# Patient Record
Sex: Female | Born: 1935 | Race: White | Hispanic: No | Smoking: Never smoker
Health system: Southern US, Community
[De-identification: ages and names within clinical notes are randomized; demographics above are authoritative.]

## PROBLEM LIST (undated history)

## (undated) DIAGNOSIS — IMO0002 Reserved for concepts with insufficient information to code with codable children: Secondary | ICD-10-CM

## (undated) DIAGNOSIS — F419 Anxiety disorder, unspecified: Secondary | ICD-10-CM

## (undated) DIAGNOSIS — G479 Sleep disorder, unspecified: Secondary | ICD-10-CM

## (undated) DIAGNOSIS — K5900 Constipation, unspecified: Secondary | ICD-10-CM

## (undated) DIAGNOSIS — K219 Gastro-esophageal reflux disease without esophagitis: Secondary | ICD-10-CM

## (undated) DIAGNOSIS — F209 Schizophrenia, unspecified: Secondary | ICD-10-CM

## (undated) DIAGNOSIS — N838 Other noninflammatory disorders of ovary, fallopian tube and broad ligament: Secondary | ICD-10-CM

## (undated) DIAGNOSIS — K900 Celiac disease: Secondary | ICD-10-CM

## (undated) DIAGNOSIS — I499 Cardiac arrhythmia, unspecified: Secondary | ICD-10-CM

## (undated) DIAGNOSIS — I10 Essential (primary) hypertension: Secondary | ICD-10-CM

## (undated) DIAGNOSIS — F0390 Unspecified dementia without behavioral disturbance: Secondary | ICD-10-CM

## (undated) DIAGNOSIS — F54 Psychological and behavioral factors associated with disorders or diseases classified elsewhere: Secondary | ICD-10-CM

## (undated) DIAGNOSIS — E039 Hypothyroidism, unspecified: Secondary | ICD-10-CM

## (undated) DIAGNOSIS — M8580 Other specified disorders of bone density and structure, unspecified site: Secondary | ICD-10-CM

## (undated) DIAGNOSIS — R631 Polydipsia: Secondary | ICD-10-CM

## (undated) HISTORY — DX: Essential (primary) hypertension: I10

## (undated) HISTORY — PX: VERTEBROPLASTY: SHX113

## (undated) HISTORY — DX: Celiac disease: K90.0

## (undated) HISTORY — DX: Reserved for concepts with insufficient information to code with codable children: IMO0002

## (undated) HISTORY — PX: OTHER SURGICAL HISTORY: SHX169

## (undated) HISTORY — DX: Anxiety disorder, unspecified: F41.9

## (undated) HISTORY — DX: Other specified disorders of bone density and structure, unspecified site: M85.80

## (undated) HISTORY — PX: THYROIDECTOMY: SHX17

---

## 2001-06-14 ENCOUNTER — Emergency Department (HOSPITAL_COMMUNITY): Admission: EM | Admit: 2001-06-14 | Discharge: 2001-06-14 | Payer: Self-pay | Admitting: Internal Medicine

## 2001-06-14 ENCOUNTER — Encounter: Payer: Self-pay | Admitting: *Deleted

## 2005-09-29 ENCOUNTER — Emergency Department (HOSPITAL_COMMUNITY): Admission: EM | Admit: 2005-09-29 | Discharge: 2005-09-29 | Payer: Self-pay | Admitting: Emergency Medicine

## 2005-10-02 ENCOUNTER — Ambulatory Visit (HOSPITAL_COMMUNITY): Admission: RE | Admit: 2005-10-02 | Discharge: 2005-10-02 | Payer: Self-pay | Admitting: Family Medicine

## 2005-10-03 ENCOUNTER — Encounter (INDEPENDENT_AMBULATORY_CARE_PROVIDER_SITE_OTHER): Payer: Self-pay | Admitting: *Deleted

## 2005-10-03 ENCOUNTER — Ambulatory Visit (HOSPITAL_COMMUNITY): Admission: RE | Admit: 2005-10-03 | Discharge: 2005-10-03 | Payer: Self-pay | Admitting: Interventional Radiology

## 2005-11-04 ENCOUNTER — Encounter: Payer: Self-pay | Admitting: Interventional Radiology

## 2009-04-13 ENCOUNTER — Ambulatory Visit (HOSPITAL_COMMUNITY): Admission: RE | Admit: 2009-04-13 | Discharge: 2009-04-13 | Payer: Self-pay | Admitting: Internal Medicine

## 2010-11-06 ENCOUNTER — Emergency Department (HOSPITAL_COMMUNITY): Admission: EM | Admit: 2010-11-06 | Discharge: 2010-11-06 | Payer: Self-pay | Admitting: Emergency Medicine

## 2011-03-05 ENCOUNTER — Emergency Department (HOSPITAL_COMMUNITY): Payer: Medicare Other

## 2011-03-05 ENCOUNTER — Emergency Department (HOSPITAL_COMMUNITY)
Admission: EM | Admit: 2011-03-05 | Discharge: 2011-03-05 | Disposition: A | Discharge status: Home / Self Care | Payer: Medicare Other | Attending: Emergency Medicine | Admitting: Emergency Medicine

## 2011-03-05 DIAGNOSIS — Y92410 Unspecified street and highway as the place of occurrence of the external cause: Secondary | ICD-10-CM | POA: Insufficient documentation

## 2011-03-05 DIAGNOSIS — Z79899 Other long term (current) drug therapy: Secondary | ICD-10-CM | POA: Insufficient documentation

## 2011-03-05 DIAGNOSIS — S42293A Other displaced fracture of upper end of unspecified humerus, initial encounter for closed fracture: Secondary | ICD-10-CM | POA: Insufficient documentation

## 2011-03-05 DIAGNOSIS — E039 Hypothyroidism, unspecified: Secondary | ICD-10-CM | POA: Insufficient documentation

## 2011-03-05 DIAGNOSIS — I10 Essential (primary) hypertension: Secondary | ICD-10-CM | POA: Insufficient documentation

## 2011-03-05 DIAGNOSIS — W1809XA Striking against other object with subsequent fall, initial encounter: Secondary | ICD-10-CM | POA: Insufficient documentation

## 2011-03-07 ENCOUNTER — Encounter: Payer: Self-pay | Admitting: Orthopedic Surgery

## 2011-03-07 ENCOUNTER — Ambulatory Visit (INDEPENDENT_AMBULATORY_CARE_PROVIDER_SITE_OTHER): Payer: Medicare Other | Admitting: Orthopedic Surgery

## 2011-03-07 VITALS — HR 78 | Temp 98.7°F | Resp 16

## 2011-03-07 DIAGNOSIS — S42213A Unspecified displaced fracture of surgical neck of unspecified humerus, initial encounter for closed fracture: Secondary | ICD-10-CM

## 2011-03-07 NOTE — Patient Instructions (Signed)
Wear sling until you come back

## 2011-03-08 ENCOUNTER — Encounter: Payer: Self-pay | Admitting: Orthopedic Surgery

## 2011-03-08 NOTE — Progress Notes (Signed)
C/o right shoulder pain   77 yo female fell while walking, c/o mild pain in the right shoulder which is non radiating and described as dull ache  Past Medical History  Diagnosis Date  . Thyroid disease   . HTN (hypertension)   . Reflux   . Anxiety   . Osteopenia     Past Surgical History  Procedure Date  . Left shoulder   . Right hip replacement   . Thyroidectomy   . Vertebroplasty     Family History  Problem Relation Age of Onset  . Cancer    . Kidney disease       General: The patient is normally developed, with normal grooming and hygiene. There are no gross deformities. The body habitus is small CDV: The pulse and perfusion of the extremities are normal   LYMPH: There is no gross lymphadenopathy in the extremities   Skin: There are no rashes, ulcers or cafe-au-lait spot   Psyche: The patient is alert, awake and oriented.  Mood is normal   Neuro:  The coordination and balance are normal.  Sensation is normal. Reflexes are 2+ and equal   Musculoskeletal   Right upper extremity Tender prox humerus  Swelling prox hum   Ecchymosis skin prox hum   Elbow wrist and hand rom strength normal   xrays + CT ND proximal humerus fracture   PLAN sling x 2 weeks then xrays then PT

## 2011-03-12 ENCOUNTER — Other Ambulatory Visit: Payer: Self-pay | Admitting: *Deleted

## 2011-03-12 ENCOUNTER — Telehealth: Payer: Self-pay | Admitting: Internal Medicine

## 2011-03-12 DIAGNOSIS — R52 Pain, unspecified: Secondary | ICD-10-CM

## 2011-03-12 MED ORDER — HYDROCODONE-ACETAMINOPHEN 5-325 MG PO TABS: 0.5000 | ORAL_TABLET | ORAL | Status: DC | PRN

## 2011-03-12 NOTE — Telephone Encounter (Signed)
Forwarded to Dr.

## 2011-03-12 NOTE — Telephone Encounter (Signed)
Give 1/2 tab norco q4 hrs prn pain

## 2011-03-12 NOTE — Telephone Encounter (Addendum)
Daughter called in and states that they have weaned her off the Norco that the hospital had her on.  The patient did use Tylenol yesterday but woke up this morning and was hurting in her shoulder and knee.  Daughter wanted to know if something else could be called in for pain to [place].  Please call daughter at home at [phone] you may leave a message on her machine.

## 2011-03-12 NOTE — Telephone Encounter (Signed)
Called pain med into [facility]

## 2011-03-13 ENCOUNTER — Encounter: Payer: Self-pay | Admitting: Orthopedic Surgery

## 2011-03-26 ENCOUNTER — Ambulatory Visit (INDEPENDENT_AMBULATORY_CARE_PROVIDER_SITE_OTHER): Payer: Medicare Other | Admitting: Orthopedic Surgery

## 2011-03-26 DIAGNOSIS — S42309A Unspecified fracture of shaft of humerus, unspecified arm, initial encounter for closed fracture: Secondary | ICD-10-CM

## 2011-03-26 NOTE — Progress Notes (Signed)
F/U fracture right proximal humerus   DOI 03/05/2011  X-rays due   Treatment sling   Subjectives: "It feels better"   Exam: The hand and wrist function is normal and the radial pulse is normal   X-rays show healed fracture proximal humerus   Start PT   F/u in 8 weeks

## 2011-04-01 ENCOUNTER — Ambulatory Visit (HOSPITAL_COMMUNITY)
Admission: RE | Admit: 2011-04-01 | Discharge: 2011-04-01 | Disposition: A | Discharge status: Home / Self Care | Payer: Medicare Other | Source: Ambulatory Visit | Attending: Specialist | Admitting: Specialist

## 2011-04-01 DIAGNOSIS — M25519 Pain in unspecified shoulder: Secondary | ICD-10-CM | POA: Insufficient documentation

## 2011-04-01 DIAGNOSIS — I10 Essential (primary) hypertension: Secondary | ICD-10-CM | POA: Insufficient documentation

## 2011-04-01 DIAGNOSIS — M25619 Stiffness of unspecified shoulder, not elsewhere classified: Secondary | ICD-10-CM | POA: Insufficient documentation

## 2011-04-01 DIAGNOSIS — IMO0001 Reserved for inherently not codable concepts without codable children: Secondary | ICD-10-CM | POA: Insufficient documentation

## 2011-04-01 DIAGNOSIS — M6281 Muscle weakness (generalized): Secondary | ICD-10-CM | POA: Insufficient documentation

## 2011-04-05 ENCOUNTER — Ambulatory Visit (HOSPITAL_COMMUNITY)
Admission: RE | Admit: 2011-04-05 | Discharge: 2011-04-05 | Disposition: A | Discharge status: Home / Self Care | Payer: Medicare Other | Source: Ambulatory Visit | Attending: Internal Medicine | Admitting: Internal Medicine

## 2011-04-09 ENCOUNTER — Ambulatory Visit (HOSPITAL_COMMUNITY)
Admission: RE | Admit: 2011-04-09 | Discharge: 2011-04-09 | Disposition: A | Discharge status: Home / Self Care | Payer: Medicare Other | Source: Ambulatory Visit | Attending: Internal Medicine | Admitting: Internal Medicine

## 2011-04-09 DIAGNOSIS — M25519 Pain in unspecified shoulder: Secondary | ICD-10-CM | POA: Insufficient documentation

## 2011-04-09 DIAGNOSIS — IMO0001 Reserved for inherently not codable concepts without codable children: Secondary | ICD-10-CM | POA: Insufficient documentation

## 2011-04-09 DIAGNOSIS — M6281 Muscle weakness (generalized): Secondary | ICD-10-CM | POA: Insufficient documentation

## 2011-04-09 DIAGNOSIS — I10 Essential (primary) hypertension: Secondary | ICD-10-CM | POA: Insufficient documentation

## 2011-04-09 DIAGNOSIS — M25619 Stiffness of unspecified shoulder, not elsewhere classified: Secondary | ICD-10-CM | POA: Insufficient documentation

## 2011-04-11 ENCOUNTER — Ambulatory Visit (HOSPITAL_COMMUNITY)
Admission: RE | Admit: 2011-04-11 | Discharge: 2011-04-11 | Disposition: A | Discharge status: Home / Self Care | Payer: Medicare Other | Source: Ambulatory Visit | Attending: Internal Medicine | Admitting: Internal Medicine

## 2011-04-18 ENCOUNTER — Ambulatory Visit (HOSPITAL_COMMUNITY)
Admission: RE | Admit: 2011-04-18 | Discharge: 2011-04-18 | Disposition: A | Discharge status: Home / Self Care | Payer: Medicare Other | Source: Ambulatory Visit | Attending: Internal Medicine | Admitting: Internal Medicine

## 2011-04-19 ENCOUNTER — Ambulatory Visit (HOSPITAL_COMMUNITY)
Admission: RE | Admit: 2011-04-19 | Discharge: 2011-04-19 | Disposition: A | Discharge status: Home / Self Care | Payer: Medicare Other | Source: Ambulatory Visit | Attending: Internal Medicine | Admitting: Internal Medicine

## 2011-04-23 ENCOUNTER — Ambulatory Visit (HOSPITAL_COMMUNITY)
Admission: RE | Admit: 2011-04-23 | Discharge: 2011-04-23 | Disposition: A | Discharge status: Home / Self Care | Payer: Medicare Other | Source: Ambulatory Visit | Attending: Internal Medicine | Admitting: Internal Medicine

## 2011-04-25 ENCOUNTER — Ambulatory Visit (HOSPITAL_COMMUNITY)
Admission: RE | Admit: 2011-04-25 | Discharge: 2011-04-25 | Disposition: A | Discharge status: Home / Self Care | Payer: Medicare Other | Source: Ambulatory Visit | Attending: Internal Medicine | Admitting: Internal Medicine

## 2011-04-26 NOTE — Consult Note (Signed)
Felicia Harvey, Felicia Harvey NO.:  [id number]   MEDICAL RECORD NO.:  [id number]          PATIENT TYPE:  OUT   LOCATION:  XRAY                         FACILITY:  [facility]   PHYSICIAN:  Sanjeev K. Deveshwar, M.D.DATE OF BIRTH:  13-Jul-1929   DATE OF CONSULTATION:  11/04/2005  DATE OF DISCHARGE:                                   CONSULTATION   BRIEF HISTORY:  This is a very pleasant 77 year old female who lives in  [place] but was here in [place] visiting her daughter. She fell while  vacuuming and developed an L1 compression fracture. She was referred to Dr.  Corliss Skains for further evaluation and treatment. A CT scan was performed. Dr.  Corliss Skains reviewed the CT scan and felt that the patient had an L1  compression fracture that would be amendable to kyphoplasty. The patient  underwent the kyphoplasty procedure at the L1 level performed on October 03, 2005 for relief of pain and stabilization of the fracture. The patient  tolerated the procedure well. The biopsy report was negative for malignancy.  The patient returns today, November 04, 2005 to be seen in follow-up prior  to returning to [place].   PAST MEDICAL HISTORY:  Significant for hypertension, irregular heart rate,  hypothyroidism, depression and history of headaches.   SURGICAL HISTORY:  Significant for thyroid surgery, left shoulder surgery,  left hip replacement surgery, multiple fractures including her sternum and  clavicle.   ALLERGIES:  No known drug allergies.   CURRENT MEDICATIONS:  Paxil, lisinopril, Synthroid, Cardizem and aspirin or  Tylenol p.r.n. for pain. She previously was taking hydrocodone for pain.   SOCIAL HISTORY:  The patient is divorced. She has three children. She lives  in [place], [place]. Her son lives with her. She has never used alcohol or  tobacco. 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   FAMILY HISTORY:  Mother died in her 30s from  unknown causes. Her father died  in his 59s from kidney disease.   IMPRESSION AND PLAN:  As noted, this patient returns approximately one month  following a kyphoplasty at the lumbar vertebrae-1 level performed by Dr.  Corliss Skains. She is not having any significant pain in her back at all at this  time. She does have some stiffness and pain in her legs. She takes p.r.n.  Tylenol or aspirin which does relieve the pain. She ambulates with a cane at  times although at other times she is able to ambulate independently. She  feels her mobility is back to normal. She was cautioned not to lift any  heavy objects and to avoid bending, stooping and  twisting as much as possible to prevent further compression fractures. She  is doing very well at this time and plans to return to [place] tomorrow. Her  wounds have healed without difficulty.   Approximately 15 minutes was spent on this consult today.      Delton See, P.A.    ______________________________  Grandville Silos. Corliss Skains, M.D.    DR/MEDQ  D:  11/04/2005  T:  11/05/2005  Job:  [id number]   cc:   Quita Skye. Artis Flock, M.D.  Fax: [phone]   Ruby Cola, M.D.  [place], [place]

## 2011-04-26 NOTE — Consult Note (Signed)
NAMEJOSE, ALLEYNE NO.:  [id number]   MEDICAL RECORD NO.:  [id number]          PATIENT TYPE:  OUT   LOCATION:  CATS                         FACILITY:  [facility]   PHYSICIAN:  Delton See, P.A.   DATE OF BIRTH:  December 04, 1929   DATE OF CONSULTATION:  10/02/2005  DATE OF DISCHARGE:                                   CONSULTATION   CHIEF COMPLAINT:  Compression fracture.   BRIEF HISTORY:  This is a 77 year old female who lives in [place] but is here  in [place], [place], visiting her daughters.  She fell  approximately three weeks ago at her daughters home while vacuuming.  She  states that she just tripped and fell.  She developed back pain.  She was  seen at the [facility] Room on September 29, 2005, and had spine  films that day that revealed an L1 compression fracture along with scoliosis  and MRI was recommended.  The patient was referred to Dr. Corliss Skains for  further evaluation, she was seen today for that consultation.  She describes  pain that radiates down her right leg and across her low back.  She had a CT  scan performed today in radiology.  She is not able to have an MRI due to a  prosthetic hip joint.  The CT scan confirmed the L1 compression fracture.   PAST MEDICAL HISTORY:  Significant for hypertension, history of an irregular  heart rhythm, history of hypothyroidism, history of depression.  She had  headaches earlier this year for about three days that subsequently resolved.   PAST SURGICAL HISTORY:  Significant for thyroid surgery, left shoulder  surgery, left hip replacement surgery.  She has also had multiple fractures  including her sternum and clavicle.   ALLERGIES:  No known drug allergies.   CURRENT MEDICATIONS:  Paxil 10 mg 1/2 tablet daily, Lisinopril 40 mg daily,  Synthroid 100 mcg daily, Cardizem CD 240 mg daily, hydrocodone p.r.n. for  pain.   SOCIAL HISTORY:  The patient is divorced, she has three children.  She  lives  in [place], [place].  Her son lives with her.  She has never used alcohol or  tobacco.  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   FAMILY HISTORY:  Her mother died in her 21s of unknown causes.  Her father  died in his 15s for kidney disease.   IMPRESSION:  1.  L1 compression fracture.  2.  Hypertension.  3.  History of irregular heart rate.  4.  Status post multiple surgeries as noted above.  5.  Status post multiple fractures.  6.  History of celiac disease.  7.  Hypothyroidism.  8.  History of depression.   PLAN:  Dr. Corliss Skains discussed the kyphoplasty with the patient and her  daughter who accompanied her to the visit today.  The details of the  procedure along with the risks and benefits were discussed.  The CT scan  results were reviewed.  The patient and her daughter are anxious  to proceed  for pain relief and stabilization of the fracture.  We will try to schedule  her procedure for tomorrow as the patient is anxious to return to [place].  Greater than 30 minutes was spent on this consult today.      Delton See, P.A.     DR/MEDQ  D:  10/02/2005  T:  10/02/2005  Job:  [id number]   cc:   Quita Skye. Artis Flock, M.D.  Fax: [phone]   Ruby Cola, M.D.  [place], [place]

## 2011-04-30 ENCOUNTER — Ambulatory Visit (HOSPITAL_COMMUNITY)
Admission: RE | Admit: 2011-04-30 | Discharge: 2011-04-30 | Disposition: A | Discharge status: Home / Self Care | Payer: Medicare Other | Source: Ambulatory Visit | Attending: Internal Medicine | Admitting: Internal Medicine

## 2011-05-02 ENCOUNTER — Ambulatory Visit (HOSPITAL_COMMUNITY)
Admission: RE | Admit: 2011-05-02 | Discharge: 2011-05-02 | Disposition: A | Discharge status: Home / Self Care | Payer: Medicare Other | Source: Ambulatory Visit | Attending: Internal Medicine | Admitting: Internal Medicine

## 2011-05-07 ENCOUNTER — Ambulatory Visit (HOSPITAL_COMMUNITY)
Admission: RE | Admit: 2011-05-07 | Discharge: 2011-05-07 | Disposition: A | Discharge status: Home / Self Care | Payer: Medicare Other | Source: Ambulatory Visit | Attending: Internal Medicine | Admitting: Internal Medicine

## 2011-05-09 ENCOUNTER — Ambulatory Visit (HOSPITAL_COMMUNITY)
Admission: RE | Admit: 2011-05-09 | Discharge: 2011-05-09 | Disposition: A | Discharge status: Home / Self Care | Payer: Medicare Other | Source: Ambulatory Visit | Attending: Internal Medicine | Admitting: Internal Medicine

## 2011-05-21 ENCOUNTER — Ambulatory Visit (INDEPENDENT_AMBULATORY_CARE_PROVIDER_SITE_OTHER): Payer: Medicare Other | Admitting: Orthopedic Surgery

## 2011-05-21 DIAGNOSIS — S42213A Unspecified displaced fracture of surgical neck of unspecified humerus, initial encounter for closed fracture: Secondary | ICD-10-CM

## 2011-05-21 NOTE — Progress Notes (Signed)
F/U fracture right proximal humerus  DOI 03/05/2011  She is completed physical therapy she still has limitations in her range of motion including activities of daily living however she does not want to continue any therapy because of the cost and she prefers to do her own home program She Limited in forward elevation to 90  So this point recommend she go ahead and continue exercising at home any problems she can come in for repeat evaluation

## 2011-09-20 ENCOUNTER — Other Ambulatory Visit: Payer: Self-pay

## 2011-09-20 ENCOUNTER — Encounter (HOSPITAL_COMMUNITY): Payer: Self-pay | Admitting: Family Medicine

## 2011-09-20 ENCOUNTER — Emergency Department (HOSPITAL_COMMUNITY): Payer: Medicare Other

## 2011-09-20 ENCOUNTER — Inpatient Hospital Stay (HOSPITAL_COMMUNITY)
Admission: EM | Admit: 2011-09-20 | Discharge: 2011-09-23 | DRG: 536 | Disposition: A | Discharge status: Home Health | Payer: Medicare Other | Attending: Internal Medicine | Admitting: Internal Medicine

## 2011-09-20 DIAGNOSIS — R51 Headache: Secondary | ICD-10-CM

## 2011-09-20 DIAGNOSIS — S32309A Unspecified fracture of unspecified ilium, initial encounter for closed fracture: Principal | ICD-10-CM | POA: Diagnosis present

## 2011-09-20 DIAGNOSIS — F54 Psychological and behavioral factors associated with disorders or diseases classified elsewhere: Secondary | ICD-10-CM

## 2011-09-20 DIAGNOSIS — R079 Chest pain, unspecified: Secondary | ICD-10-CM | POA: Diagnosis not present

## 2011-09-20 DIAGNOSIS — Y998 Other external cause status: Secondary | ICD-10-CM

## 2011-09-20 DIAGNOSIS — K5900 Constipation, unspecified: Secondary | ICD-10-CM | POA: Diagnosis present

## 2011-09-20 DIAGNOSIS — K219 Gastro-esophageal reflux disease without esophagitis: Secondary | ICD-10-CM

## 2011-09-20 DIAGNOSIS — F329 Major depressive disorder, single episode, unspecified: Secondary | ICD-10-CM | POA: Diagnosis present

## 2011-09-20 DIAGNOSIS — E222 Syndrome of inappropriate secretion of antidiuretic hormone: Secondary | ICD-10-CM | POA: Diagnosis present

## 2011-09-20 DIAGNOSIS — R52 Pain, unspecified: Secondary | ICD-10-CM

## 2011-09-20 DIAGNOSIS — R519 Headache, unspecified: Secondary | ICD-10-CM | POA: Diagnosis present

## 2011-09-20 DIAGNOSIS — I10 Essential (primary) hypertension: Secondary | ICD-10-CM

## 2011-09-20 DIAGNOSIS — T148XXA Other injury of unspecified body region, initial encounter: Secondary | ICD-10-CM

## 2011-09-20 DIAGNOSIS — D649 Anemia, unspecified: Secondary | ICD-10-CM

## 2011-09-20 DIAGNOSIS — S329XXA Fracture of unspecified parts of lumbosacral spine and pelvis, initial encounter for closed fracture: Secondary | ICD-10-CM

## 2011-09-20 DIAGNOSIS — R1310 Dysphagia, unspecified: Secondary | ICD-10-CM | POA: Diagnosis present

## 2011-09-20 DIAGNOSIS — Y9239 Other specified sports and athletic area as the place of occurrence of the external cause: Secondary | ICD-10-CM

## 2011-09-20 DIAGNOSIS — W010XXA Fall on same level from slipping, tripping and stumbling without subsequent striking against object, initial encounter: Secondary | ICD-10-CM | POA: Diagnosis present

## 2011-09-20 DIAGNOSIS — W19XXXA Unspecified fall, initial encounter: Secondary | ICD-10-CM

## 2011-09-20 DIAGNOSIS — F32A Depression, unspecified: Secondary | ICD-10-CM

## 2011-09-20 DIAGNOSIS — S72001A Fracture of unspecified part of neck of right femur, initial encounter for closed fracture: Secondary | ICD-10-CM | POA: Diagnosis present

## 2011-09-20 DIAGNOSIS — F3289 Other specified depressive episodes: Secondary | ICD-10-CM | POA: Diagnosis present

## 2011-09-20 DIAGNOSIS — J438 Other emphysema: Secondary | ICD-10-CM | POA: Diagnosis present

## 2011-09-20 DIAGNOSIS — E236 Other disorders of pituitary gland: Secondary | ICD-10-CM | POA: Diagnosis present

## 2011-09-20 DIAGNOSIS — E039 Hypothyroidism, unspecified: Secondary | ICD-10-CM | POA: Diagnosis present

## 2011-09-20 HISTORY — DX: Psychological and behavioral factors associated with disorders or diseases classified elsewhere: F54

## 2011-09-20 HISTORY — DX: Schizophrenia, unspecified: F20.9

## 2011-09-20 HISTORY — DX: Gastro-esophageal reflux disease without esophagitis: K21.9

## 2011-09-20 HISTORY — DX: Polydipsia: R63.1

## 2011-09-20 HISTORY — DX: Hypothyroidism, unspecified: E03.9

## 2011-09-20 LAB — CBC
Hemoglobin: 12.8 g/dL (ref 12.0–15.0)
MCH: 31.2 pg (ref 26.0–34.0)
MCV: 88 fL (ref 78.0–100.0)
RBC: 4.1 MIL/uL (ref 3.87–5.11)
RDW: 12.8 % (ref 11.5–15.5)
WBC: 9.3 10*3/uL (ref 4.0–10.5)

## 2011-09-20 LAB — BASIC METABOLIC PANEL
BUN: 11 mg/dL (ref 6–23)
CO2: 23 mEq/L (ref 19–32)
Calcium: 8.7 mg/dL (ref 8.4–10.5)
Chloride: 94 mEq/L — ABNORMAL LOW (ref 96–112)
Creatinine, Ser: 0.64 mg/dL (ref 0.50–1.10)
GFR calc Af Amer: >90 mL/min (ref >90)
GFR calc non Af Amer: 83 mL/min — ABNORMAL LOW (ref >90)
Glucose, Bld: 113 mg/dL — ABNORMAL HIGH (ref 70–99)
Potassium: 3.5 mEq/L (ref 3.5–5.1)

## 2011-09-20 LAB — DIFFERENTIAL
Basophils Absolute: 0 10*3/uL (ref 0.0–0.1)
Eosinophils Absolute: 0 10*3/uL (ref 0.0–0.7)
Eosinophils Relative: 0 % (ref 0–5)
Lymphocytes Relative: 12 % (ref 12–46)
Lymphs Abs: 1.1 10*3/uL (ref 0.7–4.0)
Monocytes Absolute: 0.6 10*3/uL (ref 0.1–1.0)
Monocytes Relative: 6 % (ref 3–12)
Neutro Abs: 7.6 10*3/uL (ref 1.7–7.7)
Neutrophils Relative %: 81 % — ABNORMAL HIGH (ref 43–77)

## 2011-09-20 LAB — TYPE AND SCREEN
ABO/RH(D): O POS
Antibody Screen: NEGATIVE

## 2011-09-20 MED ORDER — SODIUM CHLORIDE 0.9 % IV SOLN: 1000.0000 mL | INTRAVENOUS | Status: DC

## 2011-09-20 MED ORDER — FENTANYL CITRATE 0.05 MG/ML IJ SOLN
INTRAMUSCULAR | Status: AC
  Administered 2011-09-20: 100 ug
  Filled 2011-09-20: qty 2

## 2011-09-20 MED ORDER — FENTANYL CITRATE 0.05 MG/ML IJ SOLN
100.0000 ug | Freq: Once | INTRAMUSCULAR | Status: AC
  Administered 2011-09-20: 100 ug via INTRAVENOUS
  Filled 2011-09-20: qty 2

## 2011-09-20 MED ORDER — HYDROMORPHONE HCL 1 MG/ML IJ SOLN
1.0000 mg | Freq: Once | INTRAMUSCULAR | Status: AC
  Administered 2011-09-20: 1 mg via INTRAVENOUS
  Filled 2011-09-20: qty 1

## 2011-09-20 NOTE — ED Notes (Signed)
Pt walking in park, slid and fell on leaves and acorns.

## 2011-09-20 NOTE — H&P (Signed)
PCP:   Dwana Melena, MD   Chief Complaint:  Hip pain  HPI: This is a 77 y/o female who was walking in the park when she tripped and fell. It was a unwitnessed fall. She did hit her head, she had no LOC. She reports no chest pain, no palpitation prior to falling. No dizziness. She was found by her son and brought to the ER. The patient reports no cardiac history, she has never had an MI. She walks twice daily a bit over a mile each time. She ambulates using a cane.   She does report that she gets occasional chest pain with activity ~ every 2 weeks. She states she has had this for approximately 4 years. Pain is centrally located, sharp, no radiation. She also reports dysphagia. She states she has to cut her foods very small, especially meat in order to eat without issues. She has to drink carefully. She does get occasional heart burn. Her food can occasionally get hung in her throat. She has not followed up with her doctor about either of these. Her last stress test and surgery were ~ 6 years ago. History obtained from the patient and her son who are present at the bedside.  Review of Systems: (positives bolded) The patient denies anorexia, fever, weight loss,, vision loss, decreased hearing, hoarseness, chest pain, syncope, dyspnea on exertion, peripheral edema, balance deficits, hemoptysis, abdominal pain, melena, hematochezia, severe indigestion/heartburn, hematuria, incontinence, genital sores, muscle weakness, suspicious skin lesions, transient blindness, difficulty walking, depression, unusual weight change, abnormal bleeding, enlarged lymph nodes, angioedema, and breast masses.  Past Medical History: Past Medical History  Diagnosis Date  . Thyroid disease   . HTN (hypertension)   . Reflux   . Anxiety   . Osteopenia    Past Surgical History  Procedure Date  . Left shoulder   . Right hip replacement   . Thyroidectomy   . Vertebroplasty     Medications: Prior to Admission medications    Medication Sig Start Date End Date Taking? Authorizing Provider  Alendronate Sodium (FOSAMAX PO) Take 1 tablet by mouth once a week. On Fridays of each week    Yes Historical Provider, MD  aspirin 81 MG tablet Take 81 mg by mouth daily.     Yes Historical Provider, MD  DIAZEPAM PO Take by mouth at bedtime as needed.    Yes Historical Provider, MD  levothyroxine (SYNTHROID, LEVOTHROID) 112 MCG tablet Take 112 mcg by mouth daily.     Yes Historical Provider, MD  lisinopril (PRINIVIL,ZESTRIL) 40 MG tablet Take 40 mg by mouth daily.     Yes Historical Provider, MD  QUEtiapine (SEROQUEL) 100 MG tablet Take 100 mg by mouth at bedtime.     Yes Historical Provider, MD  acetaminophen (TYLENOL) 325 MG tablet Take 650 mg by mouth every 6 (six) hours as needed.      Historical Provider, MD  Calcium Carbonate Antacid (TUMS PO) Take by mouth.     Historical Provider, MD  HYDROcodone-acetaminophen (NORCO) 5-325 MG per tablet Take 0.5 tablets by mouth every 4 (four) hours as needed for pain. 03/12/11   Fuller Canada, MD  Multiple Vitamins-Minerals (ONE-A-DAY VITACRAVES PO) Take by mouth.      Historical Provider, MD  omeprazole (PRILOSEC) 20 MG capsule Take 20 mg by mouth daily.      Historical Provider, MD    Allergies:   Allergies  Allergen Reactions  . Gluten     Social History:  reports that she has  never smoked. She does not have any smokeless tobacco history on file. She reports that she does not drink alcohol. Her drug history not on file.  Family History: Family History  Problem Relation Age of Onset  . Cancer    . Kidney disease      Physical Exam: Filed Vitals:   09/20/11 2234 09/20/11 2236 09/20/11 2237 09/20/11 2300  BP: 145/79   140/76  Pulse:  78 79 85  Temp:      TempSrc:      Resp:      Height:      Weight:      SpO2:  97% 98% 98%    General:  Alert and oriented times three, well developed and nourished, no acute distress Eyes: PERRLA, pink conjunctiva, scleral  icterus ENT: Moist oral mucosa, neck supple, no thyromegaly Lungs: clear to ascultation, no wheeze, no crackles, no use of accessory muscles Cardiovascular: regular rate and rhythm, no regurgitation, no gallops, no murmurs. No carotid bruits, no JVD Abdomen: soft, positive BS, non-tender, none distended, no organomegaly, not an acute abdomen GU: not examined Neuro: CN II - XII grossly intact, sensation intact Musculoskeletal: strength 5/5 all extremities, no clubbing, cyanosis or edema. Right hip pain with activity Skin: no rash, no subcutaneous crepitation, no decubitus Psych: appropriate patient   Labs on Admission:   [facility] 09/20/11 1949  NA 129*  K 3.5  CL 94*  CO2 23  GLUCOSE 113*  BUN 11  CREATININE 0.64  CALCIUM 8.7  MG --  PHOS --   No results found for this basename: AST:2,ALT:2,ALKPHOS:2,BILITOT:2,PROT:2,ALBUMIN:2 in the last 72 hours No results found for this basename: LIPASE:2,AMYLASE:2 in the last 72 hours  Basename 09/20/11 1949  WBC 9.3  NEUTROABS 7.6  HGB 12.8  HCT 36.1  MCV 88.0  PLT 169   No results found for this basename: CKTOTAL:3,CKMB:3,CKMBINDEX:3,TROPONINI:3 in the last 72 hours No results found for this basename: TSH,T4TOTAL,FREET3,T3FREE,THYROIDAB in the last 72 hours No results found for this basename: VITAMINB12:2,FOLATE:2,FERRITIN:2,TIBC:2,IRON:2,RETICCTPCT:2 in the last 72 hours  Radiological Exams on Admission: Dg Chest 1 View  09/20/2011  *RADIOLOGY REPORT*  Clinical Data: Trauma.  Fall.  CHEST - 1 VIEW  Comparison: None.  Findings: Hyperexpansion is consistent with emphysema.  No evidence for pneumothorax. The cardiopericardial silhouette is enlarged. Bones are demineralized.  Old right clavicle fracture noted.  IMPRESSION: Emphysema with cardiomegaly.  Original Report Authenticated By: ERIC A. MANSELL, M.D.   Dg Hip Complete Right  09/20/2011  *RADIOLOGY REPORT*  Clinical Data: Fall.  Pain.  RIGHT HIP - COMPLETE 2+ VIEW  Comparison:  None.  Findings: Frontal pelvis shows marked bony demineralization. Diffuse gaseous bowel distention obscures fine bony detail over the sacrum and medial pelvis.  No acute fracture can be identified. The patient has a left hip hemiarthroplasty although the entire prosthesis is not been visualized.  IMPRESSION: Limited study without acute bony findings.  Original Report Authenticated By: ERIC A. MANSELL, M.D.   Ct Head Wo Contrast  09/20/2011  *RADIOLOGY REPORT*  Clinical Data: Larey Seat prior to arrival.  Dizziness, weakness, wearing C-collar.  CT HEAD WITHOUT CONTRAST  Technique:  Contiguous axial images were obtained from the base of the skull through the vertex without contrast.  Comparison: None.  Findings: Mild diffuse cerebral atrophy.  No mass effect or midline shift.  No abnormal extra-axial fluid collections.  Old lacunar infarct in the periventricular white matter on the left.  Scattered low attenuation changes in the deep white matter consistent  with small vessel ischemia.  Gray-white matter junctions are distinct. Basal cisterns are not effaced.  No evidence of acute intracranial hemorrhage.  Intracranial arterial vascular calcifications. Visualized paranasal sinuses are not opacified.  No depressed skull fractures.  IMPRESSION: Chronic atrophy and small vessel ischemic changes.  No evidence of acute intracranial hemorrhage, mass lesion, or acute infarct.  Original Report Authenticated By: Marlon Pel, M.D.   Ct Cervical Spine Wo Contrast  09/20/2011  *RADIOLOGY REPORT*  Clinical Data: Larey Seat prior to arrival.  Dizziness, weakness, wearing C-collar.  CT CERVICAL SPINE WITHOUT CONTRAST  Technique:  Multidetector CT imaging of the cervical spine was performed. Multiplanar CT image reconstructions were also generated.  Comparison: None.  Findings: Degenerative changes with narrowed C4-5, C5-6, and C6-7 interspaces with associated endplate hypertrophic changes.  No endplate destruction.  Normal  alignment of the cervical vertebrae, posterior elements, and facet joints.  No vertebral compression deformities.  No prevertebral soft tissue swelling.  Lateral masses of C1 are symmetrical.  The odontoid process is intact. Degenerative changes in the facet joints.  No significant paraspinal infiltration.  Vascular calcifications in the carotid arteries.  No significant cervical lymphadenopathy.  Calcification in the thyroid gland.  IMPRESSION: Degenerative changes in the cervical spine.  Normal alignment suggested.  No displaced fractures identified.  Original Report Authenticated By: Marlon Pel, M.D.   Ct Hip Right Wo Contrast  09/20/2011  *RADIOLOGY REPORT*  Clinical Data: Larey Seat today with right hip pain, worsening since earlier.  CT OF THE RIGHT HIP WITHOUT CONTRAST  Technique:  Multidetector CT imaging was performed according to the standard protocol. Multiplanar CT image reconstructions were also generated.  Comparison: Plain films 09/20/2011  Findings: There is a comminuted displaced fracture of the right anterior iliac wing without involvement of the sacroiliac joint or acetabulum.  There is associated hematoma in the soft tissues.  The right hip, and the superior and inferior right pubic rami appear intact.  IMPRESSION: Comminuted displaced fracture of the right anterior iliac wing.  Original Report Authenticated By: Marlon Pel, M.D.   EKG: normal sinus rythm   Assessment/Plan Present on Admission:  .Hip fracture, right -bedrest, DVT prophylaxis, pain management -consult ortho in AM. Chest pain -cardiac clearance -cardiac vs GI etiology -given the length of time of her chest pain and increased level of activity, I would be less inclined to believe its origin is cardiac in nature. Patient is also on fosamax. I will order an echo and continue her PPI. Defer to AM team further w/u. Hyponatremia -check TSH -will also check urine sodium. Plasma and urine osmolarity -continue  NS Hypertension Hypothyroidism Depression Headache -stable -resume home meds COPD (per imaging) -PRN nebs ordered -no h/o tobacco use   Code status: full code DVT prophylaxis Team 1  Sade Hollon 09/20/2011, 11:22 PM

## 2011-09-20 NOTE — ED Provider Notes (Signed)
History     CSN: [id number] Arrival date & time: 09/20/2011  7:21 PM  Chief Complaint  Patient presents with  . Hip Pain    (Consider location/radiation/quality/duration/timing/severity/associated sxs/prior treatment) HPI This 77 year old female was walking in the park just prior to arrival when she slipped on a corns on the ground and fell causing severe right hip pain. She also hit her head and has mild neck pain. There is no amnesia or loss of consciousness. She has no change in speech or vision. She has no localized weakness or numbness other than weakness to the right hip due to pain. She has no other injury and no radiation of the pain away from her right hip. She has no chest pain shortness of breath or abdominal pain. She does not have any lacerations. The pain is sharp and severe and constant without associated symptoms or prior treatment. Past Medical History  Diagnosis Date  . Thyroid disease   . HTN (hypertension)   . Reflux   . Anxiety   . Osteopenia   . GERD (gastroesophageal reflux disease)   . Hypothyroidism   . Emphysema 09/21/2011    Per CXR  . Dysphagia 09/21/2011  . Chest pain 09/21/2011  . Schizophrenia   . Polydipsia 09/21/2011  . 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 Left shoulder   . Right hip replacement   . Thyroidectomy   . Vertebroplasty   . Hip surgery Lt    Family History  Problem Relation Age of Onset  . Cancer    . Kidney disease      History  Substance Use Topics  . Smoking status: Never Smoker   . Smokeless tobacco: Not on file  . Alcohol Use: No    OB History    Grav Para Term Preterm Abortions TAB SAB Ect Mult Living                  Review of Systems  Constitutional: Negative for fever.       10 Systems reviewed and are negative for acute change except as noted in the HPI.  HENT: Negative for congestion.   Eyes: Negative for discharge and redness.  Respiratory: Negative for  cough and shortness of breath.   Cardiovascular: Negative for chest pain.  Gastrointestinal: Negative for vomiting and abdominal pain.  Musculoskeletal: Negative for back pain.  Skin: Negative for rash.  Neurological: Negative for syncope, numbness and headaches.  Psychiatric/Behavioral:       No behavior change.    Allergies  Gluten  Home Medications   Current Outpatient Rx  Name Route Sig Dispense Refill  . FOSAMAX PO Oral Take 1 tablet by mouth once a week. On Fridays of each week     . ASPIRIN 81 MG PO TABS Oral Take 81 mg by mouth daily.      Marland Kitchen DIAZEPAM PO Oral Take by mouth at bedtime as needed.     Marland Kitchen LEVOTHYROXINE SODIUM 112 MCG PO TABS Oral Take 112 mcg by mouth daily.      Marland Kitchen LISINOPRIL 40 MG PO TABS Oral Take 40 mg by mouth daily.      . QUETIAPINE FUMARATE 100 MG PO TABS Oral Take 100 mg by mouth at bedtime.      . ACETAMINOPHEN 325 MG PO TABS Oral Take 650 mg by mouth every 6 (six) hours as needed.      . TUMS PO Oral Take by mouth.     Marland Kitchen  HYDROCODONE-ACETAMINOPHEN 5-325 MG PO TABS Oral Take 0.5-2 tablets by mouth every 4 (four) hours as needed for pain. 30 tablet 0  . ONE-A-DAY VITACRAVES PO Oral Take by mouth.      . OMEPRAZOLE 20 MG PO CPDR Oral Take 20 mg by mouth daily.        BP 152/72  Pulse 93  Temp(Src) 98.4 F (36.9 C) (Oral)  Resp 18  Ht 5' (1.524 m)  Wt 108 lb 0.4 oz (49 kg)  BMI 21.10 kg/m2  SpO2 97%  Physical Exam  Nursing note and vitals reviewed. Constitutional:       Awake, alert, nontoxic appearance.  HENT:  Head: Atraumatic.  Eyes: Conjunctivae are normal. Pupils are equal, round, and reactive to light. Right eye exhibits no discharge. Left eye exhibits no discharge.  Neck:       Mild cervical and paracervical tenderness present  Cardiovascular: Normal rate and regular rhythm.   No murmur heard. Pulmonary/Chest: Effort normal and breath sounds normal. No respiratory distress. She has no wheezes. She has no rales. She exhibits no  tenderness.  Abdominal: Soft. Bowel sounds are normal. There is no tenderness. There is no rebound.  Musculoskeletal: She exhibits no tenderness.       Baseline ROM except limited right hip, no obvious new focal weakness.  Back nontender arms and left leg nontender right leg has tenderness to the hip only with no tenderness to the knee ankle or foot. Capillary refill is normal at less than 2 seconds in the right foot with normal light touch to the right foot. She moves the right foot well.  Neurological:       Mental status and motor strength appears baseline for patient and situation.  Skin: No rash noted.  Psychiatric: She has a normal mood and affect.    ED Course  Procedures (including critical care time)  The patient's pain was somewhat controlled in the emergency department but she is not able to sit up or stand and has to be admitted and need rehabilitation placement.  The case was discussed with [facility] hospitalists for admission who will likely consult Dr. Romeo Apple who has seen the patient in the past.  ECG: Sinus rhythm with first degree AV block with a ventricular rate 70 beats per minute, normal axis, PR interval is prolonged, no acute ischemic changes noted, compared with March 2012 her QT interval has shortened and is no longer prolonged Labs Reviewed  DIFFERENTIAL - Abnormal; Notable for the following:    Neutrophils Relative 81 (*)    All other components within normal limits  BASIC METABOLIC PANEL - Abnormal; Notable for the following:    Sodium 129 (*)    Chloride 94 (*)    Glucose, Bld 113 (*)    GFR calc non Af Amer 83 (*)    All other components within normal limits  BASIC METABOLIC PANEL - Abnormal; Notable for the following:    Sodium 127 (*)    Chloride 95 (*)    Glucose, Bld 126 (*)    GFR calc non Af Amer 85 (*)    All other components within normal limits  CBC - Abnormal; Notable for the following:    RBC 3.71 (*)    Hemoglobin 11.7 (*)    HCT 32.7 (*)     All other components within normal limits  OSMOLALITY, URINE - Abnormal; Notable for the following:    Osmolality, Ur 310 (*)    All other components within normal  limits  OSMOLALITY - Abnormal; Notable for the following:    Osmolality 262 (*)    All other components within normal limits  CBC - Abnormal; Notable for the following:    RBC 3.22 (*)    Hemoglobin 10.2 (*)    HCT 28.5 (*)    Platelets 124 (*)    All other components within normal limits  BASIC METABOLIC PANEL - Abnormal; Notable for the following:    Sodium 129 (*)    Calcium 7.8 (*)    GFR calc non Af Amer 85 (*)    All other components within normal limits  CBC - Abnormal; Notable for the following:    RBC 3.36 (*)    Hemoglobin 10.5 (*)    HCT 30.0 (*)    Platelets 125 (*)    All other components within normal limits  BASIC METABOLIC PANEL - Abnormal; Notable for the following:    Sodium 130 (*)    Calcium 8.2 (*)    GFR calc non Af Amer 82 (*)    All other components within normal limits  CBC  TYPE AND SCREEN  SODIUM, URINE, RANDOM  TSH  T4, FREE  LAB REPORT - SCANNED   No results found.   1. Pelvic fracture   2. Pain   3. Hypertension   4. Hypothyroidism   5. Depression   6. Headache   7. Anemia   8. Fall   9. Emphysema   10. Dysphagia   11. Chest pain   12. Psychogenic polydipsia   13. Hematoma   14. GERD (gastroesophageal reflux disease)       MDM          Hurman Horn, MD 09/24/11 2257

## 2011-09-21 ENCOUNTER — Encounter (HOSPITAL_COMMUNITY): Payer: Self-pay

## 2011-09-21 DIAGNOSIS — D649 Anemia, unspecified: Secondary | ICD-10-CM | POA: Diagnosis present

## 2011-09-21 DIAGNOSIS — E222 Syndrome of inappropriate secretion of antidiuretic hormone: Secondary | ICD-10-CM | POA: Diagnosis present

## 2011-09-21 DIAGNOSIS — R631 Polydipsia: Secondary | ICD-10-CM

## 2011-09-21 DIAGNOSIS — R079 Chest pain, unspecified: Secondary | ICD-10-CM | POA: Diagnosis not present

## 2011-09-21 DIAGNOSIS — F54 Psychological and behavioral factors associated with disorders or diseases classified elsewhere: Secondary | ICD-10-CM

## 2011-09-21 DIAGNOSIS — W19XXXA Unspecified fall, initial encounter: Secondary | ICD-10-CM | POA: Diagnosis present

## 2011-09-21 DIAGNOSIS — T148XXA Other injury of unspecified body region, initial encounter: Secondary | ICD-10-CM | POA: Diagnosis present

## 2011-09-21 DIAGNOSIS — R1310 Dysphagia, unspecified: Secondary | ICD-10-CM | POA: Diagnosis present

## 2011-09-21 DIAGNOSIS — K219 Gastro-esophageal reflux disease without esophagitis: Secondary | ICD-10-CM | POA: Diagnosis present

## 2011-09-21 DIAGNOSIS — S329XXA Fracture of unspecified parts of lumbosacral spine and pelvis, initial encounter for closed fracture: Secondary | ICD-10-CM | POA: Diagnosis present

## 2011-09-21 HISTORY — DX: Psychological and behavioral factors associated with disorders or diseases classified elsewhere: F54

## 2011-09-21 HISTORY — DX: Psychological and behavioral factors associated with disorders or diseases classified elsewhere: R63.1

## 2011-09-21 LAB — CBC
MCHC: 35.8 g/dL (ref 30.0–36.0)
RDW: 12.8 % (ref 11.5–15.5)

## 2011-09-21 LAB — BASIC METABOLIC PANEL
BUN: 11 mg/dL (ref 6–23)
Calcium: 8.7 mg/dL (ref 8.4–10.5)
Creatinine, Ser: 0.58 mg/dL (ref 0.50–1.10)
GFR calc Af Amer: >90 mL/min (ref >90)
GFR calc non Af Amer: 85 mL/min — ABNORMAL LOW (ref >90)

## 2011-09-21 LAB — SODIUM, URINE, RANDOM: Sodium, Ur: 57 mEq/L

## 2011-09-21 LAB — OSMOLALITY, URINE: Osmolality, Ur: 310 mOsm/kg — ABNORMAL LOW (ref 390–1090)

## 2011-09-21 MED ORDER — MORPHINE SULFATE 2 MG/ML IJ SOLN: 2.0000 mg | INTRAMUSCULAR | Status: DC | PRN

## 2011-09-21 MED ORDER — ALUM & MAG HYDROXIDE-SIMETH 200-200-20 MG/5ML PO SUSP: 30.0000 mL | Freq: Four times a day (QID) | ORAL | Status: DC | PRN

## 2011-09-21 MED ORDER — LISINOPRIL 10 MG PO TABS
40.0000 mg | ORAL_TABLET | Freq: Every day | ORAL | Status: DC
  Administered 2011-09-21 – 2011-09-23 (×3): 40 mg via ORAL
  Filled 2011-09-21: qty 3
  Filled 2011-09-21 (×2): qty 4
  Filled 2011-09-21: qty 1

## 2011-09-21 MED ORDER — ALBUTEROL SULFATE (5 MG/ML) 0.5% IN NEBU: 2.5000 mg | INHALATION_SOLUTION | RESPIRATORY_TRACT | Status: DC | PRN

## 2011-09-21 MED ORDER — QUETIAPINE FUMARATE 100 MG PO TABS
100.0000 mg | ORAL_TABLET | Freq: Every day | ORAL | Status: DC
  Administered 2011-09-21 – 2011-09-22 (×2): 100 mg via ORAL
  Filled 2011-09-21 (×2): qty 1

## 2011-09-21 MED ORDER — ACETAMINOPHEN 325 MG PO TABS
650.0000 mg | ORAL_TABLET | Freq: Four times a day (QID) | ORAL | Status: DC | PRN
  Administered 2011-09-22: 650 mg via ORAL
  Filled 2011-09-21: qty 2

## 2011-09-21 MED ORDER — ACETAMINOPHEN 650 MG RE SUPP: 650.0000 mg | Freq: Four times a day (QID) | RECTAL | Status: DC | PRN

## 2011-09-21 MED ORDER — ONDANSETRON HCL 4 MG/2ML IJ SOLN: 4.0000 mg | Freq: Four times a day (QID) | INTRAMUSCULAR | Status: DC | PRN

## 2011-09-21 MED ORDER — LEVOTHYROXINE SODIUM 112 MCG PO TABS
112.0000 ug | ORAL_TABLET | Freq: Every day | ORAL | Status: DC
  Administered 2011-09-21 – 2011-09-23 (×3): 112 ug via ORAL
  Filled 2011-09-21 (×5): qty 1

## 2011-09-21 MED ORDER — SENNOSIDES-DOCUSATE SODIUM 8.6-50 MG PO TABS
1.0000 | ORAL_TABLET | Freq: Every day | ORAL | Status: DC | PRN
  Administered 2011-09-22: 1 via ORAL
  Filled 2011-09-21: qty 1

## 2011-09-21 MED ORDER — ENOXAPARIN SODIUM 40 MG/0.4ML ~~LOC~~ SOLN
40.0000 mg | SUBCUTANEOUS | Status: DC
  Administered 2011-09-21: 40 mg via SUBCUTANEOUS
  Filled 2011-09-21: qty 0.4

## 2011-09-21 MED ORDER — PANTOPRAZOLE SODIUM 40 MG PO TBEC
40.0000 mg | DELAYED_RELEASE_TABLET | Freq: Every day | ORAL | Status: DC
  Administered 2011-09-21 – 2011-09-23 (×3): 40 mg via ORAL
  Filled 2011-09-21 (×3): qty 1

## 2011-09-21 MED ORDER — ZOLPIDEM TARTRATE 5 MG PO TABS: 5.0000 mg | ORAL_TABLET | Freq: Every evening | ORAL | Status: DC | PRN

## 2011-09-21 MED ORDER — ONDANSETRON HCL 4 MG PO TABS: 4.0000 mg | ORAL_TABLET | Freq: Four times a day (QID) | ORAL | Status: DC | PRN

## 2011-09-21 MED ORDER — SODIUM CHLORIDE 0.9 % IJ SOLN: 3.0000 mL | INTRAMUSCULAR | Status: DC | PRN

## 2011-09-21 MED ORDER — IPRATROPIUM BROMIDE 0.02 % IN SOLN: 0.5000 mg | RESPIRATORY_TRACT | Status: DC | PRN

## 2011-09-21 MED ORDER — POTASSIUM CHLORIDE IN NACL 20-0.9 MEQ/L-% IV SOLN
INTRAVENOUS | Status: DC
  Administered 2011-09-21 (×2): via INTRAVENOUS

## 2011-09-21 MED ORDER — HYDROCODONE-ACETAMINOPHEN 5-325 MG PO TABS
1.0000 | ORAL_TABLET | ORAL | Status: DC | PRN
  Administered 2011-09-21 – 2011-09-23 (×5): 1 via ORAL
  Filled 2011-09-21 (×6): qty 1

## 2011-09-21 NOTE — Progress Notes (Signed)
Subjective: The patient is currently lying in bed. Her son is in the room. Questions were answered. She has no complaints of right hip pain now, however, she does have pain when she tries to move in bed. She has no active complaints of chest pain or difficulty swallowing. Her headache is not quite as bad.  Objective: Vital signs in last 24 hours: Filed Vitals:   09/21/11 0026 09/21/11 0631 09/21/11 0751 09/21/11 0809  BP: 166/70 163/71    Pulse: 69 74  75  Temp: 97.9 F (36.6 C) 98.4 F (36.9 C)    TempSrc: Oral Oral    Resp: 18 18  17   Height:      Weight:      SpO2: 91% 97% 98% 97%    Intake/Output Summary (Last 24 hours) at 09/21/11 1038 Last data filed at 09/21/11 0600  Gross per 24 hour  Intake    360 ml  Output      0 ml  Net    360 ml    Weight change:   Exam: General: The patient is a pleasant 77 year old woman who is lying in bed, in no acute distress. Lungs: Clear anteriorly. Breathing is nonlabored. Heart: S1, S2, with a soft systolic murmur. Abdomen: Positive bowel sounds, soft, nondistended, nontender. Extremities: Moderate tenderness over the lateral right hip with associated edema. She is able to wiggle her toes on her right foot. She is unable to lift her leg secondary to pain. No right sided pedal edema. She is able to lift her left leg without any difficulty. No left pedal edema. Pedal pulses barely palpable bilaterally. Neurologic/psychological: The patient is alert and oriented to herself, her son, and place. She has a very pleasant affect. Her speech is clear and not pressured. Cranial nerves II through XII are grossly intact. She denies hallucinations.  Lab Results: Basic Metabolic Panel:  Basename 09/21/11 0516 09/20/11 1949  NA 127* 129*  K 4.2 3.5  CL 95* 94*  CO2 22 23  GLUCOSE 126* 113*  BUN 11 11  CREATININE 0.58 0.64  CALCIUM 8.7 8.7  MG -- --  PHOS -- --   Liver Function Tests: No results found for this basename:  AST:2,ALT:2,ALKPHOS:2,BILITOT:2,PROT:2,ALBUMIN:2 in the last 72 hours No results found for this basename: LIPASE:2,AMYLASE:2 in the last 72 hours No results found for this basename: AMMONIA:2 in the last 72 hours CBC:  Basename 09/21/11 0516 09/20/11 1949  WBC 9.4 9.3  NEUTROABS -- 7.6  HGB 11.7* 12.8  HCT 32.7* 36.1  MCV 88.1 88.0  PLT 162 169   Cardiac Enzymes: No results found for this basename: CKTOTAL:3,CKMB:3,CKMBINDEX:3,TROPONINI:3 in the last 72 hours BNP: No results found for this basename: POCBNP:3 in the last 72 hours D-Dimer: No results found for this basename: DDIMER:2 in the last 72 hours CBG: No results found for this basename: GLUCAP:6 in the last 72 hours Hemoglobin A1C: No results found for this basename: HGBA1C in the last 72 hours Fasting Lipid Panel: No results found for this basename: CHOL,HDL,LDLCALC,TRIG,CHOLHDL,LDLDIRECT in the last 72 hours Thyroid Function Tests: No results found for this basename: TSH,T4TOTAL,FREET4,T3FREE,THYROIDAB in the last 72 hours Anemia Panel: No results found for this basename: VITAMINB12,FOLATE,FERRITIN,TIBC,IRON,RETICCTPCT in the last 72 hours Urine Drug Screen:  Alcohol Level: No results found for this basename: ETH:2 in the last 72 hours Urinalysis:  Misc. Labs:   Micro: No results found for this or any previous visit (from the past 240 hour(s)).  Studies/Results: Dg Chest 1 View  09/20/2011  *  RADIOLOGY REPORT*  Clinical Data: Trauma.  Fall.  CHEST - 1 VIEW  Comparison: None.  Findings: Hyperexpansion is consistent with emphysema.  No evidence for pneumothorax. The cardiopericardial silhouette is enlarged. Bones are demineralized.  Old right clavicle fracture noted.  IMPRESSION: Emphysema with cardiomegaly.  Original Report Authenticated By: ERIC A. MANSELL, M.D.   Dg Hip Complete Right  09/20/2011  *RADIOLOGY REPORT*  Clinical Data: Fall.  Pain.  RIGHT HIP - COMPLETE 2+ VIEW  Comparison: None.  Findings: Frontal  pelvis shows marked bony demineralization. Diffuse gaseous bowel distention obscures fine bony detail over the sacrum and medial pelvis.  No acute fracture can be identified. The patient has a left hip hemiarthroplasty although the entire prosthesis is not been visualized.  IMPRESSION: Limited study without acute bony findings.  Original Report Authenticated By: ERIC A. MANSELL, M.D.   Ct Head Wo Contrast  09/20/2011  *RADIOLOGY REPORT*  Clinical Data: Larey Seat prior to arrival.  Dizziness, weakness, wearing C-collar.  CT HEAD WITHOUT CONTRAST  Technique:  Contiguous axial images were obtained from the base of the skull through the vertex without contrast.  Comparison: None.  Findings: Mild diffuse cerebral atrophy.  No mass effect or midline shift.  No abnormal extra-axial fluid collections.  Old lacunar infarct in the periventricular white matter on the left.  Scattered low attenuation changes in the deep white matter consistent with small vessel ischemia.  Gray-white matter junctions are distinct. Basal cisterns are not effaced.  No evidence of acute intracranial hemorrhage.  Intracranial arterial vascular calcifications. Visualized paranasal sinuses are not opacified.  No depressed skull fractures.  IMPRESSION: Chronic atrophy and small vessel ischemic changes.  No evidence of acute intracranial hemorrhage, mass lesion, or acute infarct.  Original Report Authenticated By: Marlon Pel, M.D.   Ct Cervical Spine Wo Contrast  09/20/2011  *RADIOLOGY REPORT*  Clinical Data: Larey Seat prior to arrival.  Dizziness, weakness, wearing C-collar.  CT CERVICAL SPINE WITHOUT CONTRAST  Technique:  Multidetector CT imaging of the cervical spine was performed. Multiplanar CT image reconstructions were also generated.  Comparison: None.  Findings: Degenerative changes with narrowed C4-5, C5-6, and C6-7 interspaces with associated endplate hypertrophic changes.  No endplate destruction.  Normal alignment of the cervical  vertebrae, posterior elements, and facet joints.  No vertebral compression deformities.  No prevertebral soft tissue swelling.  Lateral masses of C1 are symmetrical.  The odontoid process is intact. Degenerative changes in the facet joints.  No significant paraspinal infiltration.  Vascular calcifications in the carotid arteries.  No significant cervical lymphadenopathy.  Calcification in the thyroid gland.  IMPRESSION: Degenerative changes in the cervical spine.  Normal alignment suggested.  No displaced fractures identified.  Original Report Authenticated By: Marlon Pel, M.D.   Ct Hip Right Wo Contrast  09/20/2011  *RADIOLOGY REPORT*  Clinical Data: Larey Seat today with right hip pain, worsening since earlier.  CT OF THE RIGHT HIP WITHOUT CONTRAST  Technique:  Multidetector CT imaging was performed according to the standard protocol. Multiplanar CT image reconstructions were also generated.  Comparison: Plain films 09/20/2011  Findings: There is a comminuted displaced fracture of the right anterior iliac wing without involvement of the sacroiliac joint or acetabulum.  There is associated hematoma in the soft tissues.  The right hip, and the superior and inferior right pubic rami appear intact.  IMPRESSION: Comminuted displaced fracture of the right anterior iliac wing.  Original Report Authenticated By: Marlon Pel, M.D.    Medications: I have reviewed the patient's  current medications.  Assessment: Active Problems:  Hypertension  Hypothyroidism  Depression  Headache  Anemia  Fall  Pelvic fracture  Emphysema  Dysphagia  Chest pain  Psychogenic polydipsia  1. Status post fall with resultant comminuted displaced fracture of the right anterior iliac wing. There is associated hematoma in the soft tissues. There is no obvious right hip fracture radiographically. The patient says that she tripped and fell. She did hit her head but there was no loss of consciousness. She will likely need  rehabilitation; both the patient and her son agreed.  Hyponatremia. Her urine sodium is 57. Other studies will need to be ordered for the etiology. I discussed fluid intake with the patient and her son. Apparently, the patient drinks water all day and sometimes all night. Therefore, it is likely that she has psychogenic polydipsia.  Hypertension. She is treated chronically with lisinopril.  Chest pain. This is chronic and not necessarily active. Her chest pain, per her description, appears to be more associated with dysphagia and gastroesophageal reflux disease. A 2-D echocardiogram has been ordered.  Chronic dysphagia and associated gastroesophageal reflux disease. She has a history of esophageal dilatation in the past. Currently, she is tolerating her current diet without difficulty. Protonix was started; she takes omeprazole chronically.  History of schizophrenia. I did ask the patient why she was taking Seroquel. She stated that it was for sleep. I asked her son, and he told me, that she was given a diagnosis of schizophrenia many years ago.  Hypothyroidism. She is treated chronically with replacement therapy. TSH is pending.   Plan:  1. Will decrease her IV fluids to [facility]. Will start a fluid restriction of 1500 cc daily. We'll check a uric acid level. Urine osmolality, serum osmolality, and TSH are pending. Will discontinue heart healthy diet and liberalize sodium in her diet. We'll also change to dysphagia 3 so she can tolerate it better if needed.  Consult the physical therapist and occupational therapist. We'll consult the social worker as well as the patient will likely need skilled nursing facility placement short-term for rehabilitation.  Continue analgesics for pain.  Discontinue Lovenox because of the hematoma to avoid worsening bleeding. Will add SCDs for DVT prophylaxis.     LOS: 1 day   Felicia Harvey 09/21/2011, 10:38 AM

## 2011-09-21 NOTE — Consult Note (Signed)
Reason for Consult:Fracture pelvis  Referring Physician: Hospitalist  Felicia Harvey is an 77 y.o. female.  HPI: She fell and hurt her right hip and pelvis.  Xrays of the hips were negative for fracture. She has a prosthesis on the left hip cemented in place with no loosening or fracture.  CT scan of the pelvis shows a small comminuted fracture of the right iliac wing.  She has pain in this area.  She had no head injury.    Her pain is controlled.  Her vitals remain stable.    Past Medical History  Diagnosis Date  . Thyroid disease   . HTN (hypertension)   . Reflux   . Anxiety   . Osteopenia   . GERD (gastroesophageal reflux disease)   . Hypothyroidism   . Emphysema 09/21/2011    Per CXR  . Dysphagia 09/21/2011  . Chest pain 09/21/2011  . Schizophrenia   . Polydipsia 09/21/2011  . 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 Left shoulder   . Right hip replacement   . Thyroidectomy   . Vertebroplasty   . Hip surgery Lt    Family History  Problem Relation Age of Onset  . Cancer    . Kidney disease      Social History:  reports that she has never smoked. She does not have any smokeless tobacco history on file. She reports that she does not drink alcohol or use illicit drugs.  Allergies:  Allergies  Allergen Reactions  . Gluten     Medications: I have reviewed the patient's current medications.  Results for orders placed during the hospital encounter of 09/20/11 (from the past 48 hour(s))  CBC     Status: Normal   Collection Time   09/20/11  7:49 PM      Component Value Range Comment   WBC 9.3  4.0 - 10.5 (K/uL)    RBC 4.10  3.87 - 5.11 (MIL/uL)    Hemoglobin 12.8  12.0 - 15.0 (g/dL)    HCT 62.1  30.8 - 65.7 (%)    MCV 88.0  78.0 - 100.0 (fL)    MCH 31.2  26.0 - 34.0 (pg)    MCHC 35.5  30.0 - 36.0 (g/dL)    RDW 84.6  96.2 - 95.2 (%)    Platelets 169  150 - 400 (K/uL)   DIFFERENTIAL     Status: Abnormal   Collection Time     09/20/11  7:49 PM      Component Value Range Comment   Neutrophils Relative 81 (*) 43 - 77 (%)    Neutro Abs 7.6  1.7 - 7.7 (K/uL)    Lymphocytes Relative 12  12 - 46 (%)    Lymphs Abs 1.1  0.7 - 4.0 (K/uL)    Monocytes Relative 6  3 - 12 (%)    Monocytes Absolute 0.6  0.1 - 1.0 (K/uL)    Eosinophils Relative 0  0 - 5 (%)    Eosinophils Absolute 0.0  0.0 - 0.7 (K/uL)    Basophils Relative 0  0 - 1 (%)    Basophils Absolute 0.0  0.0 - 0.1 (K/uL)   BASIC METABOLIC PANEL     Status: Abnormal   Collection Time   09/20/11  7:49 PM      Component Value Range Comment   Sodium 129 (*) 135 - 145 (mEq/L)    Potassium 3.5  3.5 - 5.1 (mEq/L)  Chloride 94 (*) 96 - 112 (mEq/L)    CO2 23  19 - 32 (mEq/L)    Glucose, Bld 113 (*) 70 - 99 (mg/dL)    BUN 11  6 - 23 (mg/dL)    Creatinine, Ser 4.09  0.50 - 1.10 (mg/dL)    Calcium 8.7  8.4 - 10.5 (mg/dL)    GFR calc non Af Amer 83 (*) >90 (mL/min)    GFR calc Af Amer >90  >90 (mL/min)   TYPE AND SCREEN     Status: Normal   Collection Time   09/20/11  7:49 PM      Component Value Range Comment   ABO/RH(D) O POS      Antibody Screen NEG      Sample Expiration 09/23/2011     SODIUM, URINE, RANDOM     Status: Normal   Collection Time   09/21/11  1:21 AM      Component Value Range Comment   Sodium, Ur 57     BASIC METABOLIC PANEL     Status: Abnormal   Collection Time   09/21/11  5:16 AM      Component Value Range Comment   Sodium 127 (*) 135 - 145 (mEq/L)    Potassium 4.2  3.5 - 5.1 (mEq/L)    Chloride 95 (*) 96 - 112 (mEq/L)    CO2 22  19 - 32 (mEq/L)    Glucose, Bld 126 (*) 70 - 99 (mg/dL)    BUN 11  6 - 23 (mg/dL)    Creatinine, Ser 8.11  0.50 - 1.10 (mg/dL)    Calcium 8.7  8.4 - 10.5 (mg/dL)    GFR calc non Af Amer 85 (*) >90 (mL/min)    GFR calc Af Amer >90  >90 (mL/min)   CBC     Status: Abnormal   Collection Time   09/21/11  5:16 AM      Component Value Range Comment   WBC 9.4  4.0 - 10.5 (K/uL)    RBC 3.71 (*) 3.87 - 5.11  (MIL/uL)    Hemoglobin 11.7 (*) 12.0 - 15.0 (g/dL)    HCT 91.4 (*) 78.2 - 46.0 (%)    MCV 88.1  78.0 - 100.0 (fL)    MCH 31.5  26.0 - 34.0 (pg)    MCHC 35.8  30.0 - 36.0 (g/dL)    RDW 95.6  21.3 - 08.6 (%)    Platelets 162  150 - 400 (K/uL)     Dg Chest 1 View  09/20/2011  *RADIOLOGY REPORT*  Clinical Data: Trauma.  Fall.  CHEST - 1 VIEW  Comparison: None.  Findings: Hyperexpansion is consistent with emphysema.  No evidence for pneumothorax. The cardiopericardial silhouette is enlarged. Bones are demineralized.  Old right clavicle fracture noted.  IMPRESSION: Emphysema with cardiomegaly.  Original Report Authenticated By: ERIC A. MANSELL, M.D.   Dg Hip Complete Right  09/20/2011  *RADIOLOGY REPORT*  Clinical Data: Fall.  Pain.  RIGHT HIP - COMPLETE 2+ VIEW  Comparison: None.  Findings: Frontal pelvis shows marked bony demineralization. Diffuse gaseous bowel distention obscures fine bony detail over the sacrum and medial pelvis.  No acute fracture can be identified. The patient has a left hip hemiarthroplasty although the entire prosthesis is not been visualized.  IMPRESSION: Limited study without acute bony findings.  Original Report Authenticated By: ERIC A. MANSELL, M.D.   Ct Head Wo Contrast  09/20/2011  *RADIOLOGY REPORT*  Clinical Data: Larey Seat prior to arrival.  Dizziness, weakness,  wearing C-collar.  CT HEAD WITHOUT CONTRAST  Technique:  Contiguous axial images were obtained from the base of the skull through the vertex without contrast.  Comparison: None.  Findings: Mild diffuse cerebral atrophy.  No mass effect or midline shift.  No abnormal extra-axial fluid collections.  Old lacunar infarct in the periventricular white matter on the left.  Scattered low attenuation changes in the deep white matter consistent with small vessel ischemia.  Gray-white matter junctions are distinct. Basal cisterns are not effaced.  No evidence of acute intracranial hemorrhage.  Intracranial arterial vascular  calcifications. Visualized paranasal sinuses are not opacified.  No depressed skull fractures.  IMPRESSION: Chronic atrophy and small vessel ischemic changes.  No evidence of acute intracranial hemorrhage, mass lesion, or acute infarct.  Original Report Authenticated By: Marlon Pel, M.D.   Ct Cervical Spine Wo Contrast  09/20/2011  *RADIOLOGY REPORT*  Clinical Data: Larey Seat prior to arrival.  Dizziness, weakness, wearing C-collar.  CT CERVICAL SPINE WITHOUT CONTRAST  Technique:  Multidetector CT imaging of the cervical spine was performed. Multiplanar CT image reconstructions were also generated.  Comparison: None.  Findings: Degenerative changes with narrowed C4-5, C5-6, and C6-7 interspaces with associated endplate hypertrophic changes.  No endplate destruction.  Normal alignment of the cervical vertebrae, posterior elements, and facet joints.  No vertebral compression deformities.  No prevertebral soft tissue swelling.  Lateral masses of C1 are symmetrical.  The odontoid process is intact. Degenerative changes in the facet joints.  No significant paraspinal infiltration.  Vascular calcifications in the carotid arteries.  No significant cervical lymphadenopathy.  Calcification in the thyroid gland.  IMPRESSION: Degenerative changes in the cervical spine.  Normal alignment suggested.  No displaced fractures identified.  Original Report Authenticated By: Marlon Pel, M.D.   Ct Hip Right Wo Contrast  09/20/2011  *RADIOLOGY REPORT*  Clinical Data: Larey Seat today with right hip pain, worsening since earlier.  CT OF THE RIGHT HIP WITHOUT CONTRAST  Technique:  Multidetector CT imaging was performed according to the standard protocol. Multiplanar CT image reconstructions were also generated.  Comparison: Plain films 09/20/2011  Findings: There is a comminuted displaced fracture of the right anterior iliac wing without involvement of the sacroiliac joint or acetabulum.  There is associated hematoma in the  soft tissues.  The right hip, and the superior and inferior right pubic rami appear intact.  IMPRESSION: Comminuted displaced fracture of the right anterior iliac wing.  Original Report Authenticated By: Marlon Pel, M.D.    Review of Systems  Constitutional: Negative.   Eyes: Negative.   Respiratory: Shortness of breath: History of COPD.   Cardiovascular: Chest pain: history of chest pain.  Gastrointestinal: Heartburn: History of dysphagia.  Genitourinary: Negative.   Musculoskeletal: Positive for joint pain (right hip pain) and falls.  Skin: Negative.   Neurological: Negative.   Endo/Heme/Allergies: Negative.   Psychiatric/Behavioral: Depression: History of depression.   Blood pressure 163/71, pulse 75, temperature 98.4 F (36.9 C), temperature source Oral, resp. rate 17, height 5' (1.524 m), weight 49 kg (108 lb 0.4 oz), SpO2 97.00%. Physical Exam  Constitutional: She is oriented to person, place, and time. She appears well-developed and well-nourished.  HENT:  Head: Normocephalic and atraumatic.  Eyes: Conjunctivae are normal. Pupils are equal, round, and reactive to light.  Neck: Normal range of motion. Neck supple.  Cardiovascular: Normal rate, regular rhythm and normal heart sounds.   Respiratory: Effort normal and breath sounds normal.  GI: Soft.  Musculoskeletal: She exhibits tenderness (right  anterior pelvis and right hip area).       Right hip: She exhibits decreased range of motion, decreased strength, tenderness and bony tenderness.       Legs: Neurological: She is alert and oriented to person, place, and time. She has normal reflexes.  Skin: Skin is warm and dry.  Psychiatric: She has a normal mood and affect. Her behavior is normal. Judgment and thought content normal.    Assessment/Plan: Fracture of the right anterior pelvic wing, comminuted, not displaced.  This does not need surgery. It should heal slowly over the next six to eight weeks.  Modify  activity. Status post left hip prosthesis with no new fracture.  Plan:  Physical therapy, use of a walker.  Ambulate as tolerated.  Check CBC tomorrow.  Zarina Pe 09/21/2011, 12:18 PM

## 2011-09-22 LAB — CBC
MCH: 31.7 pg (ref 26.0–34.0)
MCV: 88.5 fL (ref 78.0–100.0)
Platelets: 124 10*3/uL — ABNORMAL LOW (ref 150–400)
RDW: 13.1 % (ref 11.5–15.5)

## 2011-09-22 LAB — BASIC METABOLIC PANEL
BUN: 9 mg/dL (ref 6–23)
CO2: 21 mEq/L (ref 19–32)
Calcium: 7.8 mg/dL — ABNORMAL LOW (ref 8.4–10.5)
Creatinine, Ser: 0.59 mg/dL (ref 0.50–1.10)
GFR calc Af Amer: >90 mL/min (ref >90)

## 2011-09-22 MED ORDER — DOCUSATE SODIUM 100 MG PO CAPS
100.0000 mg | ORAL_CAPSULE | Freq: Two times a day (BID) | ORAL | Status: DC
  Administered 2011-09-22 – 2011-09-23 (×3): 100 mg via ORAL
  Filled 2011-09-22 (×3): qty 1

## 2011-09-22 NOTE — Progress Notes (Signed)
Chart reviewed.  Subjective: Complaining of hip pain and constipation. No other complaints.  Objective: Vital signs in last 24 hours: Filed Vitals:   09/21/11 1400 09/21/11 2212 09/22/11 0613 09/22/11 1033  BP: 156/69 146/76 99/62 105/71  Pulse: 72 85 78 96  Temp: 98.2 F (36.8 C) 98.9 F (37.2 C) 98.1 F (36.7 C)   TempSrc: Oral Oral Oral   Resp: 18 18 16    Height:      Weight:      SpO2: 98% 97% 92%    Weight change:   Intake/Output Summary (Last 24 hours) at 09/22/11 1319 Last data filed at 09/21/11 1800  Gross per 24 hour  Intake    120 ml  Output   1000 ml  Net   -880 ml   General: Comfortable in chair. Lungs clear to auscultation bilaterally without wheeze rhonchi or rales Cardiovascular regular rate rhythm without murmurs gallops rubs Abdomen soft nontender nondistended Extremities no clubbing cyanosis or edema. She does have right hip tenderness  Lab Results: Basic Metabolic Panel:  Lab 09/22/11 0454 09/21/11 0516  NA 129* 127*  K 3.7 4.2  CL 101 95*  CO2 21 22  GLUCOSE 83 126*  BUN 9 11  CREATININE 0.59 0.58  CALCIUM 7.8* 8.7  MG -- --  PHOS -- --   Liver Function Tests: No results found for this basename: AST:2,ALT:2,ALKPHOS:2,BILITOT:2,PROT:2,ALBUMIN:2 in the last 168 hours No results found for this basename: LIPASE:2,AMYLASE:2 in the last 168 hours No results found for this basename: AMMONIA:2 in the last 168 hours CBC:  Lab 09/22/11 0625 09/21/11 0516 09/20/11 1949  WBC 4.3 9.4 --  NEUTROABS -- -- 7.6  HGB 10.2* 11.7* --  HCT 28.5* 32.7* --  MCV 88.5 88.1 --  PLT 124* 162 --   Cardiac Enzymes: No results found for this basename: CKTOTAL:3,CKMB:3,CKMBINDEX:3,TROPONINI:3 in the last 168 hours BNP: No results found for this basename: POCBNP:3 in the last 168 hours D-Dimer: No results found for this basename: DDIMER:2 in the last 168 hours CBG: No results found for this basename: GLUCAP:6 in the last 168 hours Hemoglobin A1C: No  results found for this basename: HGBA1C in the last 168 hours Fasting Lipid Panel: No results found for this basename: CHOL,HDL,LDLCALC,TRIG,CHOLHDL,LDLDIRECT in the last 098 hours Thyroid Function Tests:  Lab 09/21/11 0516  TSH 0.663  T4TOTAL --  FREET4 --  T3FREE --  THYROIDAB --   Urine osmolality 310. Urine sodium 57. Serum osmolality 262 which is low.  Anemia Panel: No results found for this basename: VITAMINB12,FOLATE,FERRITIN,TIBC,IRON,RETICCTPCT in the last 168 hours  Micro Results: No results found for this or any previous visit (from the past 240 hour(s)). Studies/Results: Dg Chest 1 View  09/20/2011  *RADIOLOGY REPORT*  Clinical Data: Trauma.  Fall.  CHEST - 1 VIEW  Comparison: None.  Findings: Hyperexpansion is consistent with emphysema.  No evidence for pneumothorax. The cardiopericardial silhouette is enlarged. Bones are demineralized.  Old right clavicle fracture noted.  IMPRESSION: Emphysema with cardiomegaly.  Original Report Authenticated By: ERIC A. MANSELL, M.D.   Dg Hip Complete Right  09/20/2011  *RADIOLOGY REPORT*  Clinical Data: Fall.  Pain.  RIGHT HIP - COMPLETE 2+ VIEW  Comparison: None.  Findings: Frontal pelvis shows marked bony demineralization. Diffuse gaseous bowel distention obscures fine bony detail over the sacrum and medial pelvis.  No acute fracture can be identified. The patient has a left hip hemiarthroplasty although the entire prosthesis is not been visualized.  IMPRESSION: Limited study without acute  bony findings.  Original Report Authenticated By: ERIC A. MANSELL, M.D.   Ct Head Wo Contrast  09/20/2011  *RADIOLOGY REPORT*  Clinical Data: Larey Seat prior to arrival.  Dizziness, weakness, wearing C-collar.  CT HEAD WITHOUT CONTRAST  Technique:  Contiguous axial images were obtained from the base of the skull through the vertex without contrast.  Comparison: None.  Findings: Mild diffuse cerebral atrophy.  No mass effect or midline shift.  No abnormal  extra-axial fluid collections.  Old lacunar infarct in the periventricular white matter on the left.  Scattered low attenuation changes in the deep white matter consistent with small vessel ischemia.  Gray-white matter junctions are distinct. Basal cisterns are not effaced.  No evidence of acute intracranial hemorrhage.  Intracranial arterial vascular calcifications. Visualized paranasal sinuses are not opacified.  No depressed skull fractures.  IMPRESSION: Chronic atrophy and small vessel ischemic changes.  No evidence of acute intracranial hemorrhage, mass lesion, or acute infarct.  Original Report Authenticated By: Marlon Pel, M.D.   Ct Cervical Spine Wo Contrast  09/20/2011  *RADIOLOGY REPORT*  Clinical Data: Larey Seat prior to arrival.  Dizziness, weakness, wearing C-collar.  CT CERVICAL SPINE WITHOUT CONTRAST  Technique:  Multidetector CT imaging of the cervical spine was performed. Multiplanar CT image reconstructions were also generated.  Comparison: None.  Findings: Degenerative changes with narrowed C4-5, C5-6, and C6-7 interspaces with associated endplate hypertrophic changes.  No endplate destruction.  Normal alignment of the cervical vertebrae, posterior elements, and facet joints.  No vertebral compression deformities.  No prevertebral soft tissue swelling.  Lateral masses of C1 are symmetrical.  The odontoid process is intact. Degenerative changes in the facet joints.  No significant paraspinal infiltration.  Vascular calcifications in the carotid arteries.  No significant cervical lymphadenopathy.  Calcification in the thyroid gland.  IMPRESSION: Degenerative changes in the cervical spine.  Normal alignment suggested.  No displaced fractures identified.  Original Report Authenticated By: Marlon Pel, M.D.   Ct Hip Right Wo Contrast  09/20/2011  *RADIOLOGY REPORT*  Clinical Data: Larey Seat today with right hip pain, worsening since earlier.  CT OF THE RIGHT HIP WITHOUT CONTRAST   Technique:  Multidetector CT imaging was performed according to the standard protocol. Multiplanar CT image reconstructions were also generated.  Comparison: Plain films 09/20/2011  Findings: There is a comminuted displaced fracture of the right anterior iliac wing without involvement of the sacroiliac joint or acetabulum.  There is associated hematoma in the soft tissues.  The right hip, and the superior and inferior right pubic rami appear intact.  IMPRESSION: Comminuted displaced fracture of the right anterior iliac wing.  Original Report Authenticated By: Marlon Pel, M.D.    Scheduled Meds:   . levothyroxine  112 mcg Oral Q breakfast  . lisinopril  40 mg Oral Daily  . pantoprazole  40 mg Oral Q1200  . QUEtiapine  100 mg Oral QHS   Continuous Infusions:   . 0.9 % NaCl with KCl 20 mEq / Harvey 10 mL/hr at 09/21/11 1116   PRN Meds:.acetaminophen, acetaminophen, albuterol, alum & mag hydroxide-simeth, HYDROcodone-acetaminophen, ipratropium, morphine injection, ondansetron (ZOFRAN) IV, ondansetron, senna-docusate, sodium chloride, zolpidem Assessment/Plan: Principal Problem:  *Pelvic fracture Active Problems:  Hypertension  Hypothyroidism  Anemia: Monitor.  Dysphagia Hyponatremia  GERD (gastroesophageal reflux disease)  Depression  Headache  Fall  Emphysema  Chest pain  Hematoma Constipation  Will start laxatives and stool softeners for constipation. Urine and serum studies consistent with SIADH. Continue fluid restriction. TSH is normal. Patient  is agreeable to skilled nursing facility placement. Will consult social worker.   LOS: 2 days   Felicia Harvey 09/22/2011, 1:19 PM

## 2011-09-22 NOTE — Progress Notes (Signed)
Physical Therapy Evaluation Patient Details Name: Felicia Harvey MRN: [id number] DOB: August 12, 1929 Today's Date: 09/22/2011  Problem List:  Patient Active Problem List  Diagnoses  . Hypertension  . Hypothyroidism  . Depression  . Headache  . Anemia  . Fall  . Pelvic fracture  . Emphysema  . Dysphagia  . Chest pain  . Psychogenic polydipsia  . Hematoma  . GERD (gastroesophageal reflux disease)    Past Medical History:  Past Medical History  Diagnosis Date  . Thyroid disease   . HTN (hypertension)   . Reflux   . Anxiety   . Osteopenia   . GERD (gastroesophageal reflux disease)   . Hypothyroidism   . Emphysema 09/21/2011    Per CXR  . Dysphagia 09/21/2011  . Chest pain 09/21/2011  . Schizophrenia   . Polydipsia 09/21/2011  . 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 Left shoulder   . Right hip replacement   . Thyroidectomy   . Vertebroplasty   . Hip surgery Lt    PT Assessment/Plan/Recommendation PT Assessment Clinical Impression Statement: very pleasant pt limited functionally by acute R pelvic pain with any mobility.  Had been very active PTA and should progress well with rehab.  Recommend SNF at d/c--Pt and son agree PT Recommendation/Assessment: Patient will need skilled PT in the acute care venue PT Problem List: Decreased strength;Decreased range of motion;Decreased activity tolerance;Decreased balance;Decreased mobility;Decreased knowledge of use of DME;Decreased safety awareness;Decreased knowledge of precautions;Pain Problem List Comments: pt is very astute and grasps teaching well Barriers to Discharge: None PT Therapy Diagnosis : Difficulty walking;Abnormality of gait;Generalized weakness;Acute pain PT Plan PT Frequency: Min 6X/week PT Treatment/Interventions: DME instruction;Gait training;Functional mobility training;Therapeutic exercise PT Recommendation Recommendations for Other  Services: OT consult Follow Up Recommendations: Skilled nursing facility Equipment Recommended: Defer to next venue PT Goals  Acute Rehab PT Goals PT Goal Formulation: With patient Time For Goal Achievement: 2 weeks Pt will go Supine/Side to Sit: with mod assist;with HOB not 0 degrees (comment degree) (30 deg) Pt will go Sit to Supine/Side: with mod assist Pt will Transfer Sit to Stand/Stand to Sit: with min assist Pt will Transfer Bed to Chair/Chair to Bed: with min assist Pt will Ambulate: 16 - 50 feet;with min assist;with rolling walker  PT Evaluation Precautions/Restrictions  Precautions Required Braces or Orthoses: No Restrictions Weight Bearing Restrictions: No (WBAT RLE) Prior Functioning  Home Living Type of Home: House Lives With: Sheran Spine Help From: Family Home Layout: One level Home Access: Stairs to enter Entrance Stairs-Rails: Right Entrance Stairs-Number of Steps: 4 Bathroom Shower/Tub: Engineer, manufacturing systems: Standard Bathroom Accessibility: Yes How Accessible: Accessible via walker Home Adaptive Equipment: Hand-held shower hose;Shower chair with back;Walker - rolling;Straight cane Prior Function Level of Independence: Independent with basic ADLs Vocation: Retired Financial risk analyst Arousal/Alertness: Awake/alert Overall Cognitive Status: Appears within functional limits for tasks assessed Sensation/Coordination Sensation Light Touch: Appears Intact Extremity Assessment RUE Assessment RUE Assessment: Exceptions to [facility] RUE PROM (degrees) Right Shoulder Flexion  0-170: 40 Degrees Right Shoulder ABduction 0-140: 20 Degrees LUE Assessment LUE Assessment: Within Functional Limits RLE Assessment RLE Assessment: Exceptions to [facility] RLE Strength RLE Overall Strength: Deficits;Due to pain LLE Assessment LLE Assessment: Within Functional Limits Mobility (including Balance) Bed Mobility Bed Mobility: Yes Supine to Sit: 2: Max assist;HOB  elevated (Comment degrees) (60 deg) Supine to Sit Details (indicate cue type and reason): needs assist to abduct RLE, cues for using UEs to  assist Sitting - Scoot to Edge of Bed: 2: Max assist Transfers Transfers: Yes Sit to Stand: 3: Mod assist Sit to Stand Details (indicate cue type and reason): cues to use arms to assist Stand to Sit: 4: Min assist Stand Pivot Transfers: With armrests Stand Pivot Transfer Details (indicate cue type and reason): bed to [facility] transfer done-pt very anxious to develop proficiency--doesn't like bedpan Ambulation/Gait Ambulation/Gait: Yes Ambulation/Gait Assistance: 3: Mod assist Ambulation/Gait Assistance Details (indicate cue type and reason): pt has difficulty off-loading weight from LLE to step Ambulation Distance (Feet): 2 Feet Assistive device: Rolling walker Gait Pattern: Decreased stride length;Decreased stance time - right;Decreased hip/knee flexion - left;Antalgic (maintains very narrow base of support) Gait velocity: very slow, labored Stairs: No Wheelchair Mobility Wheelchair Mobility: No  Posture/Postural Control Posture/Postural Control:  (severe thoracic kyphosis) Balance Balance Assessed: No Exercise  Total Joint Exercises Ankle Circles/Pumps: AROM;Both;10 reps;Supine Quad Sets: AROM;Right;10 reps;Supine Gluteal Sets: AROM;Both;10 reps;Supine Short Arc Quad: AROM;Left;10 reps;Supine Heel Slides: AAROM;Both;5 reps;Supine Hip ABduction/ADduction: AAROM;Both;5 reps;Supine General Exercises - Lower Extremity Ankle Circles/Pumps: AROM;Both;10 reps;Supine Quad Sets: AROM;Right;10 reps;Supine Gluteal Sets: AROM;Both;10 reps;Supine Short Arc Quad: AROM;Left;10 reps;Supine Heel Slides: AAROM;Both;5 reps;Supine Hip ABduction/ADduction: AAROM;Both;5 reps;Supine Low Level/ICU Exercises Ankle Circles/Pumps: AROM;Both;10 reps;Supine Quad Sets: AROM;Right;10 reps;Supine Short Arc Quad: AROM;Left;10 reps;Supine Hip ABduction/ADduction:  AAROM;Both;5 reps;Supine Heel Slides: AAROM;Both;5 reps;Supine Amputee Exercises Quad Sets: AROM;Right;10 reps;Supine Gluteal Sets: AROM;Both;10 reps;Supine Hip ABduction/ADduction: AAROM;Both;5 reps;Supine Antenatal Exercises Ankle Circles/Pumps: AROM;Both;10 reps;Supine Quad Sets: AROM;Right;10 reps;Supine Gluteal Sets: AROM;Both;10 reps;Supine Short Arc Quad: AROM;Left;10 reps;Supine Hip ABduction/ADduction: AAROM;Both;5 reps;Supine End of Session PT - End of Session Equipment Utilized During Treatment: Gait belt Activity Tolerance: Patient tolerated treatment well Patient left: in chair;with call bell in reach Nurse Communication: Mobility status for transfers;Mobility status for ambulation General Behavior During Session: [facility] for tasks performed Cognition: [facility] for tasks performed  Konrad Penta 09/22/2011, 10:55 AM

## 2011-09-22 NOTE — Progress Notes (Signed)
She is afebrile and vital signs normal.  Pain is controlled.  She is out of bed sitting in chair.  She tolerated the transfer well.  Continue present course.  Continue assistance bed to chair.  Advance to walker, weight bearing as tolerated.

## 2011-09-22 NOTE — Patient Instructions (Signed)
Pt instructed in moving slowly to help control pain, also instructed in weight bearing status.  Instructed in transfer technique bed to [facility] and in gait with walker.

## 2011-09-23 DIAGNOSIS — I359 Nonrheumatic aortic valve disorder, unspecified: Secondary | ICD-10-CM

## 2011-09-23 LAB — BASIC METABOLIC PANEL
BUN: 11 mg/dL (ref 6–23)
Calcium: 8.2 mg/dL — ABNORMAL LOW (ref 8.4–10.5)
Creatinine, Ser: 0.66 mg/dL (ref 0.50–1.10)
GFR calc Af Amer: >90 mL/min (ref >90)
GFR calc non Af Amer: 82 mL/min — ABNORMAL LOW (ref >90)
Potassium: 3.6 mEq/L (ref 3.5–5.1)

## 2011-09-23 LAB — CBC
HCT: 30 % — ABNORMAL LOW (ref 36.0–46.0)
MCH: 31.3 pg (ref 26.0–34.0)
MCHC: 35 g/dL (ref 30.0–36.0)
RDW: 13.2 % (ref 11.5–15.5)

## 2011-09-23 LAB — T4, FREE: Free T4: 1.43 ng/dL (ref 0.80–1.80)

## 2011-09-23 MED ORDER — HYDROCODONE-ACETAMINOPHEN 5-325 MG PO TABS: 0.5000 | ORAL_TABLET | ORAL | Status: DC | PRN

## 2011-09-23 NOTE — Consult Note (Signed)
Pt requires ambulance transport home due to steps into home.  CSW arranged [facility] transport.  Pt's son will be at home upon arrival.  Karn Cassis

## 2011-09-23 NOTE — Consult Note (Signed)
Pt is observation status per CM.  CSW spoke with pt about SNF as she will not have qualifying 3 day stay for Medicare.  Pt reports she cannot afford to pay privately and plans to go home with home health.  CM notified for needs and MD also aware.  CSW to sign off as no other apparent needs.   Karn Cassis

## 2011-09-23 NOTE — Progress Notes (Signed)
*  PRELIMINARY RESULTS* Echocardiogram 2D Echocardiogram has been performed.  Felicia Harvey Jane 09/23/2011, 11:31 AM 

## 2011-09-23 NOTE — Progress Notes (Signed)
Pt d/c home arranged home health PT, aide and sw. Gave daughter [employer] for aides. Ptd  states she has a walker, ordered 3 in 1 bsc. Daughter wanted  pt placed but patient was adamant about going home.

## 2011-09-23 NOTE — Discharge Summary (Signed)
Physician Discharge Summary  Patient ID: Felicia Harvey MRN: [id number] DOB/AGE: 75/07/1929 75 y.o.  Admit date: 09/20/2011 Discharge date: 09/23/2011  Discharge Diagnoses:  Principal Problem:  *Pelvic fracture Active Problems:  Hypertension  Hypothyroidism  Anemia  Dysphagia  SIADH (syndrome of inappropriate ADH production)  GERD (gastroesophageal reflux disease)  Depression  Headache  Fall  Emphysema  Chest pain  Hematoma   Current Discharge Medication List    CONTINUE these medications which have CHANGED   Details  HYDROcodone-acetaminophen (NORCO) 5-325 MG per tablet Take 0.5-2 tablets by mouth every 4 (four) hours as needed for pain. Qty: 30 tablet, Refills: 0   Associated Diagnoses: Pain      CONTINUE these medications which have NOT CHANGED   Details  Alendronate Sodium (FOSAMAX PO) Take 1 tablet by mouth once a week. On Fridays of each week     aspirin 81 MG tablet Take 81 mg by mouth daily.      DIAZEPAM PO Take by mouth at bedtime as needed.     levothyroxine (SYNTHROID, LEVOTHROID) 112 MCG tablet Take 112 mcg by mouth daily.      lisinopril (PRINIVIL,ZESTRIL) 40 MG tablet Take 40 mg by mouth daily.      QUEtiapine (SEROQUEL) 100 MG tablet Take 100 mg by mouth at bedtime.      acetaminophen (TYLENOL) 325 MG tablet Take 650 mg by mouth every 6 (six) hours as needed.      Calcium Carbonate Antacid (TUMS PO) Take by mouth.     Multiple Vitamins-Minerals (ONE-A-DAY VITACRAVES PO) Take by mouth.      omeprazole (PRILOSEC) 20 MG capsule Take 20 mg by mouth daily.          Discharge Orders    Future Orders Please Complete By Expires   Dan Humphreys       Walk with assistance      Discharge instructions      Comments:   May take stool softeners and laxative of choice as needed.  3 strict fluid to less than 1500 cc daily      Follow-up Information    Follow up with HALL,ZACK. (as previously scheduled to monitor sodium level)    Contact information:   [address] [place] [address] [phone]  Follow up echo results.      Follow up with KEELING,WAYNE in 2 months.   Contact information:   [address] [place] [address] [phone]         Disposition: Home or Self Care with home PT  Discharged Condition: stable  Consults: Treatment Team:  [facility]:   Results for orders placed during the hospital encounter of 09/20/11 (from the past 48 hour(s))  CBC     Status: Abnormal   Collection Time   09/22/11  6:25 AM      Component Value Range Comment   WBC 4.3  4.0 - 10.5 (K/uL)    RBC 3.22 (*) 3.87 - 5.11 (MIL/uL)    Hemoglobin 10.2 (*) 12.0 - 15.0 (g/dL)    HCT 52.8 (*) 41.3 - 46.0 (%)    MCV 88.5  78.0 - 100.0 (fL)    MCH 31.7  26.0 - 34.0 (pg)    MCHC 35.8  30.0 - 36.0 (g/dL)    RDW 24.4  01.0 - 27.2 (%)    Platelets 124 (*) 150 - 400 (K/uL)   BASIC METABOLIC PANEL     Status: Abnormal   Collection Time  09/22/11  6:25 AM      Component Value Range Comment   Sodium 129 (*) 135 - 145 (mEq/L)    Potassium 3.7  3.5 - 5.1 (mEq/L)    Chloride 101  96 - 112 (mEq/L)    CO2 21  19 - 32 (mEq/L)    Glucose, Bld 83  70 - 99 (mg/dL)    BUN 9  6 - 23 (mg/dL)    Creatinine, Ser 8.46  0.50 - 1.10 (mg/dL)    Calcium 7.8 (*) 8.4 - 10.5 (mg/dL)    GFR calc non Af Amer 85 (*) >90 (mL/min)    GFR calc Af Amer >90  >90 (mL/min)   T4, FREE     Status: Normal   Collection Time   09/22/11  6:25 AM      Component Value Range Comment   Free T4 1.43  0.80 - 1.80 (ng/dL)   CBC     Status: Abnormal   Collection Time   09/23/11  5:47 AM      Component Value Range Comment   WBC 5.4  4.0 - 10.5 (K/uL)    RBC 3.36 (*) 3.87 - 5.11 (MIL/uL)    Hemoglobin 10.5 (*) 12.0 - 15.0 (g/dL)    HCT 96.2 (*) 95.2 - 46.0 (%)    MCV 89.3  78.0 - 100.0 (fL)    MCH 31.3  26.0 - 34.0 (pg)    MCHC 35.0  30.0 - 36.0 (g/dL)    RDW 84.1  32.4 - 40.1 (%)    Platelets 125 (*) 150 - 400 (K/uL)   BASIC  METABOLIC PANEL     Status: Abnormal   Collection Time   09/23/11  5:47 AM      Component Value Range Comment   Sodium 130 (*) 135 - 145 (mEq/L)    Potassium 3.6  3.5 - 5.1 (mEq/L)    Chloride 99  96 - 112 (mEq/L)    CO2 23  19 - 32 (mEq/L)    Glucose, Bld 82  70 - 99 (mg/dL)    BUN 11  6 - 23 (mg/dL)    Creatinine, Ser 0.27  0.50 - 1.10 (mg/dL)    Calcium 8.2 (*) 8.4 - 10.5 (mg/dL)    GFR calc non Af Amer 82 (*) >90 (mL/min)    GFR calc Af Amer >90  >90 (mL/min)     Diagnostics:  Dg Chest 1 View  09/20/2011  *RADIOLOGY REPORT*  Clinical Data: Trauma.  Fall.  CHEST - 1 VIEW  Comparison: None.  Findings: Hyperexpansion is consistent with emphysema.  No evidence for pneumothorax. The cardiopericardial silhouette is enlarged. Bones are demineralized.  Old right clavicle fracture noted.  IMPRESSION: Emphysema with cardiomegaly.  Original Report Authenticated By: ERIC A. MANSELL, M.D.   Dg Hip Complete Right  09/20/2011  *RADIOLOGY REPORT*  Clinical Data: Fall.  Pain.  RIGHT HIP - COMPLETE 2+ VIEW  Comparison: None.  Findings: Frontal pelvis shows marked bony demineralization. Diffuse gaseous bowel distention obscures fine bony detail over the sacrum and medial pelvis.  No acute fracture can be identified. The patient has a left hip hemiarthroplasty although the entire prosthesis is not been visualized.  IMPRESSION: Limited study without acute bony findings.  Original Report Authenticated By: ERIC A. MANSELL, M.D.   Ct Head Wo Contrast  09/20/2011  *RADIOLOGY REPORT*  Clinical Data: Larey Seat prior to arrival.  Dizziness, weakness, wearing C-collar.  CT HEAD WITHOUT CONTRAST  Technique:  Contiguous axial images were  obtained from the base of the skull through the vertex without contrast.  Comparison: None.  Findings: Mild diffuse cerebral atrophy.  No mass effect or midline shift.  No abnormal extra-axial fluid collections.  Old lacunar infarct in the periventricular white matter on the left.   Scattered low attenuation changes in the deep white matter consistent with small vessel ischemia.  Gray-white matter junctions are distinct. Basal cisterns are not effaced.  No evidence of acute intracranial hemorrhage.  Intracranial arterial vascular calcifications. Visualized paranasal sinuses are not opacified.  No depressed skull fractures.  IMPRESSION: Chronic atrophy and small vessel ischemic changes.  No evidence of acute intracranial hemorrhage, mass lesion, or acute infarct.  Original Report Authenticated By: Marlon Pel, M.D.   Ct Cervical Spine Wo Contrast  09/20/2011  *RADIOLOGY REPORT*  Clinical Data: Larey Seat prior to arrival.  Dizziness, weakness, wearing C-collar.  CT CERVICAL SPINE WITHOUT CONTRAST  Technique:  Multidetector CT imaging of the cervical spine was performed. Multiplanar CT image reconstructions were also generated.  Comparison: None.  Findings: Degenerative changes with narrowed C4-5, C5-6, and C6-7 interspaces with associated endplate hypertrophic changes.  No endplate destruction.  Normal alignment of the cervical vertebrae, posterior elements, and facet joints.  No vertebral compression deformities.  No prevertebral soft tissue swelling.  Lateral masses of C1 are symmetrical.  The odontoid process is intact. Degenerative changes in the facet joints.  No significant paraspinal infiltration.  Vascular calcifications in the carotid arteries.  No significant cervical lymphadenopathy.  Calcification in the thyroid gland.  IMPRESSION: Degenerative changes in the cervical spine.  Normal alignment suggested.  No displaced fractures identified.  Original Report Authenticated By: Marlon Pel, M.D.   Ct Hip Right Wo Contrast  09/20/2011  *RADIOLOGY REPORT*  Clinical Data: Larey Seat today with right hip pain, worsening since earlier.  CT OF THE RIGHT HIP WITHOUT CONTRAST  Technique:  Multidetector CT imaging was performed according to the standard protocol. Multiplanar CT image  reconstructions were also generated.  Comparison: Plain films 09/20/2011  Findings: There is a comminuted displaced fracture of the right anterior iliac wing without involvement of the sacroiliac joint or acetabulum.  There is associated hematoma in the soft tissues.  The right hip, and the superior and inferior right pubic rami appear intact.  IMPRESSION: Comminuted displaced fracture of the right anterior iliac wing.  Original Report Authenticated By: Marlon Pel, M.D.   EKG: NSR with first degree AVB and nonspecific changes.  Echo:  Pending  Full Code   Hospital Course:  Please see H&P for admission details. The patient is a pleasant 77 year old quite active white female who fell and sustained an injury to her hip. She presented to the emergency room and was found to have a comminuted displaced fracture of the right anterior iliac wing. She also was hyponatremic. She was omitted to the hospitalist service and Dr. Hilda Lias was consulted. He recommended nonoperative management and weightbearing as tolerated. Patient progressed well and physical therapy feels that she would be appropriate for skilled nursing facility versus home with 24-hour supervision and home physical therapy. The patient elects to go home.  Workup of her hyponatremia was consistent with SIADH. She will go home on a fluid restricted regimen. Her sodium has improved. Her TSH is normal.  She had some constipation while here and can take laxatives and stool softeners at home. Total time on the day of discharge is greater than 30 minutes.  Discharge Exam: Blood pressure 152/72, pulse 93, temperature 98.4  F (36.9 C), temperature source Oral, resp. rate 18, height 5' (1.524 m), weight 49 kg (108 lb 0.4 oz), SpO2 97.00%.  Exam unchanged from 08/23/11  Signed: Crista Curb L 09/23/2011, 1:44 PM

## 2011-09-23 NOTE — Progress Notes (Signed)
Physical Therapy Treatment Patient Details Name: Felicia Harvey MRN: [id number] DOB: 06-Aug-1929 Today's Date: 09/23/2011  TIME:910-940/ 1 TE 1GT  PT Assessment/Plan  PT - Assessment/Plan Comments on Treatment Session: Pt able to increase gait distance today 10' total with seated rest break at 5' due to fatigue;vocalizing pain with any movement of LLE, however ablet to complete all exercises well PT Goals  Acute Rehab PT Goals PT Goal: Supine/Side to Sit - Progress: Met PT Transfer Goal: Sit to Stand/Stand to Sit - Progress: Met PT Goal: Ambulate - Progress: Progressing toward goal  PT Treatment Precautions/Restrictions  Precautions Required Braces or Orthoses: No Restrictions Weight Bearing Restrictions: Yes Mobility (including Balance) Bed Mobility Supine to Sit: 4: Min assist Supine to Sit Details (indicate cue type and reason): Assistance needed with trunk and verbal cues for use of rails Sitting - Scoot to Edge of Bed: 6: Modified independent (Device/Increase time) Transfers Transfers: Yes Sit to Stand: 4: Min assist Sit to Stand Details (indicate cue type and reason): verbal cues for hand placement and help with balance Stand to Sit: 4: Min assist Stand to Sit Details: VCs for hand placement and controllng descent Ambulation/Gait Ambulation/Gait: Yes Ambulation/Gait Assistance: 4: Min assist Ambulation/Gait Assistance Details (indicate cue type and reason): RW;VCs for sequencing of gait and RW and use of UE to redistribute weight from LLE Ambulation Distance (Feet):  (5' seated rest break 5') Assistive device: Rolling walker Gait Pattern: Step-to pattern;Shuffle;Antalgic;Decreased stride length Gait velocity: very slow and vocalized pain with each step Stairs: No Wheelchair Mobility Wheelchair Mobility: No    Exercise  Total Joint Exercises Quad Sets: Both;10 reps Gluteal Sets: 10 reps;Both Short Arc Quad: Both;10 reps Heel Slides: Both;10 reps (AAROM LLE) Hip  ABduction/ADduction: 10 reps;Both (AAROM LLE) Long Arc Quad: 10 reps;Both General Exercises - Lower Extremity Quad Sets: Both;10 reps Gluteal Sets: 10 reps;Both Short Arc Quad: Both;10 reps [employer]: 10 reps;Both Heel Slides: Both;10 reps (AAROM LLE) Hip ABduction/ADduction: 10 reps;Both (AAROM LLE) Toe Raises: Both;20 reps Heel Raises: Both (20 reps) Low Level/ICU Exercises Quad Sets: Both;10 reps Short Arc Quad: Both;10 reps Hip ABduction/ADduction: 10 reps;Both (AAROM LLE) Heel Slides: Both;10 reps (AAROM LLE) Amputee Exercises Quad Sets: Both;10 reps Gluteal Sets: 10 reps;Both Hip ABduction/ADduction: 10 reps;Both (AAROM LLE) Antenatal Exercises Quad Sets: Both;10 reps Gluteal Sets: 10 reps;Both Short Arc Quad: Both;10 reps Hip ABduction/ADduction: 10 reps;Both (AAROM LLE) Other Exercises Other Exercises: sit <> stand x5 End of Session PT - End of Session Equipment Utilized During Treatment: Gait belt (RW) Activity Tolerance: Patient limited by pain;Patient limited by fatigue Patient left: in chair;with call bell in reach General Behavior During Session: [facility] for tasks performed Cognition: [facility] for tasks performed  Astin Rape ATKINSO 09/23/2011, 10:42 AM

## 2011-09-23 NOTE — Progress Notes (Signed)
UR chart review completed.  

## 2011-09-24 ENCOUNTER — Telehealth: Payer: Self-pay | Admitting: Orthopedic Surgery

## 2011-09-24 NOTE — Telephone Encounter (Signed)
Approved check mobility issues because will need x ray in 8 week s

## 2011-09-24 NOTE — Telephone Encounter (Signed)
Patient's daughter, Felicia Harvey, called to request follow up with Dr. Romeo Apple.  Had been seen by Dr. Hilda Lias, who had been on call at the time of injury 09/20/11.  She had fallen again, had been admitted to [facility].  Diagnosis is pelvic fracture.  States her mom is now at home, and is scheduled to see Dr. Hilda Lias in 8 weeks.  She and mom prefer to see Dr. Romeo Apple if possible, since he had taken care of her for previous orthopedic problem.  Please advise.  Daughter Felicia Harvey work ph# [phone] Felicia Harvey) until 2pm and cell# [phone].

## 2011-09-24 NOTE — Telephone Encounter (Signed)
Called daughter Tresa Endo, cell ph# unable to receive messages.  Called patient's home ph# and left message with female family member, who also gave me ph# [phone].  Left voice mail message.

## 2011-09-24 NOTE — Telephone Encounter (Signed)
Received call back from Perry, patient's daughter.  Appointment has been scheduled per Dr. Mort Sawyers reply.  Tresa Endo mentioned that patient will be receiving home health care and therapy from [facility] and she will contact both their office and Dr.Keeling's office to notify that patient will be following up with Dr. Romeo Apple, in event that any further correspondence is needed.

## 2011-09-25 ENCOUNTER — Emergency Department (HOSPITAL_COMMUNITY): Payer: Medicare Other

## 2011-09-25 ENCOUNTER — Encounter (HOSPITAL_COMMUNITY): Payer: Self-pay

## 2011-09-25 ENCOUNTER — Other Ambulatory Visit: Payer: Self-pay

## 2011-09-25 ENCOUNTER — Inpatient Hospital Stay (HOSPITAL_COMMUNITY)
Admission: EM | Admit: 2011-09-25 | Discharge: 2011-10-01 | DRG: 644 | Disposition: A | Discharge status: Home Health | Payer: Medicare Other | Attending: Family Medicine | Admitting: Family Medicine

## 2011-09-25 ENCOUNTER — Telehealth: Payer: Self-pay | Admitting: Orthopedic Surgery

## 2011-09-25 DIAGNOSIS — E860 Dehydration: Secondary | ICD-10-CM | POA: Diagnosis present

## 2011-09-25 DIAGNOSIS — E222 Syndrome of inappropriate secretion of antidiuretic hormone: Secondary | ICD-10-CM | POA: Diagnosis present

## 2011-09-25 DIAGNOSIS — K219 Gastro-esophageal reflux disease without esophagitis: Secondary | ICD-10-CM

## 2011-09-25 DIAGNOSIS — F54 Psychological and behavioral factors associated with disorders or diseases classified elsewhere: Secondary | ICD-10-CM | POA: Diagnosis present

## 2011-09-25 DIAGNOSIS — R5381 Other malaise: Secondary | ICD-10-CM | POA: Diagnosis present

## 2011-09-25 DIAGNOSIS — R1310 Dysphagia, unspecified: Secondary | ICD-10-CM | POA: Diagnosis present

## 2011-09-25 DIAGNOSIS — F329 Major depressive disorder, single episode, unspecified: Secondary | ICD-10-CM

## 2011-09-25 DIAGNOSIS — R51 Headache: Secondary | ICD-10-CM

## 2011-09-25 DIAGNOSIS — F458 Other somatoform disorders: Secondary | ICD-10-CM | POA: Diagnosis present

## 2011-09-25 DIAGNOSIS — E871 Hypo-osmolality and hyponatremia: Secondary | ICD-10-CM | POA: Diagnosis present

## 2011-09-25 DIAGNOSIS — R5383 Other fatigue: Secondary | ICD-10-CM | POA: Diagnosis present

## 2011-09-25 DIAGNOSIS — R631 Polydipsia: Secondary | ICD-10-CM | POA: Diagnosis present

## 2011-09-25 DIAGNOSIS — T148XXA Other injury of unspecified body region, initial encounter: Secondary | ICD-10-CM

## 2011-09-25 DIAGNOSIS — R531 Weakness: Secondary | ICD-10-CM | POA: Diagnosis present

## 2011-09-25 DIAGNOSIS — S329XXA Fracture of unspecified parts of lumbosacral spine and pelvis, initial encounter for closed fracture: Secondary | ICD-10-CM | POA: Diagnosis present

## 2011-09-25 DIAGNOSIS — W19XXXA Unspecified fall, initial encounter: Secondary | ICD-10-CM

## 2011-09-25 DIAGNOSIS — E039 Hypothyroidism, unspecified: Secondary | ICD-10-CM | POA: Diagnosis present

## 2011-09-25 DIAGNOSIS — K208 Other esophagitis without bleeding: Secondary | ICD-10-CM | POA: Diagnosis present

## 2011-09-25 DIAGNOSIS — I10 Essential (primary) hypertension: Secondary | ICD-10-CM

## 2011-09-25 DIAGNOSIS — D649 Anemia, unspecified: Secondary | ICD-10-CM

## 2011-09-25 DIAGNOSIS — N3000 Acute cystitis without hematuria: Secondary | ICD-10-CM | POA: Diagnosis present

## 2011-09-25 DIAGNOSIS — R52 Pain, unspecified: Secondary | ICD-10-CM

## 2011-09-25 DIAGNOSIS — E236 Other disorders of pituitary gland: Principal | ICD-10-CM | POA: Diagnosis present

## 2011-09-25 LAB — CBC
MCH: 31.9 pg (ref 26.0–34.0)
Platelets: 192 10*3/uL (ref 150–400)
RBC: 3.29 MIL/uL — ABNORMAL LOW (ref 3.87–5.11)
RDW: 13.2 % (ref 11.5–15.5)
WBC: 6.8 10*3/uL (ref 4.0–10.5)

## 2011-09-25 LAB — COMPREHENSIVE METABOLIC PANEL
ALT: 14 U/L (ref 0–35)
AST: 29 U/L (ref 0–37)
Albumin: 3.4 g/dL — ABNORMAL LOW (ref 3.5–5.2)
Alkaline Phosphatase: 56 U/L (ref 39–117)
Calcium: 8.7 mg/dL (ref 8.4–10.5)
GFR calc Af Amer: 54 mL/min — ABNORMAL LOW (ref >90)
Glucose, Bld: 118 mg/dL — ABNORMAL HIGH (ref 70–99)
Potassium: 4.1 mEq/L (ref 3.5–5.1)
Sodium: 121 mEq/L — ABNORMAL LOW (ref 135–145)
Total Protein: 5.7 g/dL — ABNORMAL LOW (ref 6.0–8.3)

## 2011-09-25 LAB — CK TOTAL AND CKMB (NOT AT ARMC): Relative Index: 1.7 (ref 0.0–2.5)

## 2011-09-25 LAB — DIFFERENTIAL
Basophils Absolute: 0 10*3/uL (ref 0.0–0.1)
Eosinophils Absolute: 0.1 10*3/uL (ref 0.0–0.7)
Lymphs Abs: 1.1 10*3/uL (ref 0.7–4.0)
Neutrophils Relative %: 73 % (ref 43–77)

## 2011-09-25 LAB — URINALYSIS, ROUTINE W REFLEX MICROSCOPIC
Nitrite: POSITIVE — AB
Specific Gravity, Urine: <1.005 — ABNORMAL LOW (ref 1.005–1.030)
Urobilinogen, UA: 0.2 mg/dL (ref 0.0–1.0)
pH: 5.5 (ref 5.0–8.0)

## 2011-09-25 LAB — PROTIME-INR
INR: 1.01 (ref 0.00–1.49)
Prothrombin Time: 13.5 seconds (ref 11.6–15.2)

## 2011-09-25 LAB — MAGNESIUM: Magnesium: 1.9 mg/dL (ref 1.5–2.5)

## 2011-09-25 LAB — TROPONIN I: Troponin I: <0.3 ng/mL (ref <0.30)

## 2011-09-25 MED ORDER — ONDANSETRON HCL 4 MG/2ML IJ SOLN: 4.0000 mg | Freq: Four times a day (QID) | INTRAMUSCULAR | Status: DC | PRN

## 2011-09-25 MED ORDER — SODIUM CHLORIDE 0.9 % IV SOLN
Freq: Once | INTRAVENOUS | Status: AC
  Administered 2011-09-25: 16:00:00 via INTRAVENOUS

## 2011-09-25 MED ORDER — POLYETHYLENE GLYCOL 3350 17 G PO PACK
17.0000 g | PACK | Freq: Every day | ORAL | Status: DC
  Administered 2011-09-26 – 2011-10-01 (×6): 17 g via ORAL
  Filled 2011-09-25 (×6): qty 1

## 2011-09-25 MED ORDER — HEPARIN SODIUM (PORCINE) 5000 UNIT/ML IJ SOLN
5000.0000 [IU] | Freq: Three times a day (TID) | INTRAMUSCULAR | Status: DC
  Administered 2011-09-25 – 2011-09-26 (×4): 5000 [IU] via SUBCUTANEOUS
  Filled 2011-09-25 (×4): qty 1

## 2011-09-25 MED ORDER — ONDANSETRON HCL 4 MG PO TABS: 4.0000 mg | ORAL_TABLET | Freq: Four times a day (QID) | ORAL | Status: DC | PRN

## 2011-09-25 MED ORDER — HYDROMORPHONE HCL 1 MG/ML IJ SOLN: 0.5000 mg | INTRAMUSCULAR | Status: DC | PRN

## 2011-09-25 MED ORDER — LEVOTHYROXINE SODIUM 112 MCG PO TABS
112.0000 ug | ORAL_TABLET | Freq: Every day | ORAL | Status: DC
  Administered 2011-09-26 – 2011-10-01 (×5): 112 ug via ORAL
  Filled 2011-09-25 (×7): qty 1

## 2011-09-25 MED ORDER — QUETIAPINE FUMARATE 100 MG PO TABS
100.0000 mg | ORAL_TABLET | Freq: Every day | ORAL | Status: DC
  Administered 2011-09-25 – 2011-09-30 (×6): 100 mg via ORAL
  Filled 2011-09-25 (×6): qty 1

## 2011-09-25 MED ORDER — HYDROCODONE-ACETAMINOPHEN 5-325 MG PO TABS
0.5000 | ORAL_TABLET | ORAL | Status: DC | PRN
Start: 2011-09-25 — End: 2011-10-01
  Administered 2011-09-26 – 2011-09-29 (×3): 1 via ORAL
  Filled 2011-09-25 (×3): qty 1

## 2011-09-25 MED ORDER — ACETAMINOPHEN 325 MG PO TABS: 650.0000 mg | ORAL_TABLET | Freq: Four times a day (QID) | ORAL | Status: DC | PRN

## 2011-09-25 MED ORDER — SODIUM CHLORIDE 0.9 % IV SOLN
INTRAVENOUS | Status: DC
  Administered 2011-09-25 – 2011-09-27 (×3): via INTRAVENOUS
  Administered 2011-09-28: 1000 mL via INTRAVENOUS

## 2011-09-25 MED ORDER — ASPIRIN 81 MG PO CHEW
81.0000 mg | CHEWABLE_TABLET | Freq: Every day | ORAL | Status: DC
  Administered 2011-09-25 – 2011-10-01 (×6): 81 mg via ORAL
  Filled 2011-09-25 (×6): qty 1

## 2011-09-25 MED ORDER — PANTOPRAZOLE SODIUM 40 MG PO TBEC
40.0000 mg | DELAYED_RELEASE_TABLET | Freq: Every day | ORAL | Status: DC
  Administered 2011-09-25 – 2011-09-26 (×2): 40 mg via ORAL
  Filled 2011-09-25 (×2): qty 1

## 2011-09-25 MED ORDER — LISINOPRIL 10 MG PO TABS
40.0000 mg | ORAL_TABLET | Freq: Every day | ORAL | Status: DC
  Administered 2011-09-26 – 2011-10-01 (×6): 40 mg via ORAL
  Filled 2011-09-25 (×6): qty 4

## 2011-09-25 MED ORDER — HYDROMORPHONE HCL 1 MG/ML IJ SOLN
0.5000 mg | Freq: Once | INTRAMUSCULAR | Status: AC
  Administered 2011-09-25: 0.5 mg via INTRAVENOUS
  Filled 2011-09-25: qty 1

## 2011-09-25 NOTE — ED Notes (Addendum)
Pt's daughter came out to desk, very irrate, stating pt had urinated all over the bed and pt was suppose to have a cathter in; Dr. Jeraldine Loots went in to see pt and spoke with pt's family; Dr. Jeraldine Loots came out to nurses' desk and said to insert a foley catheter into pt. Pt had an inconitinence of urine, pt cleaned and bed linen changed; foley catheter inserted; clear yellow urine return, pt in nad at this time

## 2011-09-25 NOTE — ED Provider Notes (Signed)
History     CSN: [id number] Arrival date & time: 09/25/2011  1:49 PM   First MD Initiated Contact with Patient 09/25/11 1411      Chief Complaint  Patient presents with  . Weakness    (Consider location/radiation/quality/duration/timing/severity/associated sxs/prior treatment) HPI The patient presents today for after discharge, with new right-sided weakness. She was admitted 5 days ago following a mechanical fall and was diagnosed with a right pelvis fracture. Following her uneventful admission, she notes that she had been doing well at home until this morning. He hours prior to presentation, she gradually noticed dysesthesia about the right side. She notes that from her right face through to her right foot she has numbness, pain, and weakness. The symptoms have been present since onset, and are not appreciably relieved by anything, nor notably worse with anything. Notably the patient yesterday had a home health visit, was found to have a blood pressure of 80/60. During that time she also developed mild chest pain, which has nearly ceased entirely, this was improved with Alka-Seltzer. The chest pain is not exertional, and is not pleuritic No fever, no chills, no cough, no disorientation, no difficulty swallowing or speaking, no recall issues. Past Medical History  Diagnosis Date  . Thyroid disease   . HTN (hypertension)   . Reflux   . Anxiety   . Osteopenia   . GERD (gastroesophageal reflux disease)   . Hypothyroidism   . Emphysema 09/21/2011    Per CXR  . Dysphagia 09/21/2011  . Chest pain 09/21/2011  . Schizophrenia   . Polydipsia 09/21/2011  . 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 Left shoulder   . Right hip replacement   . Thyroidectomy   . Vertebroplasty   . Hip surgery Lt    Family History  Problem Relation Age of Onset  . Cancer    . Kidney disease      History  Substance Use Topics  . Smoking status: Never Smoker     . Smokeless tobacco: Not on file  . Alcohol Use: No    OB History    Grav Para Term Preterm Abortions TAB SAB Ect Mult Living                  Review of Systems Gen: Per HPI HEENT: No HA CV: hpi Resp: hpi Abd: Per HPI, otherwise negative Musk: Per HPI, otherwise negative Neuro: hpi GU: Per HPI, otherwise negative Skin: Neg Psych: Neg  Allergies  Gluten  Home Medications   Current Outpatient Rx  Name Route Sig Dispense Refill  . ACETAMINOPHEN 325 MG PO TABS Oral Take 650 mg by mouth every 6 (six) hours as needed.      Marland Kitchen FOSAMAX PO Oral Take 1 tablet by mouth once a week. On Fridays of each week     . ASPIRIN 81 MG PO TABS Oral Take 81 mg by mouth daily.      . TUMS PO Oral Take by mouth.     Marland Kitchen DIAZEPAM PO Oral Take by mouth at bedtime as needed.     Marland Kitchen HYDROCODONE-ACETAMINOPHEN 5-325 MG PO TABS Oral Take 0.5-2 tablets by mouth every 4 (four) hours as needed for pain. 30 tablet 0  . LEVOTHYROXINE SODIUM 112 MCG PO TABS Oral Take 112 mcg by mouth daily.      Marland Kitchen LISINOPRIL 40 MG PO TABS Oral Take 40 mg by mouth daily.      Marygrace Drought VITACRAVES  PO Oral Take by mouth.      . OMEPRAZOLE 20 MG PO CPDR Oral Take 20 mg by mouth daily.      . QUETIAPINE FUMARATE 100 MG PO TABS Oral Take 100 mg by mouth at bedtime.        BP 103/54  Pulse 75  Temp(Src) 97.5 F (36.4 C) (Oral)  Resp 18  Ht 5\' 3"  (1.6 m)  Wt 100 lb (45.36 kg)  BMI 17.71 kg/m2  SpO2 100%  Physical Exam  Constitutional: She is oriented to person, place, and time. She appears well-developed and well-nourished. No distress.  HENT:  Head: Normocephalic and atraumatic.  Mouth/Throat: Oropharynx is clear and moist.  Eyes: Conjunctivae and EOM are normal. Pupils are equal, round, and reactive to light. Right eye exhibits no discharge. Left eye exhibits no discharge. No scleral icterus.  Neck: Normal range of motion. Neck supple. No thyromegaly present.  Cardiovascular: Normal rate and intact distal pulses.   Exam reveals no gallop and no friction rub.   Murmur heard. Pulmonary/Chest: Effort normal and breath sounds normal.  Abdominal: Soft. She exhibits no distension. There is no tenderness.  Musculoskeletal: She exhibits no edema and no tenderness.  Lymphadenopathy:    She has no cervical adenopathy.  Neurological: She is alert and oriented to person, place, and time. She has normal strength. No cranial nerve deficit or sensory deficit. Coordination abnormal. GCS eye subscore is 4. GCS verbal subscore is 5. GCS motor subscore is 6.       Patient has mild difficulty with right finger nose testing, no abnormal alternating hand, nor other focal notable findings on neurologic exam. The patient was not walked, in light of her recent pelvic fracture    ED Course  Procedures (including critical care time)   Labs Reviewed  PROTIME-INR  APTT  CBC  DIFFERENTIAL  COMPREHENSIVE METABOLIC PANEL  CK TOTAL AND CKMB  TROPONIN I  URINALYSIS, ROUTINE W REFLEX MICROSCOPIC   No results found.   No diagnosis found.   Date: 09/25/2011  Rate: 70  Rhythm: normal sinus rhythm and premature ventricular contractions (PVC)  QRS Axis: normal  Intervals: normal  ST/T Wave abnormalities: normal  Conduction Disutrbances:first-degree A-V block   Narrative Interpretation:   Old EKG Reviewed: unchanged    MDM  This 70 erythema now presents with several hours of poorly described weakness, and subjective right-sided deficits. The patient is a notable recent history of a mechanical fall suffered 5 days ago that resulted in a right pelvic fracture.  On exam the patient has negligible strength deficits, and mild disdidakokinesia is the only appreciable focal deficit.  Her radiographic and laboratory evaluation is notable for a hyponatremic state.  The patient has been started on normal saline, and will be admitted for further evaluation and management.  Gerhard Munch, MD 09/25/11 [phone]

## 2011-09-25 NOTE — Telephone Encounter (Signed)
Patient's daughter, Felicia Harvey called to let Dr. Romeo Apple know that her mother, Felicia Harvey was admitted to [facility] today, complaining of weakness, low BP,and pain on her right side. Kelly's # home [phone]   Work [phone]

## 2011-09-25 NOTE — ED Notes (Signed)
Pt reports fell Friday and fractured pelvis.  Says was admitted here and was d/c'd Monday.  Pt had a therapist come in yesterday and noticed her b/p was 80/60.  This am the home health aid came and helped give her a bath.  Pt says felt good this am.  APprox ago started c/o of face feeling weird.  Says r side of face is numb and r side of body feels weak.  Pt also has been having chest pain since yesterday.  Reports has been using alkaselzter with some relief.

## 2011-09-25 NOTE — ED Notes (Signed)
EDP in with pt at this time.  

## 2011-09-25 NOTE — ED Notes (Signed)
Pt states that she is unable to provide urine sample at this time.  

## 2011-09-26 DIAGNOSIS — R1310 Dysphagia, unspecified: Secondary | ICD-10-CM

## 2011-09-26 LAB — COMPREHENSIVE METABOLIC PANEL
ALT: 14 U/L (ref 0–35)
AST: 27 U/L (ref 0–37)
Albumin: 3 g/dL — ABNORMAL LOW (ref 3.5–5.2)
CO2: 22 mEq/L (ref 19–32)
Calcium: 8.2 mg/dL — ABNORMAL LOW (ref 8.4–10.5)
Chloride: 101 mEq/L (ref 96–112)
GFR calc non Af Amer: 80 mL/min — ABNORMAL LOW (ref >90)
Sodium: 133 mEq/L — ABNORMAL LOW (ref 135–145)
Total Bilirubin: 0.9 mg/dL (ref 0.3–1.2)

## 2011-09-26 LAB — CBC
Hemoglobin: 10 g/dL — ABNORMAL LOW (ref 12.0–15.0)
MCH: 31 pg (ref 26.0–34.0)
MCHC: 35.1 g/dL (ref 30.0–36.0)
MCV: 88.2 fL (ref 78.0–100.0)

## 2011-09-26 MED ORDER — DEXTROSE 5 % IV SOLN
1.0000 g | INTRAVENOUS | Status: DC
  Administered 2011-09-26 – 2011-09-28 (×3): 1 g via INTRAVENOUS
  Filled 2011-09-26 (×5): qty 1

## 2011-09-26 MED ORDER — DILTIAZEM HCL ER COATED BEADS 120 MG PO CP24
120.0000 mg | ORAL_CAPSULE | Freq: Every day | ORAL | Status: DC
  Administered 2011-09-26 – 2011-10-01 (×6): 120 mg via ORAL
  Filled 2011-09-26 (×6): qty 1

## 2011-09-26 MED ORDER — SODIUM CHLORIDE 0.45 % IV SOLN: Freq: Once | INTRAVENOUS | Status: DC

## 2011-09-26 NOTE — Consults (Addendum)
Referring Provider: No ref. provider found Primary Care Physician:  Dwana Melena, MD Primary Gastroenterologist:  Dr. Jena Gauss  Reason for Consultation:  Esophageal dysphagia   HPI:   Pleasant 77 year old lady admitted to the hospital with weakness secondary to hyponatremia and recent pelvic fracture. She relates a long history of esophageal dysphagia to solid food and poorly controlled GERD symptoms despite taking omeprazole 20 mg orally 3 times daily. She said GERD for years. She's had esophageal dysphagia for years. She recalls many years ago when she lived in [place], she underwent an esophageal dilation. She has not seen a GI doctor since being in [place] as she reports. Oral Fosamax once weekly. She denies melena, hematochezia, constipation,  Diarrhea,  early satiety,  nausea or vomiting.  She denies any prior GI surgery. She tells me she had a colonoscopy but it's been many, many years ago and she does not recall any details. There is no family history of gastrointestinal neoplasia.  Past Medical History  Diagnosis Date  . Thyroid disease   . HTN (hypertension)   . Reflux   . Anxiety   . Osteopenia   . GERD (gastroesophageal reflux disease)   . Hypothyroidism   . Emphysema 09/21/2011    Per CXR  . Dysphagia 09/21/2011  . Chest pain 09/21/2011  . Schizophrenia   . Polydipsia 09/21/2011  . 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 Left shoulder   . Right hip replacement   . Thyroidectomy   . Vertebroplasty   . Hip surgery Lt    Prior to Admission medications   Medication Sig Start Date End Date Taking? Authorizing Provider  alendronate (FOSAMAX) 70 MG tablet Take 70 mg by mouth every 7 (seven) days. On Fridays of each weekTake with a full glass of water on an empty stomach.    Yes Historical Provider, MD  aspirin 81 MG tablet Take 81 mg by mouth daily.     Yes Historical Provider, MD  diltiazem (CARDIZEM CD) 120 MG 24 hr capsule  Take 120 mg by mouth at bedtime.     Yes Historical Provider, MD  HYDROcodone-acetaminophen (NORCO) 5-325 MG per tablet Take 0.5-2 tablets by mouth every 4 (four) hours as needed for pain. 09/23/11  Yes Corinna L Sullivan  levothyroxine (SYNTHROID, LEVOTHROID) 112 MCG tablet Take 112 mcg by mouth daily.     Yes Historical Provider, MD  lisinopril (PRINIVIL,ZESTRIL) 40 MG tablet Take 40 mg by mouth daily.     Yes Historical Provider, MD  QUEtiapine (SEROQUEL) 100 MG tablet Take 100 mg by mouth at bedtime.     Yes Historical Provider, MD  acetaminophen (TYLENOL) 325 MG tablet Take 650 mg by mouth every 6 (six) hours as needed. For pain    Historical Provider, MD  Calcium Carbonate Antacid (TUMS PO) Take 1 tablet by mouth daily as needed. For indigestion    Historical Provider, MD  Multiple Vitamins-Minerals (ONE-A-DAY VITACRAVES PO) Take 1 tablet by mouth daily.     Historical Provider, MD  omeprazole (PRILOSEC) 20 MG capsule Take 20 mg by mouth 3 (three) times daily with meals.     Historical Provider, MD    Current Facility-Administered Medications  Medication Dose Route Frequency Provider Last Rate Last Dose  . 0.9 %  sodium chloride infusion   Intravenous Once Gerhard Munch, MD 150 mL/hr at 09/25/11 1606    . 0.9 %  sodium chloride infusion   Intravenous Continuous Novlet Jarrett-Davis 75  mL/hr at 09/26/11 1201    . acetaminophen (TYLENOL) tablet 650 mg  650 mg Oral Q6H PRN Novlet Jarrett-Davis      . aspirin chewable tablet 81 mg  81 mg Oral Daily Novlet Jarrett-Davis   81 mg at 09/26/11 1030  . cefTRIAXone (ROCEPHIN) 1 g in dextrose 5 % 50 mL IVPB  1 g Intravenous Q24H Novlet Jarrett-Davis   1 g at 09/26/11 1201  . diltiazem (CARDIZEM CD) 24 hr capsule 120 mg  120 mg Oral Daily Novlet Jarrett-Davis   120 mg at 09/26/11 1201  . heparin injection 5,000 Units  5,000 Units Subcutaneous Q8H Novlet Jarrett-Davis   5,000 Units at 09/26/11 0700  . HYDROcodone-acetaminophen (NORCO) 5-325 MG per  tablet 0.5-2 tablet  0.5-2 tablet Oral Q4H PRN Novlet Jarrett-Davis      . HYDROmorphone (DILAUDID) injection 0.5 mg  0.5 mg Intravenous Q4H PRN Novlet Jarrett-Davis      . HYDROmorphone (DILAUDID) injection 0.5 mg  0.5 mg Intravenous Once Gerhard Munch, MD   0.5 mg at 09/25/11 1946  . levothyroxine (SYNTHROID, LEVOTHROID) tablet 112 mcg  112 mcg Oral QAC breakfast Novlet Jarrett-Davis   112 mcg at 09/26/11 1030  . lisinopril (PRINIVIL,ZESTRIL) tablet 40 mg  40 mg Oral Daily Novlet Jarrett-Davis   40 mg at 09/26/11 1030  . ondansetron (ZOFRAN) tablet 4 mg  4 mg Oral Q6H PRN Novlet Jarrett-Davis       Or  . ondansetron (ZOFRAN) injection 4 mg  4 mg Intravenous Q6H PRN Novlet Jarrett-Davis      . pantoprazole (PROTONIX) EC tablet 40 mg  40 mg Oral Q1200 Novlet Jarrett-Davis   40 mg at 09/26/11 1201  . polyethylene glycol (MIRALAX / GLYCOLAX) packet 17 g  17 g Oral Daily Novlet Jarrett-Davis   17 g at 09/26/11 1029  . QUEtiapine (SEROQUEL) tablet 100 mg  100 mg Oral QHS Novlet Jarrett-Davis   100 mg at 09/25/11 2240    Allergies as of 09/25/2011 - Review Complete 09/25/2011  Allergen Reaction Noted  . Gluten  09/20/2011    History   Social History  . Marital Status: Divorced    Spouse Name: N/A    Number of Children: N/A  . Years of Education: 12th   Occupational History  .     Social History Main Topics  . Smoking status: Never Smoker   . Smokeless tobacco: Not on file  . Alcohol Use: No  . Drug Use: No  . Sexually Active: Not on file   Other Topics Concern  . Not on file   Social History Narrative  . No narrative on file    Review of Systems: Gen: Denies any fever, chills, sweats, anorexia, fatigue, weakness, malaise, weight loss, and sleep disorder CV: Denies chest pain, angina, palpitations, syncope, orthopnea, PND, peripheral edema, and claudication. Resp: Denies dyspnea at rest, dyspnea with exercise, cough, sputum, wheezing, coughing up blood, and pleurisy. GI:  Denies vomiting blood, jaundice, and fecal incontinence.   Denies dysphagia or odynophagia. Derm: Denies rash, itching, dry skin, hives, moles, warts, or unhealing ulcers.  Psych: Denies depression, anxiety, memory loss, suicidal ideation, hallucinations, paranoia, and confusion. Heme: Denies bruising, bleeding, and enlarged lymph nodes.   Physical Exam: Vital signs in last 24 hours: Temp:  [97.9 F (36.6 C)-98.6 F (37 C)] 98.6 F (37 C) (10/18 0525) Pulse Rate:  [64-104] 104  (10/18 0525) Resp:  [16-20] 20  (10/18 0525) BP: (115-154)/(42-85) 154/85 mmHg (10/18 0525) SpO2:  [94 %-100 %]  94 % (10/18 0525) Weight:  [106 lb 3.2 oz (48.172 kg)-108 lb 14.4 oz (49.397 kg)] 108 lb 14.4 oz (49.397 kg) (10/18 0525) Last BM Date:  (unsure) General:   Alert,  frail, soft-spoken lady with a prominent british accent. pleasant and cooperative in NAD Head:  Normocephalic and atraumatic. Eyes:  Sclera clear, no icterus.   Conjunctiva pink. Ears:  Normal auditory acuity. Nose:  No deformity, discharge,  or lesions. Mouth:  No deformity or lesions, dentition normal. Neck:  Supple; no masses or thyromegaly. Lungs:  Clear throughout to auscultation.   No wheezes, crackles, or rhonchi. No acute distress. Heart:  Regular rate and rhythm; no murmurs, clicks, rubs,  or gallops. Abdomen:  Positive bowel sounds , nontender and nondistended. No masses, hepatosplenomegaly or hernias noted.  Rectal:  Deferred until time of colonoscopy.   Msk:  Symmetrical without gross deformities. Normal posture. Pulses:  Normal pulses noted. Extremities:  Without clubbing or edema.  Intake/Output from previous day: 10/17 0701 - 10/18 0700 In: 610 [I.V.:610] Out: 875 [Urine:875] Intake/Output this shift: Total I/O In: -  Out: 1500 [Urine:1500]  Lab Results:  [facility] 09/26/11 0516 09/25/11 1443  WBC 4.4 6.8  HGB 10.0* 10.5*  HCT 28.5* 29.2*  PLT 198 192   BMET  Basename 09/26/11 0516 09/25/11 1443  NA 133*  121*  K 4.0 4.1  CL 101 87*  CO2 22 22  GLUCOSE 95 118*  BUN 9 16  CREATININE 0.72 1.10  CALCIUM 8.2* 8.7   LFT  Basename 09/26/11 0516  PROT 5.4*  ALBUMIN 3.0*  AST 27  ALT 14  ALKPHOS 52  BILITOT 0.9  BILIDIR --  IBILI --   PT/INR  Basename 09/25/11 1443  LABPROT 13.5  INR 1.01   Studies/Results: Dg Chest 1 View  09/25/2011  *RADIOLOGY REPORT*  Clinical Data: Right side weakness, hypertension  CHEST - 1 VIEW  Comparison: 09/20/2011  Findings: Due to recent pelvic fracture, unable to stand for upright PA and lateral exam. Enlargement of cardiac silhouette. Mediastinal contours and pulmonary vascularity normal. Emphysematous changes without infiltrate or effusion. Minimal biapical scarring. No pneumothorax. Diffuse osseous demineralization. Prior vertebroplasty at thoracolumbar junction. Old mid right clavicular fracture.  IMPRESSION: Enlargement of cardiac silhouette. Emphysematous changes. No acute abnormalities.  Original Report Authenticated By: Lollie Marrow, M.D.   Ct Head Wo Contrast  09/25/2011  *RADIOLOGY REPORT*  Clinical Data: Right side weakness, fell on 09/20/2011  CT HEAD WITHOUT CONTRAST  Technique:  Contiguous axial images were obtained from the base of the skull through the vertex without contrast.  Comparison: 09/20/2011  Findings: Generalized atrophy. Normal ventricular morphology. No midline shift or mass effect. Minimal small vessel chronic ischemic changes of deep cerebral white matter. No intracranial hemorrhage, mass lesion or evidence of acute infarction. No extra-axial fluid collection. Visualized paranasal sinuses and mastoid air cells clear. Bones demineralized.  IMPRESSION: Atrophy with minimal small vessel chronic ischemic changes of deep cerebral white matter. No acute intracranial abnormalities.  Original Report Authenticated By: Lollie Marrow, M.D.   Mr Brain Wo Contrast  09/26/2011  *RADIOLOGY REPORT*  Clinical Data: Fall 5 days ago.  Pelvic  fracture.  Onset of numbness right facial region and extending down right body with weakness.  MRI HEAD WITHOUT CONTRAST  Technique:  Multiplanar, multiecho pulse sequences of the brain and surrounding structures were obtained according to standard protocol without intravenous contrast.  Comparison: 09/25/2011 CT.  No comparison MR.  Findings: No acute infarct.  No  intracranial hemorrhage.  No intracranial mass lesion detected on this unenhanced exam.  Mild small vessel disease type changes.  Mild global atrophy without hydrocephalus.  Major intracranial vascular structures are patent.  Minimal ethmoid sinus air cell mucosal thickening.  Shallow sella with small pituitary gland incidentally noted.  IMPRESSION: No acute infarct.  Mild small vessel disease type changes.  Mild global atrophy.  Preliminary report at the time of imaging by Dr. Benard Rink.  Original Report Authenticated By: Fuller Canada, M.D.   Impression:     A delightful 77 year old lady from [place] with long-standing GERD symptoms, now refractory to 3 times a day omeprazole therapy with worsening of chronic underlying esophageal dysphagia.  She gives a vague history of undergoing esophageal dilation many years ago.  This lady most likely has a Schatzki's ring versus a peptic esophageal stricture to account for her symptoms. I suspect an esophageal motility disorder or an occult underlying neoplasm would be much less likely.  I note she is taking oral bisphosphonate therapy which may be problematic in this setting.     Recommendations:  I have Offered this lady an EGD with possible esophageal dilation tomorrow.  The risks  benefits, limitations, alternatives and imponderables have been reviewed with the patient.  Questions have been answered. All parties agreeable.   The Lovenox tomorrow.    Further recommendations to follow.  I would like to the hospitalist service for Allowing me to see this very nice lady today.  LOS: 1 day    Eula Listen  09/26/2011, 1:51 PM

## 2011-09-26 NOTE — Progress Notes (Signed)
Physical Therapy Evaluation Patient Details Name: Felicia Harvey MRN: [id number] DOB: Dec 27, 1928 Today's Date: 09/26/2011  Problem List:  Patient Active Problem List  Diagnoses  . Hypertension  . Hypothyroidism  . Depression  . Headache  . Anemia  . Fall  . Pelvic fracture  . Emphysema  . Dysphagia  . Chest pain  . SIADH (syndrome of inappropriate ADH production)  . Hematoma  . GERD (gastroesophageal reflux disease)    Past Medical History:  Past Medical History  Diagnosis Date  . Thyroid disease   . HTN (hypertension)   . Reflux   . Anxiety   . Osteopenia   . GERD (gastroesophageal reflux disease)   . Hypothyroidism   . Emphysema 09/21/2011    Per CXR  . Dysphagia 09/21/2011  . Chest pain 09/21/2011  . Schizophrenia   . Polydipsia 09/21/2011  . 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 Left shoulder   . Right hip replacement   . Thyroidectomy   . Vertebroplasty   . Hip surgery Lt    PT Assessment/Plan/Recommendation PT Assessment Clinical Impression Statement: pt recently discharged with pelvic fx to home...cont to have pain in R hip with decreased strength of LEs and decreased overall mobility...gait is severely compromised with inability to lift either foot off of floor making gait non-functional...recommend SNF at d/c--pt agreeable PT Recommendation/Assessment: Patient will need skilled PT in the acute care venue PT Problem List: Decreased strength;Decreased range of motion;Decreased activity tolerance;Decreased mobility;Decreased knowledge of use of DME;Decreased safety awareness;Pain Barriers to Discharge: None PT Therapy Diagnosis : Difficulty walking;Abnormality of gait;Acute pain PT Plan PT Frequency: Min 3X/week PT Treatment/Interventions: DME instruction;Gait training;Functional mobility training;Therapeutic exercise;Patient/family education PT Recommendation Recommendations for Other  Services: OT consult Follow Up Recommendations: Skilled nursing facility Equipment Recommended: Defer to next venue PT Goals  Acute Rehab PT Goals PT Goal Formulation: With patient Time For Goal Achievement: 2 weeks Pt will go Supine/Side to Sit: with supervision Pt will go Sit to Supine/Side: with min assist Pt will Transfer Sit to Stand/Stand to Sit: with supervision Pt will Transfer Bed to Chair/Chair to Bed: with supervision Pt will Ambulate: 16 - 50 feet;with min assist;with rolling walker  PT Evaluation Precautions/Restrictions  Precautions Precautions: Fall Required Braces or Orthoses: No Restrictions Weight Bearing Restrictions: No Prior Functioning  Home Living Lives With: Sheran Spine Help From: Family Type of Home: House Home Layout: One level Home Access: Stairs to enter [employer] of Steps: 4 Bathroom Toilet: Standard Bathroom Accessibility: Yes How Accessible: Accessible via walker Home Adaptive Equipment: Walker - rolling;Bedside commode/3-in-1 Prior Function Level of Independence: Needs assistance with ADLs;Needs assistance with homemaking;Needs assistance with gait;Needs assistance with tranfers Driving: No Vocation: Retired Producer, television/film/video: Awake/alert Overall Cognitive Status: Appears within functional limits for tasks assessed Sensation/Coordination Sensation Light Touch: Appears Intact Extremity Assessment RUE Assessment RUE Assessment: Within Functional Limits LUE Assessment LUE Assessment: Within Functional Limits RLE Assessment RLE Assessment: Exceptions to [facility] RLE Strength RLE Overall Strength: Due to pain;Deficits LLE Assessment LLE Assessment: Exceptions to [facility] LLE Strength LLE Overall Strength: Deficits;Due to pain Mobility (including Balance) Bed Mobility Bed Mobility: Yes Supine to Sit: HOB elevated (Comment degrees);3: Mod assist (HOB at 60 deg) Sitting - Scoot to Edge of Bed: 4: Min  assist Transfers Transfers: Yes Sit to Stand: 4: Min assist Stand to Sit: 4: Min assist Ambulation/Gait Ambulation/Gait: Yes Ambulation/Gait Assistance: 4: Min Environmental consultant (Feet): 6 Feet Assistive  device: Rolling walker Gait Pattern: Decreased step length - left;Decreased stance time - right;Decreased hip/knee flexion - right;Decreased hip/knee flexion - left;Decreased weight shift to right;Antalgic;Shuffle Gait velocity: extremely slow, labored gait with inability to lift either foot off of the floor Stairs: No Wheelchair Mobility Wheelchair Mobility: No  Posture/Postural Control Posture/Postural Control: Postural limitations Postural Limitations: severe thoracic kyphosis Balance Balance Assessed: Yes Dynamic Sitting Balance Dynamic Sitting - Level of Assistance: 5: Stand by assistance Exercise  General Exercises - Lower Extremity Ankle Circles/Pumps: AROM;Both;10 reps;Supine Quad Sets: AROM;Both;10 reps;Supine Gluteal Sets: AROM;Both;10 reps;Supine Heel Slides: AAROM;Both;10 reps;Supine Hip ABduction/ADduction: AAROM;Both;10 reps;Supine End of Session PT - End of Session Equipment Utilized During Treatment: Gait belt Activity Tolerance: Patient tolerated treatment well Patient left: in chair;with call bell in reach Nurse Communication: Mobility status for transfers;Mobility status for ambulation General Behavior During Session: [facility] for tasks performed Cognition: [facility] for tasks performed  Konrad Penta 09/26/2011, 12:43 PM

## 2011-09-26 NOTE — Progress Notes (Signed)
Subjective: Patient feels much better today. Discussed plan of care with the daughter. No new complaints.  Objective: Weight change:   Intake/Output Summary (Last 24 hours) at 09/26/11 1040 Last data filed at 09/26/11 0941  Gross per 24 hour  Intake    610 ml  Output   2075 ml  Net  -1465 ml    Physical Exam: General: Alert and awake oriented x3 not in any acute distress. HEENT: anicteric sclera, pupils reactive to light and accommodation CVS: S1-S2 clear no murmur rubs or gallops Chest: clear to auscultation bilaterally, no wheezing rales or rhonchi Abdomen: soft nontender, nondistended, normal bowel sounds, no organomegaly Extremities: no cyanosis, clubbing or edema noted bilaterally Neuro: Cranial nerves II-XII intact, no focal neurological deficits  Lab Results:  Basename 09/26/11 0516 09/25/11 1443 09/25/11 1430  NA 133* 121* --  K 4.0 4.1 --  CL 101 87* --  CO2 22 22 --  GLUCOSE 95 118* --  BUN 9 16 --  CREATININE 0.72 1.10 --  CALCIUM 8.2* 8.7 --  MG -- -- 1.9  PHOS -- -- --    Basename 09/26/11 0516 09/25/11 1443  AST 27 29  ALT 14 14  ALKPHOS 52 56  BILITOT 0.9 0.9  PROT 5.4* 5.7*  ALBUMIN 3.0* 3.4*   No results found for this basename: LIPASE:2,AMYLASE:2 in the last 72 hours  Basename 09/26/11 0516 09/25/11 1443  WBC 4.4 6.8  NEUTROABS -- 5.0  HGB 10.0* 10.5*  HCT 28.5* 29.2*  MCV 88.2 88.8  PLT 198 192    Basename 09/25/11 1443  CKTOTAL 285*  CKMB 4.9*  CKMBINDEX --  TROPONINI <0.30   No results found for this basename: POCBNP:3 in the last 72 hours No results found for this basename: DDIMER:2 in the last 72 hours No results found for this basename: HGBA1C:2 in the last 72 hours No results found for this basename: CHOL:2,HDL:2,LDLCALC:2,TRIG:2,CHOLHDL:2,LDLDIRECT:2 in the last 72 hours No results found for this basename: TSH,T4TOTAL,FREET3,T3FREE,THYROIDAB in the last 72 hours No results found for this basename:  VITAMINB12:2,FOLATE:2,FERRITIN:2,TIBC:2,IRON:2,RETICCTPCT:2 in the last 72 hours  Micro Results: No results found for this or any previous visit (from the past 240 hour(s)).  Studies/Results: Dg Chest 1 View  09/25/2011  *RADIOLOGY REPORT*  Clinical Data: Right side weakness, hypertension  CHEST - 1 VIEW  Comparison: 09/20/2011  Findings: Due to recent pelvic fracture, unable to stand for upright PA and lateral exam. Enlargement of cardiac silhouette. Mediastinal contours and pulmonary vascularity normal. Emphysematous changes without infiltrate or effusion. Minimal biapical scarring. No pneumothorax. Diffuse osseous demineralization. Prior vertebroplasty at thoracolumbar junction. Old mid right clavicular fracture.  IMPRESSION: Enlargement of cardiac silhouette. Emphysematous changes. No acute abnormalities.  Original Report Authenticated By: Lollie Marrow, M.D.   Dg Chest 1 View  09/20/2011  *RADIOLOGY REPORT*  Clinical Data: Trauma.  Fall.  CHEST - 1 VIEW  Comparison: None.  Findings: Hyperexpansion is consistent with emphysema.  No evidence for pneumothorax. The cardiopericardial silhouette is enlarged. Bones are demineralized.  Old right clavicle fracture noted.  IMPRESSION: Emphysema with cardiomegaly.  Original Report Authenticated By: ERIC A. MANSELL, M.D.   Dg Hip Complete Right  09/20/2011  *RADIOLOGY REPORT*  Clinical Data: Fall.  Pain.  RIGHT HIP - COMPLETE 2+ VIEW  Comparison: None.  Findings: Frontal pelvis shows marked bony demineralization. Diffuse gaseous bowel distention obscures fine bony detail over the sacrum and medial pelvis.  No acute fracture can be identified. The patient has a left hip hemiarthroplasty although  the entire prosthesis is not been visualized.  IMPRESSION: Limited study without acute bony findings.  Original Report Authenticated By: ERIC A. MANSELL, M.D.   Ct Head Wo Contrast  09/25/2011  *RADIOLOGY REPORT*  Clinical Data: Right side weakness, fell on  09/20/2011  CT HEAD WITHOUT CONTRAST  Technique:  Contiguous axial images were obtained from the base of the skull through the vertex without contrast.  Comparison: 09/20/2011  Findings: Generalized atrophy. Normal ventricular morphology. No midline shift or mass effect. Minimal small vessel chronic ischemic changes of deep cerebral white matter. No intracranial hemorrhage, mass lesion or evidence of acute infarction. No extra-axial fluid collection. Visualized paranasal sinuses and mastoid air cells clear. Bones demineralized.  IMPRESSION: Atrophy with minimal small vessel chronic ischemic changes of deep cerebral white matter. No acute intracranial abnormalities.  Original Report Authenticated By: Lollie Marrow, M.D.   Ct Head Wo Contrast  09/20/2011  *RADIOLOGY REPORT*  Clinical Data: Larey Seat prior to arrival.  Dizziness, weakness, wearing C-collar.  CT HEAD WITHOUT CONTRAST  Technique:  Contiguous axial images were obtained from the base of the skull through the vertex without contrast.  Comparison: None.  Findings: Mild diffuse cerebral atrophy.  No mass effect or midline shift.  No abnormal extra-axial fluid collections.  Old lacunar infarct in the periventricular white matter on the left.  Scattered low attenuation changes in the deep white matter consistent with small vessel ischemia.  Gray-white matter junctions are distinct. Basal cisterns are not effaced.  No evidence of acute intracranial hemorrhage.  Intracranial arterial vascular calcifications. Visualized paranasal sinuses are not opacified.  No depressed skull fractures.  IMPRESSION: Chronic atrophy and small vessel ischemic changes.  No evidence of acute intracranial hemorrhage, mass lesion, or acute infarct.  Original Report Authenticated By: Marlon Pel, M.D.   Ct Cervical Spine Wo Contrast  09/20/2011  *RADIOLOGY REPORT*  Clinical Data: Larey Seat prior to arrival.  Dizziness, weakness, wearing C-collar.  CT CERVICAL SPINE WITHOUT CONTRAST   Technique:  Multidetector CT imaging of the cervical spine was performed. Multiplanar CT image reconstructions were also generated.  Comparison: None.  Findings: Degenerative changes with narrowed C4-5, C5-6, and C6-7 interspaces with associated endplate hypertrophic changes.  No endplate destruction.  Normal alignment of the cervical vertebrae, posterior elements, and facet joints.  No vertebral compression deformities.  No prevertebral soft tissue swelling.  Lateral masses of C1 are symmetrical.  The odontoid process is intact. Degenerative changes in the facet joints.  No significant paraspinal infiltration.  Vascular calcifications in the carotid arteries.  No significant cervical lymphadenopathy.  Calcification in the thyroid gland.  IMPRESSION: Degenerative changes in the cervical spine.  Normal alignment suggested.  No displaced fractures identified.  Original Report Authenticated By: Marlon Pel, M.D.   Mr Brain Wo Contrast  09/26/2011  *RADIOLOGY REPORT*  Clinical Data: Fall 5 days ago.  Pelvic fracture.  Onset of numbness right facial region and extending down right body with weakness.  MRI HEAD WITHOUT CONTRAST  Technique:  Multiplanar, multiecho pulse sequences of the brain and surrounding structures were obtained according to standard protocol without intravenous contrast.  Comparison: 09/25/2011 CT.  No comparison MR.  Findings: No acute infarct.  No intracranial hemorrhage.  No intracranial mass lesion detected on this unenhanced exam.  Mild small vessel disease type changes.  Mild global atrophy without hydrocephalus.  Major intracranial vascular structures are patent.  Minimal ethmoid sinus air cell mucosal thickening.  Shallow sella with small pituitary gland incidentally noted.  IMPRESSION:  No acute infarct.  Mild small vessel disease type changes.  Mild global atrophy.  Preliminary report at the time of imaging by Dr. Benard Rink.  Original Report Authenticated By: Fuller Canada, M.D.    Ct Hip Right Wo Contrast  09/20/2011  *RADIOLOGY REPORT*  Clinical Data: Larey Seat today with right hip pain, worsening since earlier.  CT OF THE RIGHT HIP WITHOUT CONTRAST  Technique:  Multidetector CT imaging was performed according to the standard protocol. Multiplanar CT image reconstructions were also generated.  Comparison: Plain films 09/20/2011  Findings: There is a comminuted displaced fracture of the right anterior iliac wing without involvement of the sacroiliac joint or acetabulum.  There is associated hematoma in the soft tissues.  The right hip, and the superior and inferior right pubic rami appear intact.  IMPRESSION: Comminuted displaced fracture of the right anterior iliac wing.  Original Report Authenticated By: Marlon Pel, M.D.   Medications: Scheduled Meds:   . sodium chloride   Intravenous Once  . aspirin  81 mg Oral Daily  . cefTRIAXone (ROCEPHIN) IV  1 g Intravenous Q24H  . heparin  5,000 Units Subcutaneous Q8H  .  HYDROmorphone (DILAUDID) injection  0.5 mg Intravenous Once  . levothyroxine  112 mcg Oral QAC breakfast  . lisinopril  40 mg Oral Daily  . pantoprazole  40 mg Oral Q1200  . polyethylene glycol  17 g Oral Daily  . QUEtiapine  100 mg Oral QHS   Continuous Infusions:   . sodium chloride 75 mL/hr at 09/25/11 2240   PRN Meds:.acetaminophen, HYDROcodone-acetaminophen, HYDROmorphone, ondansetron (ZOFRAN) IV, ondansetron  Assessment/Plan:  Hyponatremia: Patient's hyponatremia has improved. Her sodium today is 133. Her hyponatremia was most likely secondary to dehydration. Her urinary sodium was less than 10. Discussed this with her family.  Acute cystitis:  Patient was also noted to have bladder infection started her on Rocephin 1 g IV every 24. Discontinued her Foley catheter. We'll get urine culture.  Hypertension: Patient had low blood pressures outpatient most likely secondary to dehydration. Will restart her Cardizem today her blood pressure  issues have resolved.  Pelvic fracture: Will continue physical therapy while patient is in the hospital discussed with her daughter that most likely she will be able to go home in another day or 2.  Dysphagia: Patient does complain of dysphagia to solids. Will consult GI for possible esophageal validation.  Disposition: Most likely patient will be ready for discharge and within another day or 2. Discussed this with the family.       LOS: 1 day   Earlene Plater MD, [place] Pier 09/26/2011, 10:40 AM

## 2011-09-26 NOTE — H&P (Signed)
PCP:   Dwana Melena, MD   Chief Complaint:  Weakness, right facial numbness.  HPI: Patient is a 77 year old white female with past medical history significant for hypertension hypothyroidism GERD dysphagia anxiety and osteopenia. Patient was discharged from the hospital 3 days ago secondary to a pelvic fracture. Patient was not a surgical candidate. Patient was treated with physical therapy and discharged home with physical therapy. Patient stated that she was feeling well until about noon today. She started feeling weak with pain on her right side. The pain is located in the shoulder and hip. She thinks this is the side she fell on a few days ago when she sustained a fracture. She also complains of numbness in the right side of her face. She does not have any speech deficit, no vision changes, no focal weakness or balance problems. She does complain of problems swallowing solid foods. She stated that she had this problem in the past and have to have esophageal dilation.  She also complains of being constipated. She has not had a bowel movement for the past 3 days. Patient's daughter also stated that during physical therapy 2 days ago she was noted to have a low blood pressure systolic in the 80s. Patient is taking antihypertensive medication. In the emergency room patient was noted to be hyponatremic so we were asked to admit patient for further management.    Review of Systems:  The patient denies anorexia, fever, weight loss,, vision loss, decreased hearing, hoarseness, chest pain, syncope, dyspnea on exertion, peripheral edema, balance deficits, hemoptysis, abdominal pain, melena, hematochezia, severe indigestion/heartburn, hematuria, incontinence, genital sores, muscle weakness, suspicious skin lesions, transient blindness, difficulty walking, depression, unusual weight change, abnormal bleeding, enlarged lymph nodes, angioedema, and breast masses.  Past Medical History: Past Medical History    Diagnosis Date  . Thyroid disease   . HTN (hypertension)   . Reflux   . Anxiety   . Osteopenia   . GERD (gastroesophageal reflux disease)   . Hypothyroidism   . Emphysema 09/21/2011    Per CXR  . Dysphagia 09/21/2011  . Chest pain 09/21/2011  . Schizophrenia   . Polydipsia 09/21/2011  . 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 Left shoulder   . Right hip replacement   . Thyroidectomy   . Vertebroplasty   . Hip surgery Lt    Medications: Prior to Admission medications   Medication Sig Start Date End Date Taking? Authorizing Provider  alendronate (FOSAMAX) 70 MG tablet Take 70 mg by mouth every 7 (seven) days. On Fridays of each weekTake with a full glass of water on an empty stomach.    Yes Historical Provider, MD  aspirin 81 MG tablet Take 81 mg by mouth daily.     Yes Historical Provider, MD  diltiazem (CARDIZEM CD) 120 MG 24 hr capsule Take 120 mg by mouth at bedtime.     Yes Historical Provider, MD  HYDROcodone-acetaminophen (NORCO) 5-325 MG per tablet Take 0.5-2 tablets by mouth every 4 (four) hours as needed for pain. 09/23/11  Yes Corinna L Sullivan  levothyroxine (SYNTHROID, LEVOTHROID) 112 MCG tablet Take 112 mcg by mouth daily.     Yes Historical Provider, MD  lisinopril (PRINIVIL,ZESTRIL) 40 MG tablet Take 40 mg by mouth daily.     Yes Historical Provider, MD  QUEtiapine (SEROQUEL) 100 MG tablet Take 100 mg by mouth at bedtime.     Yes Historical Provider, MD  acetaminophen (TYLENOL) 325 MG tablet Take 650  mg by mouth every 6 (six) hours as needed. For pain    Historical Provider, MD  Calcium Carbonate Antacid (TUMS PO) Take 1 tablet by mouth daily as needed. For indigestion    Historical Provider, MD  Multiple Vitamins-Minerals (ONE-A-DAY VITACRAVES PO) Take 1 tablet by mouth daily.     Historical Provider, MD  omeprazole (PRILOSEC) 20 MG capsule Take 20 mg by mouth 3 (three) times daily with meals.     Historical Provider, MD     Allergies:   Allergies  Allergen Reactions  . Gluten     Social History: Reports that she has never smoked. She does not have any smokeless tobacco history on file. She reports that she does not drink alcohol or use illicit drugs. She is divorced and has 3 children 2 girls and one boy.  Family History: Family History  Problem Relation Age of Onset  . Cancer    . Kidney disease      Physical Exam: Filed Vitals:   09/25/11 2106 09/25/11 2143 09/26/11 0500 09/26/11 0525  BP: 119/71 132/76  154/85  Pulse: 72 70  104  Temp:  98 F (36.7 C)  98.6 F (37 C)  TempSrc:      Resp:  16  20  Height:  5' (1.524 m)    Weight:  48.172 kg (106 lb 3.2 oz)  49.397 kg (108 lb 14.4 oz)  SpO2: 96% 98% 97% 94%   General appearance: alert, appears stated age and no distress Head: Normocephalic, without obvious abnormality, atraumatic Eyes: conjunctivae/corneas clear. PERRL, EOM's intact. Fundi benign. Ears: normal TM's and external ear canals both ears Nose: Nares normal. Septum midline. Mucosa normal. No drainage or sinus tenderness. Throat: lips, mucosa, and tongue normal; teeth and gums normal Chest wall: no tenderness Cardio: regular rate and rhythm, S1, S2 normal, no murmur, click, rub or gallop Extremities: extremities normal, atraumatic, no cyanosis or edema Neurologic: Alert and oriented X 3, normal strength and tone. Normal symmetric reflexes. Normal coordination and gait: Not tested  Labs on Admission:   [facility] 09/26/11 0516 09/25/11 1443 09/25/11 1430  NA 133* 121* --  K 4.0 4.1 --  CL 101 87* --  CO2 22 22 --  GLUCOSE 95 118* --  BUN 9 16 --  CREATININE 0.72 1.10 --  CALCIUM 8.2* 8.7 --  MG -- -- 1.9  PHOS -- -- --    Basename 09/26/11 0516 09/25/11 1443  AST 27 29  ALT 14 14  ALKPHOS 52 56  BILITOT 0.9 0.9  PROT 5.4* 5.7*  ALBUMIN 3.0* 3.4*   No results found for this basename: LIPASE:2,AMYLASE:2 in the last 72 hours  Basename 09/26/11 0516 09/25/11  1443  WBC 4.4 6.8  NEUTROABS -- 5.0  HGB 10.0* 10.5*  HCT 28.5* 29.2*  MCV 88.2 88.8  PLT 198 192    Basename 09/25/11 1443  CKTOTAL 285*  CKMB 4.9*  CKMBINDEX --  TROPONINI <0.30   No results found for this basename: TSH,T4TOTAL,FREET3,T3FREE,THYROIDAB in the last 72 hours No results found for this basename: VITAMINB12:2,FOLATE:2,FERRITIN:2,TIBC:2,IRON:2,RETICCTPCT:2 in the last 72 hours  Radiological Exams on Admission: Dg Chest 1 View  09/25/2011  *RADIOLOGY REPORT*  Clinical Data: Right side weakness, hypertension  CHEST - 1 VIEW  Comparison: 09/20/2011  Findings: Due to recent pelvic fracture, unable to stand for upright PA and lateral exam. Enlargement of cardiac silhouette. Mediastinal contours and pulmonary vascularity normal. Emphysematous changes without infiltrate or effusion. Minimal biapical scarring. No pneumothorax. Diffuse osseous  demineralization. Prior vertebroplasty at thoracolumbar junction. Old mid right clavicular fracture.  IMPRESSION: Enlargement of cardiac silhouette. Emphysematous changes. No acute abnormalities.  Original Report Authenticated By: Lollie Marrow, M.D.   Dg Chest 1 View  09/20/2011  *RADIOLOGY REPORT*  Clinical Data: Trauma.  Fall.  CHEST - 1 VIEW  Comparison: None.  Findings: Hyperexpansion is consistent with emphysema.  No evidence for pneumothorax. The cardiopericardial silhouette is enlarged. Bones are demineralized.  Old right clavicle fracture noted.  IMPRESSION: Emphysema with cardiomegaly.  Original Report Authenticated By: ERIC A. MANSELL, M.D.   Dg Hip Complete Right  09/20/2011  *RADIOLOGY REPORT*  Clinical Data: Fall.  Pain.  RIGHT HIP - COMPLETE 2+ VIEW  Comparison: None.  Findings: Frontal pelvis shows marked bony demineralization. Diffuse gaseous bowel distention obscures fine bony detail over the sacrum and medial pelvis.  No acute fracture can be identified. The patient has a left hip hemiarthroplasty although the entire  prosthesis is not been visualized.  IMPRESSION: Limited study without acute bony findings.  Original Report Authenticated By: ERIC A. MANSELL, M.D.   Ct Head Wo Contrast  09/25/2011  *RADIOLOGY REPORT*  Clinical Data: Right side weakness, fell on 09/20/2011  CT HEAD WITHOUT CONTRAST  Technique:  Contiguous axial images were obtained from the base of the skull through the vertex without contrast.  Comparison: 09/20/2011  Findings: Generalized atrophy. Normal ventricular morphology. No midline shift or mass effect. Minimal small vessel chronic ischemic changes of deep cerebral white matter. No intracranial hemorrhage, mass lesion or evidence of acute infarction. No extra-axial fluid collection. Visualized paranasal sinuses and mastoid air cells clear. Bones demineralized.  IMPRESSION: Atrophy with minimal small vessel chronic ischemic changes of deep cerebral white matter. No acute intracranial abnormalities.  Original Report Authenticated By: Lollie Marrow, M.D.   Ct Head Wo Contrast  09/20/2011  *RADIOLOGY REPORT*  Clinical Data: Larey Seat prior to arrival.  Dizziness, weakness, wearing C-collar.  CT HEAD WITHOUT CONTRAST  Technique:  Contiguous axial images were obtained from the base of the skull through the vertex without contrast.  Comparison: None.  Findings: Mild diffuse cerebral atrophy.  No mass effect or midline shift.  No abnormal extra-axial fluid collections.  Old lacunar infarct in the periventricular white matter on the left.  Scattered low attenuation changes in the deep white matter consistent with small vessel ischemia.  Gray-white matter junctions are distinct. Basal cisterns are not effaced.  No evidence of acute intracranial hemorrhage.  Intracranial arterial vascular calcifications. Visualized paranasal sinuses are not opacified.  No depressed skull fractures.  IMPRESSION: Chronic atrophy and small vessel ischemic changes.  No evidence of acute intracranial hemorrhage, mass lesion, or acute  infarct.  Original Report Authenticated By: Marlon Pel, M.D.   Ct Cervical Spine Wo Contrast  09/20/2011  *RADIOLOGY REPORT*  Clinical Data: Larey Seat prior to arrival.  Dizziness, weakness, wearing C-collar.  CT CERVICAL SPINE WITHOUT CONTRAST  Technique:  Multidetector CT imaging of the cervical spine was performed. Multiplanar CT image reconstructions were also generated.  Comparison: None.  Findings: Degenerative changes with narrowed C4-5, C5-6, and C6-7 interspaces with associated endplate hypertrophic changes.  No endplate destruction.  Normal alignment of the cervical vertebrae, posterior elements, and facet joints.  No vertebral compression deformities.  No prevertebral soft tissue swelling.  Lateral masses of C1 are symmetrical.  The odontoid process is intact. Degenerative changes in the facet joints.  No significant paraspinal infiltration.  Vascular calcifications in the carotid arteries.  No significant cervical lymphadenopathy.  Calcification in the thyroid gland.  IMPRESSION: Degenerative changes in the cervical spine.  Normal alignment suggested.  No displaced fractures identified.  Original Report Authenticated By: Marlon Pel, M.D.   Mr Brain Wo Contrast  09/26/2011  *RADIOLOGY REPORT*  Clinical Data: Fall 5 days ago.  Pelvic fracture.  Onset of numbness right facial region and extending down right body with weakness.  MRI HEAD WITHOUT CONTRAST  Technique:  Multiplanar, multiecho pulse sequences of the brain and surrounding structures were obtained according to standard protocol without intravenous contrast.  Comparison: 09/25/2011 CT.  No comparison MR.  Findings: No acute infarct.  No intracranial hemorrhage.  No intracranial mass lesion detected on this unenhanced exam.  Mild small vessel disease type changes.  Mild global atrophy without hydrocephalus.  Major intracranial vascular structures are patent.  Minimal ethmoid sinus air cell mucosal thickening.  Shallow sella with  small pituitary gland incidentally noted.  IMPRESSION: No acute infarct.  Mild small vessel disease type changes.  Mild global atrophy.  Preliminary report at the time of imaging by Dr. Benard Rink.  Original Report Authenticated By: Fuller Canada, M.D.   Ct Hip Right Wo Contrast  09/20/2011  *RADIOLOGY REPORT*  Clinical Data: Larey Seat today with right hip pain, worsening since earlier.  CT OF THE RIGHT HIP WITHOUT CONTRAST  Technique:  Multidetector CT imaging was performed according to the standard protocol. Multiplanar CT image reconstructions were also generated.  Comparison: Plain films 09/20/2011  Findings: There is a comminuted displaced fracture of the right anterior iliac wing without involvement of the sacroiliac joint or acetabulum.  There is associated hematoma in the soft tissues.  The right hip, and the superior and inferior right pubic rami appear intact.  IMPRESSION: Comminuted displaced fracture of the right anterior iliac wing.  Original Report Authenticated By: Marlon Pel, M.D.    Assessment/Plan Generalized weakness: Patient will be admitted to the hospital. Head CT is normal. Will consult physical therapy for further treatment. Will hydrate her. No focal neurologic deficit.  Hyponatremia: Patient was hyponatremic on previous hospitalization. With hypotension noted during her physical therapy, patient is probably dehydrated. Will start her on normal saline at 75 cc an hour. Will check serum osmolarity and urine osmolarity. Will also check urinary sodium. Will check a random cortisol level as well.  Facial numbness: Patient's head CT is normal. Will get MRI of the head to evaluate for stroke.  Dysphagia: Will ask GI to see her or possibly dilation of the esophagus secondary to dysphagia to solids.  Hypothyroidism: Will check her TSH and continue her Synthroid. Earlene Plater MD, [place] 09/26/2011, 8:40 AM

## 2011-09-26 NOTE — Progress Notes (Signed)
UR Chart Review Completed  

## 2011-09-27 ENCOUNTER — Encounter (HOSPITAL_COMMUNITY): Payer: Self-pay | Admitting: *Deleted

## 2011-09-27 ENCOUNTER — Other Ambulatory Visit: Payer: Self-pay | Admitting: Internal Medicine

## 2011-09-27 ENCOUNTER — Encounter (HOSPITAL_COMMUNITY)
Admission: EM | Disposition: A | Discharge status: Home Health | Payer: Self-pay | Source: Home / Self Care | Attending: Internal Medicine

## 2011-09-27 DIAGNOSIS — E860 Dehydration: Secondary | ICD-10-CM | POA: Diagnosis present

## 2011-09-27 DIAGNOSIS — K449 Diaphragmatic hernia without obstruction or gangrene: Secondary | ICD-10-CM

## 2011-09-27 DIAGNOSIS — E871 Hypo-osmolality and hyponatremia: Secondary | ICD-10-CM | POA: Diagnosis present

## 2011-09-27 DIAGNOSIS — R531 Weakness: Secondary | ICD-10-CM | POA: Diagnosis present

## 2011-09-27 DIAGNOSIS — R1310 Dysphagia, unspecified: Secondary | ICD-10-CM

## 2011-09-27 DIAGNOSIS — F54 Psychological and behavioral factors associated with disorders or diseases classified elsewhere: Secondary | ICD-10-CM | POA: Diagnosis present

## 2011-09-27 DIAGNOSIS — R933 Abnormal findings on diagnostic imaging of other parts of digestive tract: Secondary | ICD-10-CM

## 2011-09-27 HISTORY — PX: ESOPHAGOGASTRODUODENOSCOPY: SHX5428

## 2011-09-27 LAB — CBC
HCT: 26.3 % — ABNORMAL LOW (ref 36.0–46.0)
MCH: 31.3 pg (ref 26.0–34.0)
MCHC: 35 g/dL (ref 30.0–36.0)
RDW: 13.4 % (ref 11.5–15.5)

## 2011-09-27 LAB — BASIC METABOLIC PANEL
BUN: 7 mg/dL (ref 6–23)
Chloride: 104 mEq/L (ref 96–112)
Creatinine, Ser: 0.67 mg/dL (ref 0.50–1.10)
GFR calc Af Amer: >90 mL/min (ref >90)
Glucose, Bld: 92 mg/dL (ref 70–99)

## 2011-09-27 SURGERY — EGD (ESOPHAGOGASTRODUODENOSCOPY)
Anesthesia: Moderate Sedation

## 2011-09-27 MED ORDER — MEPERIDINE HCL 100 MG/ML IJ SOLN
INTRAMUSCULAR | Status: AC
  Filled 2011-09-27: qty 1

## 2011-09-27 MED ORDER — SUCRALFATE 1 GM/10ML PO SUSP
1.0000 g | Freq: Three times a day (TID) | ORAL | Status: DC
  Administered 2011-09-27 – 2011-10-01 (×15): 1 g via ORAL
  Filled 2011-09-27 (×20): qty 10

## 2011-09-27 MED ORDER — PANTOPRAZOLE SODIUM 40 MG PO TBEC
40.0000 mg | DELAYED_RELEASE_TABLET | Freq: Two times a day (BID) | ORAL | Status: DC
  Administered 2011-09-27 – 2011-10-01 (×9): 40 mg via ORAL
  Filled 2011-09-27 (×12): qty 1

## 2011-09-27 MED ORDER — MIDAZOLAM HCL 5 MG/5ML IJ SOLN
INTRAMUSCULAR | Status: AC
  Filled 2011-09-27: qty 5

## 2011-09-27 MED ORDER — BISACODYL 10 MG RE SUPP
10.0000 mg | Freq: Every day | RECTAL | Status: DC | PRN
  Administered 2011-09-27: 10 mg via RECTAL
  Filled 2011-09-27: qty 1

## 2011-09-27 MED ORDER — HEPARIN SODIUM (PORCINE) 5000 UNIT/ML IJ SOLN
5000.0000 [IU] | Freq: Three times a day (TID) | INTRAMUSCULAR | Status: DC
  Administered 2011-09-28 – 2011-10-01 (×10): 5000 [IU] via SUBCUTANEOUS
  Filled 2011-09-27 (×12): qty 1

## 2011-09-27 MED ORDER — STERILE WATER FOR IRRIGATION IR SOLN
Status: DC | PRN
  Administered 2011-09-27: 10:00:00

## 2011-09-27 MED ORDER — MEPERIDINE HCL 100 MG/ML IJ SOLN
INTRAMUSCULAR | Status: DC | PRN
  Administered 2011-09-27: 25 mg

## 2011-09-27 MED ORDER — BUTAMBEN-TETRACAINE-BENZOCAINE 2-2-14 % EX AERO
INHALATION_SPRAY | CUTANEOUS | Status: DC | PRN
  Administered 2011-09-27: 1 via TOPICAL

## 2011-09-27 MED ORDER — MIDAZOLAM HCL 5 MG/5ML IJ SOLN
INTRAMUSCULAR | Status: DC | PRN
  Administered 2011-09-27: 2 mg via INTRAVENOUS

## 2011-09-27 NOTE — Progress Notes (Signed)
After Dulcolax suppository, pt had a large formed BM.

## 2011-09-27 NOTE — Progress Notes (Signed)
Spoke at length to pts daughter Tresa Endo concerning d/c plans. PT and family have agreed pt will be going home at d/c. [facility] will resume hh PT, aide.pt will need order for RN. Kelly request her na level be drawn by hh nurse in a day or two after d/c.

## 2011-09-27 NOTE — Progress Notes (Signed)
Notified Dr. Erlinda Hong Clung due to pt has stated that she has not had a BM in 8 days.  This is even with the Myra lax already ordered.   She does have abdominal tenderness with palpation.  New orders given and followed.

## 2011-09-27 NOTE — Progress Notes (Signed)
Subjective: The patient is feeling much better today.  She reports that she feels that her weakness is resolving.  She denies fever chills nausea vomiting abdominal pain chest pain or headache. She tolerated her EGD well today and has no complaints postoperatively.  Objective: Weight change: 5.04 kg (11 lb 1.8 oz)  Intake/Output Summary (Last 24 hours) at 09/27/11 1459 Last data filed at 09/27/11 0524  Gross per 24 hour  Intake   1845 ml  Output   1500 ml  Net    345 ml    Physical Exam: General: No acute respiratory distress. CVS: Regular rate and rhythm without murmur gallop or rub with normal S1-S2 Lungs: clear to auscultation bilaterally, no wheezing or crackles Abdomen: soft nontender, nondistended, normal bowel sounds, no organomegaly Extremities: no cyanosis, clubbing or edema noted bilaterally Neuro: Cranial nerves II-XII intact, no focal neurological deficits - alert and oriented x4  Lab Results:  Basename 09/27/11 0446 09/26/11 0516 09/25/11 1430  NA 133* 133* --  K 3.9 4.0 --  CL 104 101 --  CO2 23 22 --  GLUCOSE 92 95 --  BUN 7 9 --  CREATININE 0.67 0.72 --  CALCIUM 7.9* 8.2* --  MG -- -- 1.9  PHOS -- -- --    Basename 09/26/11 0516 09/25/11 1443  AST 27 29  ALT 14 14  ALKPHOS 52 56  BILITOT 0.9 0.9  PROT 5.4* 5.7*  ALBUMIN 3.0* 3.4*   No results found for this basename: LIPASE:2,AMYLASE:2 in the last 72 hours  Basename 09/27/11 0446 09/26/11 0516 09/25/11 1443  WBC 4.5 4.4 --  NEUTROABS -- -- 5.0  HGB 9.2* 10.0* --  HCT 26.3* 28.5* --  MCV 89.5 88.2 --  PLT 218 198 --    Basename 09/25/11 1443  CKTOTAL 285*  CKMB 4.9*  CKMBINDEX --  TROPONINI <0.30   No results found for this basename: POCBNP:3 in the last 72 hours No results found for this basename: DDIMER:2 in the last 72 hours No results found for this basename: HGBA1C:2 in the last 72 hours No results found for this basename: CHOL:2,HDL:2,LDLCALC:2,TRIG:2,CHOLHDL:2,LDLDIRECT:2 in the  last 72 hours No results found for this basename: TSH,T4TOTAL,FREET3,T3FREE,THYROIDAB in the last 72 hours No results found for this basename: VITAMINB12:2,FOLATE:2,FERRITIN:2,TIBC:2,IRON:2,RETICCTPCT:2 in the last 72 hours  Micro Results: No results found for this or any previous visit (from the past 240 hour(s)).  Studies/Results: Dg Chest 1 View  09/25/2011  *RADIOLOGY REPORT*  Clinical Data: Right side weakness, hypertension  CHEST - 1 VIEW  Comparison: 09/20/2011  Findings: Due to recent pelvic fracture, unable to stand for upright PA and lateral exam. Enlargement of cardiac silhouette. Mediastinal contours and pulmonary vascularity normal. Emphysematous changes without infiltrate or effusion. Minimal biapical scarring. No pneumothorax. Diffuse osseous demineralization. Prior vertebroplasty at thoracolumbar junction. Old mid right clavicular fracture.  IMPRESSION: Enlargement of cardiac silhouette. Emphysematous changes. No acute abnormalities.  Original Report Authenticated By: Lollie Marrow, M.D.    Ct Head Wo Contrast  09/25/2011  *RADIOLOGY REPORT*  Clinical Data: Right side weakness, fell on 09/20/2011  CT HEAD WITHOUT CONTRAST  Technique:  Contiguous axial images were obtained from the base of the skull through the vertex without contrast.  Comparison: 09/20/2011  Findings: Generalized atrophy. Normal ventricular morphology. No midline shift or mass effect. Minimal small vessel chronic ischemic changes of deep cerebral white matter. No intracranial hemorrhage, mass lesion or evidence of acute infarction. No extra-axial fluid collection. Visualized paranasal sinuses and mastoid air cells clear.  Bones demineralized.  IMPRESSION: Atrophy with minimal small vessel chronic ischemic changes of deep cerebral white matter. No acute intracranial abnormalities.  Original Report Authenticated By: Lollie Marrow, M.D.   Mr Brain Wo Contrast  09/26/2011  *RADIOLOGY REPORT*  Clinical Data: Fall 5 days  ago.  Pelvic fracture.  Onset of numbness right facial region and extending down right body with weakness.  MRI HEAD WITHOUT CONTRAST  Technique:  Multiplanar, multiecho pulse sequences of the brain and surrounding structures were obtained according to standard protocol without intravenous contrast.  Comparison: 09/25/2011 CT.  No comparison MR.  Findings: No acute infarct.  No intracranial hemorrhage.  No intracranial mass lesion detected on this unenhanced exam.  Mild small vessel disease type changes.  Mild global atrophy without hydrocephalus.  Major intracranial vascular structures are patent.  Minimal ethmoid sinus air cell mucosal thickening.  Shallow sella with small pituitary gland incidentally noted.  IMPRESSION: No acute infarct.  Mild small vessel disease type changes.  Mild global atrophy.  Preliminary report at the time of imaging by Dr. Benard Rink.  Original Report Authenticated By: Fuller Canada, M.D.   Medications: Scheduled Meds:    . aspirin  81 mg Oral Daily  . cefTRIAXone (ROCEPHIN) IV  1 g Intravenous Q24H  . diltiazem  120 mg Oral Daily  . heparin  5,000 Units Subcutaneous Q8H  . levothyroxine  112 mcg Oral QAC breakfast  . lisinopril  40 mg Oral Daily  . meperidine      . midazolam      . pantoprazole  40 mg Oral BID  . polyethylene glycol  17 g Oral Daily  . QUEtiapine  100 mg Oral QHS  . sucralfate  1 g Oral TID WC & HS  . DISCONTD: sodium chloride   Intravenous Once  . DISCONTD: heparin  5,000 Units Subcutaneous Q8H  . DISCONTD: pantoprazole  40 mg Oral Q1200   Continuous Infusions:    . sodium chloride 75 mL/hr at 09/27/11 0111   PRN Meds:.acetaminophen, HYDROcodone-acetaminophen, HYDROmorphone, ondansetron (ZOFRAN) IV, ondansetron, DISCONTD: butamben-tetracaine-benzocaine, DISCONTD: meperidine, DISCONTD: midazolam, DISCONTD: simethicone susp in sterile water 1000 mL irrigation  Assessment/Plan:  Hyponatremia: Patient's hyponatremia has improved. Her sodium  today remains 133. Her hyponatremia was most likely secondary to dehydration, but review of old records raises the question of psychogenic polydipsia. Her urinary sodium was less than 10. We will continue to follow her I.'s and O.'s as well as her sodium throughout her hospital stay.  Acute cystitis: We are waiting on results from a urine culture. We will continue empiric Rocephin at 1 g IV daily.  Hypertension: Her blood pressure is trending up. We will resume her usual outpatient blood pressure medications.  Pelvic fracture: Will continue physical therapy while patient is in the hospital. She will likely need home health for ongoing physical therapy and occupational therapy.  Dysphagia: GI has evaluated patient and completed an EGD today.  Please see the procedure note for the details of the EGD. At this time GI feels an upper GI series is indicated and this will be ordered for tomorrow.  Disposition: The patient is stabilizing nicely. Her ultimate discharge disposition will depend on her sodium level and the results of her upper GI.       LOS: 2 days   Breindy Meadow T 09/27/2011, 2:59 PM

## 2011-09-27 NOTE — Progress Notes (Signed)
Notified Tresa Endo the patients daughter in regards to the pts upper GI series and that it will be scheduled for 09/28/11.  I also voiced to her that it would depend on what her sodium level and her GI test showed to see if she would be discharged according to the MD.  She verbalizes understanding.

## 2011-09-28 DIAGNOSIS — K449 Diaphragmatic hernia without obstruction or gangrene: Secondary | ICD-10-CM

## 2011-09-28 DIAGNOSIS — R1319 Other dysphagia: Secondary | ICD-10-CM

## 2011-09-28 DIAGNOSIS — R1310 Dysphagia, unspecified: Secondary | ICD-10-CM

## 2011-09-28 LAB — CBC
Hemoglobin: 10.7 g/dL — ABNORMAL LOW (ref 12.0–15.0)
MCH: 31.8 pg (ref 26.0–34.0)
MCHC: 35.8 g/dL (ref 30.0–36.0)
Platelets: 260 10*3/uL (ref 150–400)
RDW: 13.1 % (ref 11.5–15.5)

## 2011-09-28 LAB — BASIC METABOLIC PANEL
BUN: 7 mg/dL (ref 6–23)
Calcium: 8.7 mg/dL (ref 8.4–10.5)
Creatinine, Ser: 0.58 mg/dL (ref 0.50–1.10)
GFR calc Af Amer: >90 mL/min (ref >90)
GFR calc non Af Amer: 85 mL/min — ABNORMAL LOW (ref >90)
Potassium: 3.7 mEq/L (ref 3.5–5.1)

## 2011-09-28 MED ORDER — LEVOFLOXACIN 250 MG PO TABS
250.0000 mg | ORAL_TABLET | Freq: Every day | ORAL | Status: AC
  Administered 2011-09-28 – 2011-09-30 (×3): 250 mg via ORAL
  Filled 2011-09-28 (×3): qty 1

## 2011-09-28 NOTE — Progress Notes (Signed)
Subjective: The patient reports that she suffered with polyuria last night. This is not typical problem for her. She was having difficulty with constipation yesterday but this quickly resolved with use of a suppository. She denies abdominal pain chest pain shortness of breath fevers chills or headache. She's been getting out of bed some but has not yet ambulated to a significant extent.  Objective: Weight change: -0.051 kg (-1.8 oz)  Intake/Output Summary (Last 24 hours) at 09/28/11 1205 Last data filed at 09/28/11 0900  Gross per 24 hour  Intake    280 ml  Output   1600 ml  Net  -1320 ml    Physical Exam: General: No acute respiratory distress. CVS: Regular rate and rhythm without murmur gallop or rub with normal S1-S2 Lungs: clear to auscultation bilaterally, no wheezing or crackles Abdomen: soft nontender, nondistended, normal bowel sounds, no organomegaly Extremities: no cyanosis, clubbing or edema noted bilaterally Neuro: Cranial nerves II-XII intact, no focal neurological deficits - alert and oriented x4  Lab Results:  Basename 09/28/11 0519 09/27/11 0446 09/25/11 1430  NA 133* 133* --  K 3.7 3.9 --  CL 103 104 --  CO2 20 23 --  GLUCOSE 95 92 --  BUN 7 7 --  CREATININE 0.58 0.67 --  CALCIUM 8.7 7.9* --  MG -- -- 1.9  PHOS -- -- --    Basename 09/26/11 0516 09/25/11 1443  AST 27 29  ALT 14 14  ALKPHOS 52 56  BILITOT 0.9 0.9  PROT 5.4* 5.7*  ALBUMIN 3.0* 3.4*   No results found for this basename: LIPASE:2,AMYLASE:2 in the last 72 hours  Basename 09/28/11 0519 09/27/11 0446 09/25/11 1443  WBC 3.9* 4.5 --  NEUTROABS -- -- 5.0  HGB 10.7* 9.2* --  HCT 29.9* 26.3* --  MCV 88.7 89.5 --  PLT 260 218 --    Basename 09/25/11 1443  CKTOTAL 285*  CKMB 4.9*  CKMBINDEX --  TROPONINI <0.30   No results found for this basename: POCBNP:3 in the last 72 hours No results found for this basename: DDIMER:2 in the last 72 hours No results found for this basename:  HGBA1C:2 in the last 72 hours No results found for this basename: CHOL:2,HDL:2,LDLCALC:2,TRIG:2,CHOLHDL:2,LDLDIRECT:2 in the last 72 hours No results found for this basename: TSH,T4TOTAL,FREET3,T3FREE,THYROIDAB in the last 72 hours No results found for this basename: VITAMINB12:2,FOLATE:2,FERRITIN:2,TIBC:2,IRON:2,RETICCTPCT:2 in the last 72 hours  Micro Results: No results found for this or any previous visit (from the past 240 hour(s)).  Studies/Results: Dg Chest 1 View  09/25/2011  *RADIOLOGY REPORT*  Clinical Data: Right side weakness, hypertension  CHEST - 1 VIEW  Comparison: 09/20/2011  Findings: Due to recent pelvic fracture, unable to stand for upright PA and lateral exam. Enlargement of cardiac silhouette. Mediastinal contours and pulmonary vascularity normal. Emphysematous changes without infiltrate or effusion. Minimal biapical scarring. No pneumothorax. Diffuse osseous demineralization. Prior vertebroplasty at thoracolumbar junction. Old mid right clavicular fracture.  IMPRESSION: Enlargement of cardiac silhouette. Emphysematous changes. No acute abnormalities.  Original Report Authenticated By: Lollie Marrow, M.D.    Ct Head Wo Contrast  09/25/2011  *RADIOLOGY REPORT*  Clinical Data: Right side weakness, fell on 09/20/2011  CT HEAD WITHOUT CONTRAST  Technique:  Contiguous axial images were obtained from the base of the skull through the vertex without contrast.  Comparison: 09/20/2011  Findings: Generalized atrophy. Normal ventricular morphology. No midline shift or mass effect. Minimal small vessel chronic ischemic changes of deep cerebral white matter. No intracranial hemorrhage, mass lesion  or evidence of acute infarction. No extra-axial fluid collection. Visualized paranasal sinuses and mastoid air cells clear. Bones demineralized.  IMPRESSION: Atrophy with minimal small vessel chronic ischemic changes of deep cerebral white matter. No acute intracranial abnormalities.  Original Report  Authenticated By: Lollie Marrow, M.D.   Mr Brain Wo Contrast  09/26/2011  *RADIOLOGY REPORT*  Clinical Data: Fall 5 days ago.  Pelvic fracture.  Onset of numbness right facial region and extending down right body with weakness.  MRI HEAD WITHOUT CONTRAST  Technique:  Multiplanar, multiecho pulse sequences of the brain and surrounding structures were obtained according to standard protocol without intravenous contrast.  Comparison: 09/25/2011 CT.  No comparison MR.  Findings: No acute infarct.  No intracranial hemorrhage.  No intracranial mass lesion detected on this unenhanced exam.  Mild small vessel disease type changes.  Mild global atrophy without hydrocephalus.  Major intracranial vascular structures are patent.  Minimal ethmoid sinus air cell mucosal thickening.  Shallow sella with small pituitary gland incidentally noted.  IMPRESSION: No acute infarct.  Mild small vessel disease type changes.  Mild global atrophy.  Preliminary report at the time of imaging by Dr. Benard Rink.  Original Report Authenticated By: Fuller Canada, M.D.   Medications: Scheduled Meds:    . aspirin  81 mg Oral Daily  . cefTRIAXone (ROCEPHIN) IV  1 g Intravenous Q24H  . diltiazem  120 mg Oral Daily  . heparin  5,000 Units Subcutaneous Q8H  . levothyroxine  112 mcg Oral QAC breakfast  . lisinopril  40 mg Oral Daily  . pantoprazole  40 mg Oral BID  . polyethylene glycol  17 g Oral Daily  . QUEtiapine  100 mg Oral QHS  . sucralfate  1 g Oral TID WC & HS  . DISCONTD: meperidine      . DISCONTD: midazolam       Continuous Infusions:    . sodium chloride 1,000 mL (09/28/11 0607)   PRN Meds:.acetaminophen, bisacodyl, HYDROcodone-acetaminophen, HYDROmorphone, ondansetron (ZOFRAN) IV, ondansetron  Assessment/Plan:  Hyponatremia: Patient's hyponatremia has improved. Her sodium today remains 133. Her hyponatremia was most likely secondary to dehydration, but review of old records raises the question of psychogenic  polydipsia. Her urinary sodium was less than 10. We will continue to follow her I.'s and O.'s as well as her sodium throughout her hospital stay. Given her polyuria I will now discontinue IV fluids. It will be interesting to see how this affects her sodium level.  Acute cystitis: Unfortunately it appears that her urine culture has not been accomplished. We will continue empiric antibiotic but transition to an oral therapy.  Hypertension: Her blood pressure is trending up. I will discontinue her IV fluid and resume her usual outpatient blood pressure medications.  Pelvic fracture: Will continue physical therapy while patient is in the hospital. She will likely need home health for ongoing physical therapy and occupational therapy.  Dysphagia: GI has evaluated patient and completed an EGD.  Please see the procedure note for the details of the EGD. At this time GI feels an upper GI series is indicated.  This has been ordered and is currently pending.  Disposition: The patient is stabilizing nicely. Her ultimate discharge disposition will depend on her sodium level stability and the results of her upper GI.       LOS: 3 days   Yan Pankratz T 09/28/2011, 12:05 PM

## 2011-09-28 NOTE — Progress Notes (Signed)
Subjective; patient states that she was able to swallow her breakfast and lunch without any difficulty. She denies dysphagia or odynophagia or abdominal pain. Objective BP 106/66  Pulse 65  Temp(Src) 97.7 F (36.5 C) (Oral)  Resp 18  Ht 5' (1.524 m)  Wt 111 lb (50.349 kg)  BMI 21.68 kg/m2  SpO2 99% She appears very comfortable sitting in a chair. Lab data Hemoglobin 10.7 hematocrit 29.9. Serum sodium 133 potassium 3.9 chloride 104 CO2 23 glucose 92 BUN 7 creatinine 0.67. Assessment #1. She is able swallow better. She had EGD yesterday by Dr. Jena Gauss revealing  pill esophagitis and poth sliding and paraesophageal hernia. She'll have upper GI series on 09/30/2011.  #2. Anemia. H&H stable; possibly chronic disease anemia. #3. Hyponatremia much improved. Serum sodium has come up from 121 to 133. 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 Recommendations Upper GI series as planned on 09/30/2011.

## 2011-09-29 LAB — BASIC METABOLIC PANEL
CO2: 21 mEq/L (ref 19–32)
Calcium: 8.4 mg/dL (ref 8.4–10.5)
Chloride: 98 mEq/L (ref 96–112)
Glucose, Bld: 92 mg/dL (ref 70–99)
Sodium: 127 mEq/L — ABNORMAL LOW (ref 135–145)

## 2011-09-29 MED ORDER — NITROGLYCERIN 0.4 MG SL SUBL
SUBLINGUAL_TABLET | SUBLINGUAL | Status: AC
  Filled 2011-09-29: qty 25

## 2011-09-29 MED ORDER — NITROGLYCERIN 0.4 MG SL SUBL: 0.4000 mg | SUBLINGUAL_TABLET | SUBLINGUAL | Status: DC | PRN

## 2011-09-29 MED ORDER — SODIUM CHLORIDE 0.9 % IV SOLN: INTRAVENOUS | Status: DC

## 2011-09-29 NOTE — Progress Notes (Signed)
Subjective: The patient feels much better today. She is quite anxious to go home. She denies abdominal pain chest pain shortness of breath fevers chills or headache. She is moving her bowels regularly and is currently tolerating her diet without difficulty.  Objective: Weight change: -2.767 kg (-6 lb 1.6 oz)  Intake/Output Summary (Last 24 hours) at 09/29/11 1124 Last data filed at 09/29/11 0500  Gross per 24 hour  Intake    460 ml  Output      0 ml  Net    460 ml   BP 128/69  Pulse 88  Temp(Src) 97.4 F (36.3 C) (Oral)  Resp 16  Ht 5' (1.524 m)  Wt 47.582 kg (104 lb 14.4 oz)  BMI 20.49 kg/m2  SpO2 98%  Physical Exam: General: No acute respiratory distress. CVS: Regular rate and rhythm without murmur gallop or rub with normal S1-S2 Lungs: clear to auscultation bilaterally, no wheezing or crackles Abdomen: soft nontender, nondistended, normal bowel sounds, no organomegaly Extremities: no cyanosis, clubbing or edema noted bilaterally  Lab Results:  Basename 09/29/11 0326 09/28/11 0519  NA 127* 133*  K 3.5 3.7  CL 98 103  CO2 21 20  GLUCOSE 92 95  BUN 6 7  CREATININE 0.66 0.58  CALCIUM 8.4 8.7  MG -- --  PHOS -- --   No results found for this basename: AST:2,ALT:2,ALKPHOS:2,BILITOT:2,PROT:2,ALBUMIN:2 in the last 72 hours No results found for this basename: LIPASE:2,AMYLASE:2 in the last 72 hours  Basename 09/28/11 0519 09/27/11 0446  WBC 3.9* 4.5  NEUTROABS -- --  HGB 10.7* 9.2*  HCT 29.9* 26.3*  MCV 88.7 89.5  PLT 260 218   No results found for this basename: CKTOTAL:3,CKMB:3,CKMBINDEX:3,TROPONINI:3 in the last 72 hours No results found for this basename: POCBNP:3 in the last 72 hours No results found for this basename: DDIMER:2 in the last 72 hours No results found for this basename: HGBA1C:2 in the last 72 hours No results found for this basename: CHOL:2,HDL:2,LDLCALC:2,TRIG:2,CHOLHDL:2,LDLDIRECT:2 in the last 72 hours No results found for this basename:  TSH,T4TOTAL,FREET3,T3FREE,THYROIDAB in the last 72 hours No results found for this basename: VITAMINB12:2,FOLATE:2,FERRITIN:2,TIBC:2,IRON:2,RETICCTPCT:2 in the last 72 hours  Micro Results: No results found for this or any previous visit (from the past 240 hour(s)).  Studies/Results: Dg Chest 1 View  09/25/2011  *RADIOLOGY REPORT*  Clinical Data: Right side weakness, hypertension  CHEST - 1 VIEW  Comparison: 09/20/2011  Findings: Due to recent pelvic fracture, unable to stand for upright PA and lateral exam. Enlargement of cardiac silhouette. Mediastinal contours and pulmonary vascularity normal. Emphysematous changes without infiltrate or effusion. Minimal biapical scarring. No pneumothorax. Diffuse osseous demineralization. Prior vertebroplasty at thoracolumbar junction. Old mid right clavicular fracture.  IMPRESSION: Enlargement of cardiac silhouette. Emphysematous changes. No acute abnormalities.  Original Report Authenticated By: Lollie Marrow, M.D.    Ct Head Wo Contrast  09/25/2011  *RADIOLOGY REPORT*  Clinical Data: Right side weakness, fell on 09/20/2011  CT HEAD WITHOUT CONTRAST  Technique:  Contiguous axial images were obtained from the base of the skull through the vertex without contrast.  Comparison: 09/20/2011  Findings: Generalized atrophy. Normal ventricular morphology. No midline shift or mass effect. Minimal small vessel chronic ischemic changes of deep cerebral white matter. No intracranial hemorrhage, mass lesion or evidence of acute infarction. No extra-axial fluid collection. Visualized paranasal sinuses and mastoid air cells clear. Bones demineralized.  IMPRESSION: Atrophy with minimal small vessel chronic ischemic changes of deep cerebral white matter. No acute intracranial abnormalities.  Original  Report Authenticated By: Lollie Marrow, M.D.   Mr Brain Wo Contrast  09/26/2011  *RADIOLOGY REPORT*  Clinical Data: Fall 5 days ago.  Pelvic fracture.  Onset of numbness right  facial region and extending down right body with weakness.  MRI HEAD WITHOUT CONTRAST  Technique:  Multiplanar, multiecho pulse sequences of the brain and surrounding structures were obtained according to standard protocol without intravenous contrast.  Comparison: 09/25/2011 CT.  No comparison MR.  Findings: No acute infarct.  No intracranial hemorrhage.  No intracranial mass lesion detected on this unenhanced exam.  Mild small vessel disease type changes.  Mild global atrophy without hydrocephalus.  Major intracranial vascular structures are patent.  Minimal ethmoid sinus air cell mucosal thickening.  Shallow sella with small pituitary gland incidentally noted.  IMPRESSION: No acute infarct.  Mild small vessel disease type changes.  Mild global atrophy.  Preliminary report at the time of imaging by Dr. Benard Rink.  Original Report Authenticated By: Fuller Canada, M.D.   Medications: Scheduled Meds:    . aspirin  81 mg Oral Daily  . diltiazem  120 mg Oral Daily  . heparin  5,000 Units Subcutaneous Q8H  . levofloxacin  250 mg Oral Daily  . levothyroxine  112 mcg Oral QAC breakfast  . lisinopril  40 mg Oral Daily  . pantoprazole  40 mg Oral BID  . polyethylene glycol  17 g Oral Daily  . QUEtiapine  100 mg Oral QHS  . sucralfate  1 g Oral TID WC & HS  . DISCONTD: cefTRIAXone (ROCEPHIN) IV  1 g Intravenous Q24H   Continuous Infusions:    . DISCONTD: sodium chloride 1,000 mL (09/28/11 0607)  . DISCONTD: sodium chloride     PRN Meds:.acetaminophen, bisacodyl, HYDROcodone-acetaminophen, HYDROmorphone, ondansetron (ZOFRAN) IV, ondansetron, DISCONTD: nitroGLYCERIN  Assessment/Plan:  Hyponatremia: Patient's hyponatremia is waxing and waning. Her hyponatremia was felt to be secondary to dehydration, but review of old records raises the question of psychogenic polydipsia. Her urinary sodium was less than 10. We will continue to follow her I.'s and O.'s as well as her sodium throughout her hospital  stay. With discontinuation of her IV fluid her sodium level has actually decreased somewhat. We will recheck her sodium in the morning.  Acute cystitis: Unfortunately it appears that her urine culture has not been accomplished. We will continue empiric antibiotic but transition to an oral therapy.  Hypertension: We will continue to follow her blood pressure trend without changing her medicines today.  Pelvic fracture: Will continue physical therapy while patient is in the hospital. She will likely need home health for ongoing physical therapy and occupational therapy.  Dysphagia: GI has evaluated patient and completed an EGD.  Please see the procedure note for the details of the EGD. At this time GI feels an upper GI series is indicated.  This has been ordered and is currently pending.  Disposition: The patient is stabilizing nicely. Her ultimate discharge disposition will depend on her sodium level stability and the results of her upper GI.       LOS: 4 days   Shad Ledvina T 09/29/2011, 11:24 AM

## 2011-09-30 ENCOUNTER — Other Ambulatory Visit (HOSPITAL_COMMUNITY): Payer: Medicare Other

## 2011-09-30 ENCOUNTER — Inpatient Hospital Stay (HOSPITAL_COMMUNITY): Payer: Medicare Other

## 2011-09-30 DIAGNOSIS — K449 Diaphragmatic hernia without obstruction or gangrene: Secondary | ICD-10-CM

## 2011-09-30 DIAGNOSIS — R1319 Other dysphagia: Secondary | ICD-10-CM

## 2011-09-30 DIAGNOSIS — K209 Esophagitis, unspecified: Secondary | ICD-10-CM

## 2011-09-30 LAB — BASIC METABOLIC PANEL
BUN: 6 mg/dL (ref 6–23)
CO2: 24 mEq/L (ref 19–32)
GFR calc non Af Amer: 78 mL/min — ABNORMAL LOW (ref >90)
Glucose, Bld: 92 mg/dL (ref 70–99)
Potassium: 3.8 mEq/L (ref 3.5–5.1)
Sodium: 130 mEq/L — ABNORMAL LOW (ref 135–145)

## 2011-09-30 NOTE — Progress Notes (Signed)
Subjective: Pt feels well. Denies any abdominal pain, nausea. Vomiting.  Continued dysphagia.  No regurgitation.  Scheduled for UGI today.  Objective: Vital signs in last 24 hours: Temp:  [97.4 F (36.3 C)-98.2 F (36.8 C)] 97.4 F (36.3 C) (10/22 0600) Pulse Rate:  [75-80] 80  (10/22 0600) Resp:  [17-20] 20  (10/22 0600) BP: (93-148)/(55-69) 127/63 mmHg (10/22 0600) SpO2:  [96 %-97 %] 97 % (10/22 0600) Weight:  [106 lb (48.081 kg)] 106 lb (48.081 kg) (10/22 0600) Last BM Date: 09/29/11 General:   Alert,  Well-developed, well-nourished, pleasant and cooperative in NAD. Head:  Normocephalic and atraumatic. Eyes:  Sclera clear, no icterus.   Conjunctiva pink. Mouth:  No deformity or lesions, OP pink/moist. Neck:  Supple; no masses or thyromegaly. Heart:  Regular rate and rhythm; no murmurs, clicks, rubs,  or gallops. Abdomen:  Soft, nontender and nondistended. No masses, hepatosplenomegaly or hernias noted. Normal bowel sounds, without guarding, and without rebound.   Msk:  Symmetrical without gross deformities. Normal posture. Extremities:  Without edema. Neurologic:  Alert and  oriented x4;  grossly normal neurologically. Skin:  Intact without significant lesions or rashes. Cervical Nodes:  No significant cervical adenopathy. Psych:  Alert and cooperative. Normal mood and affect.  Intake/Output from previous day: 10/21 0701 - 10/22 0700 In: 240 [P.O.:240] Out: -     Lab Results:  Basename 09/28/11 0519  WBC 3.9*  HGB 10.7*  HCT 29.9*  PLT 260   BMET  Basename 09/30/11 0626 09/29/11 0326 09/28/11 0519  NA 130* 127* 133*  K 3.8 3.5 3.7  CL 99 98 103  CO2 24 21 20   GLUCOSE 92 92 95  BUN 6 6 7   CREATININE 0.77 0.66 0.58  CALCIUM 8.5 8.4 8.7   Assessment: GERD:  Improved Dysphagia: Large diaphragmatic hernia, ?paraesophageal hernia  Plan: FU UGI series Continue Protonix 40 mg BID FU biopsy  LOS: 5 days   Lorenza Burton  09/30/2011, 8:10 AM   Reviewed  upper GI series with radiologist. This was a limited study because of patient's inability to assume the standard positions. It appears she has no paraesophageal  hernia. Radiologist does not feel she has a hiatal hernia. However I feel confident a large hiatal hernia was, indeed, seen at EGD. She likely has a motility disorder. However, no obstruction seen on the upper GI series.  She is doing much better. I recommend home on twice a day PPI therapy. Complete a week of Carafate. Avoid oral bisphosphonate therapy. Emphasize swallowing precautions as previously recommended. I will follow up on pathology.  From a GI standpoint, she is okay for discharge within the next 24 hours.

## 2011-09-30 NOTE — Progress Notes (Signed)
Notified Dr. Jena Gauss that the patient was back from her Upper GI test and wants to eat new orders given and followed.

## 2011-09-30 NOTE — Progress Notes (Signed)
Physical Therapy Treatment Patient Details Name: STEPHENIE NAVEJAS MRN: [id number] DOB: 04-29-1929 Today's Date: 09/30/2011  TIME: 905-927/ 1 TE 1 GT  PT Assessment/Plan  PT - Assessment/Plan Comments on Treatment Session: Pt doing much better with ambulation since last seen, able to ambulate 40' with standing rest break at 51' RW;Min A due to weakness of RLE and tendency for RLE to slightly buckle at times;Bed mobility MI and transfer to bed<>recliner with RW was supervision  Follow Up Recommendations: 24 hour supervision/assistance;Home health PT PT Goals  Acute Rehab PT Goals PT Goal: Supine/Side to Sit - Progress: Met PT Goal: Sit to Supine/Side - Progress: Met PT Transfer Goal: Sit to Stand/Stand to Sit - Progress: Progressing toward goal PT Transfer Goal: Bed to Chair/Chair to Bed - Progress: Progressing toward goal PT Goal: Ambulate - Progress: Met  PT Treatment Precautions/Restrictions  Precautions Precautions: Fall Required Braces or Orthoses: No Restrictions Weight Bearing Restrictions: No Mobility (including Balance) Bed Mobility Supine to Sit: 6: Modified independent (Device/Increase time) Supine to Sit Details (indicate cue type and reason): HOB elevated 30 degrees Sitting - Scoot to Edge of Bed: 6: Modified independent (Device/Increase time) Transfers Transfers: Yes Sit to Stand: 4: Min assist Sit to Stand Details (indicate cue type and reason): VCs for hand placement and to steady upon standing due to weak RLE Stand to Sit: 5: Supervision Stand to Sit Details: VCs to reach back for surface Ambulation/Gait Ambulation/Gait: Yes Ambulation/Gait Assistance: 4: Min assist Ambulation/Gait Assistance Details (indicate cue type and reason): RW; assistance with balance due to weak RLE Ambulation Distance (Feet): 40 Feet Assistive device: Rolling walker Gait Pattern: Step-to pattern;Decreased dorsiflexion - right;Decreased dorsiflexion - left;Decreased hip/knee flexion -  right;Decreased step length - left;Decreased stance time - right Gait velocity: slow Stairs: No Wheelchair Mobility Wheelchair Mobility: No    Exercise  General Exercises - Lower Extremity Long Arc Quad: Both;10 reps Hip ABduction/ADduction: Seated;Both;10 reps Hip Flexion/Marching: Seated (20 reps) Toe Raises: Both;20 reps Heel Raises: Both (20 reps) Other Exercises Other Exercises: standing lateral weight shifts x  End of Session PT - End of Session Equipment Utilized During Treatment: Gait belt (RW) Activity Tolerance: Patient tolerated treatment well Patient left: in chair;with call bell in reach General Behavior During Session: [facility] for tasks performed Cognition: [facility] for tasks performed  Kharter Brew ATKINSO 09/30/2011, 9:41 AM

## 2011-09-30 NOTE — Progress Notes (Signed)
Subjective: Looks great.  Wishes to go home, ambulated with PT/OT well according to her/  Denies cp/n/v/sob/bv/dv/coough or cold  Objective: Weight change: 0.499 kg (1 lb 1.6 oz) No intake or output data in the 24 hours ending 09/30/11 1334  Filed Vitals:   09/29/11 0600 09/29/11 1415 09/29/11 2200 09/30/11 0600  BP: 128/69 93/55 148/69 127/63  Pulse: 88 76 75 80  Temp: 97.4 F (36.3 C) 98 F (36.7 C) 98.2 F (36.8 C) 97.4 F (36.3 C)  TempSrc: Oral Oral Oral Oral  Resp: 16 17 20 20   Height:      Weight: 47.582 kg (104 lb 14.4 oz)   48.081 kg (106 lb)  SpO2: 98% 97% 96% 97%    HEENT frail, pleasant CF.  No pallor/ icterus/no lymphad CHEST cta b, no tvr, no tvf, no added sound CARDS non tele.  s1 s2 no m/r/g.  Cannot app any bruit, no jvd ABD soft, nt.nd, bs i NEURO grossly wnl SKIN wnl  Lab Results: @labtest @  Micro Results: No results found for this or any previous visit (from the past 240 hour(s)).  Studies/Results: Marland Kitchen   Medications: Scheduled Meds:   . aspirin  81 mg Oral Daily  . diltiazem  120 mg Oral Daily  . heparin  5,000 Units Subcutaneous Q8H  . levofloxacin  250 mg Oral Daily  . levothyroxine  112 mcg Oral QAC breakfast  . lisinopril  40 mg Oral Daily  . pantoprazole  40 mg Oral BID  . polyethylene glycol  17 g Oral Daily  . QUEtiapine  100 mg Oral QHS  . sucralfate  1 g Oral TID WC & HS   Continuous Infusions:  PRN Meds:.acetaminophen, bisacodyl, HYDROcodone-acetaminophen, HYDROmorphone, ondansetron (ZOFRAN) IV, ondansetron  Assessment/Plan: Patient Active Hospital Problem List:  Hypertension (09/20/2011)   Assessment: was slightly hypotensive last pm-seems to have resolved--Dcont dilt 120 ED, Lisinopril 40  Pelvic fracture (09/21/2011)   Assessment: Will continue physical therapy while patient is in the hospital. She will likely need home health for ongoing physical therapy and occupational therapy.  Dysphagia (09/21/2011)   Assessment:  GI has evaluated patient and completed an EGD. Please see the procedure note for the details of the EGD. At this time GI feels an upper GI series is indicated. This has been ordered and is currently pending. Await formal s/o from Dr. Harvin Hazel likely d/c home in am    SIADH (syndrome of inappropriate ADH production) (09/21/2011)   Assessment: see below Weakness generalized (09/27/2011)   Assessment: PT/Ot and HH rm at home  Hyponatremia (09/27/2011)   Assessment: sodium 127-->133.  U sod was less than 10.  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 We will continue empiric antibiotic but transition to an oral therapy--if no WBC-would d/c and see cbc in am   LOS: 5 days   [facility] 09/30/2011, 1:34 PM

## 2011-10-01 LAB — BASIC METABOLIC PANEL
BUN: 7 mg/dL (ref 6–23)
CO2: 21 mEq/L (ref 19–32)
Chloride: 100 mEq/L (ref 96–112)
Creatinine, Ser: 0.72 mg/dL (ref 0.50–1.10)
GFR calc Af Amer: >90 mL/min (ref >90)
Glucose, Bld: 83 mg/dL (ref 70–99)
Potassium: 3.4 mEq/L — ABNORMAL LOW (ref 3.5–5.1)

## 2011-10-01 MED ORDER — BISACODYL 10 MG RE SUPP: 10.0000 mg | Freq: Every day | RECTAL | Status: AC | PRN

## 2011-10-01 MED ORDER — PANTOPRAZOLE SODIUM 40 MG PO TBEC: 40.0000 mg | DELAYED_RELEASE_TABLET | Freq: Two times a day (BID) | ORAL | Status: DC

## 2011-10-01 MED ORDER — SUCRALFATE 1 GM/10ML PO SUSP: 1.0000 g | Freq: Three times a day (TID) | ORAL | Status: AC

## 2011-10-01 NOTE — Discharge Summary (Signed)
Physician Discharge Summary  Patient ID: Felicia Harvey MRN: [id number] DOB/AGE: [birth year] 75 y.o.  Admit date: 09/25/2011 Discharge date: 10/01/2011  HPI-Patient is a 77 year old white female with past medical history significant for hypertension hypothyroidism GERD dysphagia anxiety and osteopenia. Patient was discharged from the hospital 3 days ago secondary to a pelvic fracture. Patient was not a surgical candidate. Patient was treated with physical therapy and discharged home with physical therapy. Patient stated that she was feeling well until about noon today. She started feeling weak with pain on her right side. The pain is located in the shoulder and hip. She thinks this is the side she fell on a few days ago when she sustained a fracture. She also complains of numbness in the right side of her face. She does not have any speech deficit, no vision changes, no focal weakness or balance problems. She does complain of problems swallowing solid foods. She stated that she had this problem in the past and have to have esophageal dilation.  She also complains of being constipated. She has not had a bowel movement for the past 3 days. Patient's daughter also stated that during physical therapy 2 days ago she was noted to have a low blood pressure systolic in the 80s. Patient is taking antihypertensive medication. In the emergency room patient was noted to be hyponatremic so we were asked to admit patient for further management.   Discharge Diagnoses:  Active Problems:  Hypertension  Pelvic fracture  Dysphagia  SIADH (syndrome of inappropriate ADH production)  Weakness generalized  Hyponatremia  Psychogenic polydipsia  Dehydration   Discharged Condition: good  Hospital Course:  Hypertension (09/20/2011) Assessment: mildly hypotensive in hospital-but rechecking with a manaul cuff proved that there was some discrepancy with the dynamap and manaul cuff-meds may need to be adjusted as an  outpatientcontinue home meds dilt 120 ED, Lisinopril 40   Pelvic fracture (09/21/2011) Assessment:  She will likely need home health for ongoing physical therapy and occupational therapy. She is s/p recent hip repair and will need extensive therapy  Dysphagia (09/21/2011) Assessment: GI has evaluated patient and completed an EGD, which showed inflammatory changes midesophagus most likely representing pill-induced injury. There was also a question of of a large diaphragmatic hernia, query paraesophageal component Abnormal duodenal mucosa status post biopsy.  . Please see the procedure note for tEGD report done below tails of the EGD.  Weakness generalized (09/27/2011) Assessment: PT/Ot and HH rm at home   Hyponatremia (09/27/2011) Assessment: Patient's hyponatremia is waxing and waning. Her hyponatremia was felt to be secondary to dehydration, but review of old records raises the question of psychogenic polydipsia. Her urinary sodium was less than 10.sodium trended up during course of hospitalization to 131 and I believe that she has some amount of psychogenic polydipsia and will need to likely be restricted on her fluid intake    Dehydration (09/27/2011) Assessment: stable Acute cystitis:  Unfortunately it appears that her urine culture has not been accomplished. She was kept on empiric antibiotics which wer eprompylt discontinued, as she has no evidences of any white count or any fever or any signs of systemic infection.   Consults: GI  Significant Diagnostic Studies: Dg Ugi W/high Density W/kub  09/30/2011  *RADIOLOGY REPORT*  Clinical Data:  Evaluate for hiatal and paraesophageal hernia, abnormal EGD  UPPER GI SERIES WITH KUB  Technique:  Routine upper GI series was performed with thin and high density barium.  Fluoroscopy Time: 4.2 minutes  Comparison:  None.  Findings: Scout radiographs demonstrate prior upper lumbar vertebral augmentation and a left hip arthroplasty.  Esophageal  dysmotility with nonperistaltic/tertiary contractions involving the mid to distal esophagus.  Distal esophagus is narrowed and notable for mucosal irregularity on multiple images, but does intermittently distend appropriately, suggesting esophagitis.  No fixed narrowing or esophageal stricture.  A 13 mm barium pill successfully passed into the stomach.  No hiatal or paraesophageal hernia.  Normal position of the pylorus/proximal duodenum in the right upper abdomen.  IMPRESSION: Esophageal dysmotility.  Distal esophagitis, without fixed narrowing or stricture.  No hiatal or paraesophageal hernia.  Original Report Authenticated By: Charline Bills, M.D.   Mr Brain Wo Contrast  09/26/2011  *RADIOLOGY REPORT*  Clinical Data: Fall 5 days ago.  Pelvic fracture.  Onset of numbness right facial region and extending down right body with weakness.  MRI HEAD WITHOUT CONTRAST  Technique:  Multiplanar, multiecho pulse sequences of the brain and surrounding structures were obtained according to standard protocol without intravenous contrast.  Comparison: 09/25/2011 CT.  No comparison MR.  Findings: No acute infarct.  No intracranial hemorrhage.  No intracranial mass lesion detected on this unenhanced exam.  Mild small vessel disease type changes.  Mild global atrophy without hydrocephalus.  Major intracranial vascular structures are patent.  Minimal ethmoid sinus air cell mucosal thickening.  Shallow sella with small pituitary gland incidentally noted.  IMPRESSION: No acute infarct.  Mild small vessel disease type changes.  Mild global atrophy.  Preliminary report at the time of imaging by Dr. Benard Rink.  Original Report Authenticated By: Fuller Canada, M.D.        Treatments: IV hydration and cardiac meds: diltiazem  Discharge Exam: Blood pressure 130/74, pulse 80, temperature 98 F (36.7 C), temperature source Oral, resp. rate 20, height 5' (1.524 m), weight 46.5 kg (102 lb 8.2 oz), SpO2 97.00%. General  appearance: alert, cooperative and appears stated age Head: Normocephalic, without obvious abnormality, atraumatic Ears: normal TM's and external ear canals both ears Nose: Nares normal. Septum midline. Mucosa normal. No drainage or sinus tenderness. Throat: lips, mucosa, and tongue normal; teeth and gums normal Back: symmetric, no curvature. ROM normal. No CVA tenderness. Resp: clear to auscultation bilaterally Chest wall: no tenderness GI: soft, non-tender; bowel sounds normal; no masses,  no organomegaly Extremities: extremities normal, atraumatic, no cyanosis or edema Pulses: 2+ and symmetric  Disposition: Home-Health Care Svc  Discharge Orders    Future Appointments: Provider: Department: Dept Phone: Center:   11/19/2011 3:00 PM Fuller Canada, MD [facility] [phone] [facility]     Future Orders Please Complete By Expires   Diet - low sodium heart healthy      Discharge instructions      Comments:   Follow up with your Primary Pshycician and Dr. Kendell Bane of GI prn   Call MD for:  temperature >100.4      Call MD for:  severe uncontrolled pain      Call MD for:  persistant dizziness or light-headedness      Call MD for:  extreme fatigue      Call MD for:  difficulty breathing, headache or visual disturbances      Walk with assistance        Current Discharge Medication List    START taking these medications   Details  bisacodyl (DULCOLAX) 10 MG suppository Place 1 suppository (10 mg total) rectally daily as needed for constipation. Qty: 12 suppository, Refills: 0    pantoprazole (PROTONIX) 40 MG tablet Take  1 tablet (40 mg total) by mouth 2 (two) times daily. Qty: 30 tablet, Refills: 0   Associated Diagnoses: Pain; Hypertension; Hypothyroidism; Depression; Headache; Anemia; Fall; Pelvic fracture; Emphysema; Dysphagia; Chest pain; SIADH (syndrome of inappropriate ADH production); Hematoma; GERD (gastroesophageal reflux disease)    sucralfate (CARAFATE) 1 GM/10ML  suspension Take 10 mLs (1 g total) by mouth 4 (four) times daily -  with meals and at bedtime. Qty: 420 mL, Refills: 0   Associated Diagnoses: Pain; Hypertension; Hypothyroidism; Depression; Headache; Anemia; Fall; Pelvic fracture; Emphysema; Dysphagia; Chest pain; SIADH (syndrome of inappropriate ADH production); Hematoma; GERD (gastroesophageal reflux disease)      CONTINUE these medications which have NOT CHANGED   Details  alendronate (FOSAMAX) 70 MG tablet Take 70 mg by mouth every 7 (seven) days. On Fridays of each weekTake with a full glass of water on an empty stomach.     aspirin 81 MG tablet Take 81 mg by mouth daily.      diltiazem (CARDIZEM CD) 120 MG 24 hr capsule Take 120 mg by mouth at bedtime.      HYDROcodone-acetaminophen (NORCO) 5-325 MG per tablet Take 0.5-2 tablets by mouth every 4 (four) hours as needed for pain. Qty: 30 tablet, Refills: 0   Associated Diagnoses: Pain    levothyroxine (SYNTHROID, LEVOTHROID) 112 MCG tablet Take 112 mcg by mouth daily.      lisinopril (PRINIVIL,ZESTRIL) 40 MG tablet Take 40 mg by mouth daily.      QUEtiapine (SEROQUEL) 100 MG tablet Take 100 mg by mouth at bedtime.      acetaminophen (TYLENOL) 325 MG tablet Take 650 mg by mouth every 6 (six) hours as needed. For pain    Calcium Carbonate Antacid (TUMS PO) Take 1 tablet by mouth daily as needed. For indigestion    Multiple Vitamins-Minerals (ONE-A-DAY VITACRAVES PO) Take 1 tablet by mouth daily.       STOP taking these medications     DIAZEPAM PO      omeprazole (PRILOSEC) 20 MG capsule        Follow-up Information    Please follow up. ([facility] [phone])          Signed: Pleas Koch 10/01/2011, 10:06 AM

## 2011-10-01 NOTE — Progress Notes (Signed)
D/c instructions reviewed with patient.  Verbalized understanding. Pt dc'd to home with son.   Schonewitz, Candelaria Stagers 10/01/2011

## 2011-10-02 ENCOUNTER — Encounter: Payer: Self-pay | Admitting: Urgent Care

## 2011-10-02 ENCOUNTER — Telehealth: Payer: Self-pay | Admitting: Urgent Care

## 2011-10-02 NOTE — Telephone Encounter (Signed)
Mailed appt card to pt for OV on 12/5 at 12 pm with KJ

## 2011-10-02 NOTE — Telephone Encounter (Signed)
Patient needs OV 6 weeks re: followup hospitalization, GERD, dysphagia

## 2011-10-04 ENCOUNTER — Encounter (HOSPITAL_COMMUNITY): Payer: Self-pay | Admitting: Internal Medicine

## 2011-10-08 ENCOUNTER — Other Ambulatory Visit (HOSPITAL_COMMUNITY): Payer: Self-pay | Admitting: Internal Medicine

## 2011-10-08 ENCOUNTER — Ambulatory Visit (HOSPITAL_COMMUNITY)
Admission: RE | Admit: 2011-10-08 | Discharge: 2011-10-08 | Disposition: A | Discharge status: Home / Self Care | Payer: Medicare Other | Source: Ambulatory Visit | Attending: Internal Medicine | Admitting: Internal Medicine

## 2011-10-08 DIAGNOSIS — R52 Pain, unspecified: Secondary | ICD-10-CM

## 2011-10-08 DIAGNOSIS — M25559 Pain in unspecified hip: Secondary | ICD-10-CM | POA: Insufficient documentation

## 2011-11-13 ENCOUNTER — Ambulatory Visit (INDEPENDENT_AMBULATORY_CARE_PROVIDER_SITE_OTHER): Payer: Medicare Other | Admitting: Urgent Care

## 2011-11-13 ENCOUNTER — Encounter: Payer: Self-pay | Admitting: Urgent Care

## 2011-11-13 DIAGNOSIS — K219 Gastro-esophageal reflux disease without esophagitis: Secondary | ICD-10-CM

## 2011-11-13 DIAGNOSIS — K900 Celiac disease: Secondary | ICD-10-CM

## 2011-11-13 DIAGNOSIS — R1310 Dysphagia, unspecified: Secondary | ICD-10-CM

## 2011-11-13 NOTE — Patient Instructions (Signed)
Begin omeprazole 20 mg daily before breakfast Continue a gluten-free diet Office visit in 6 months, but call sooner if you have any problems

## 2011-11-13 NOTE — Assessment & Plan Note (Signed)
Improved. Symptoms may be secondary to large hiatal hernia. Doing well at this point with chewing her food thoroughly.

## 2011-11-13 NOTE — Progress Notes (Signed)
Primary Care Physician:  Dwana Melena, MD Primary Gastroenterologist:  Dr. Jena Gauss  Chief Complaint  Patient presents with  . Follow-up    dysphagia/large HH  . Follow-up    celiac disease    HPI:  Felicia Harvey is a 77 y.o. female here for follow up for dysphagia, large hiatal hernia, and celiac disease. She was hospitalized October 2012 secondary to hyponatremia and pelvic fracture. She has history of chronic GERD and dysphagia, previously on omeprazole. She had been switched to Nexium, however she admits to not taking nexium 40mg  daily due to the cost. She tells me she is trying to pack off her medical bills.  She rarely eats red meat, likes fish, not much other meat.  Lots veggies.  Has been on a gluten free diet for years. Denies dysphagia or odynophagia currently.  She does have occ heartburn.    Past Medical History  Diagnosis Date  . Thyroid disease   . HTN (hypertension)   . Reflux   . Anxiety   . Osteopenia   . GERD (gastroesophageal reflux disease)   . Hypothyroidism   . Dysphagia 09/21/2011  . Chest pain 09/21/2011  . Schizophrenia   . Polydipsia 09/21/2011  . 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 Celiac disease     Past Surgical History  Procedure Date  . Left shoulder   . Thyroidectomy   . Vertebroplasty   . Left hip replacement   . Esophagogastroduodenoscopy 09/27/2011    Inflammatory changes midesophagus most likely pill-induced injury (Fosamax), large diaphragmatic hernia, biopsies from small bowel consistent with celiac disease    Current Outpatient Prescriptions  Medication Sig Dispense Refill  . acetaminophen (TYLENOL) 325 MG tablet Take 650 mg by mouth every 6 (six) hours as needed. For pain      . alendronate (FOSAMAX) 70 MG tablet Take 70 mg by mouth every 7 (seven) days. On Fridays of each weekTake with a full glass of water on an empty stomach.       . Calcium Carbonate Antacid (TUMS PO) Take 1 tablet by mouth daily as needed. For indigestion      .  diltiazem (CARDIZEM CD) 120 MG 24 hr capsule Take 120 mg by mouth at bedtime.        Marland Kitchen HYDROcodone-acetaminophen (NORCO) 5-325 MG per tablet Take 0.5-2 tablets by mouth every 4 (four) hours as needed for pain.  30 tablet  0  . levothyroxine (SYNTHROID, LEVOTHROID) 112 MCG tablet Take 112 mcg by mouth daily.        Marland Kitchen lisinopril (PRINIVIL,ZESTRIL) 40 MG tablet Take 40 mg by mouth daily.        . Multiple Vitamins-Minerals (ONE-A-DAY VITACRAVES PO) Take 1 tablet by mouth daily.       . pantoprazole (PROTONIX) 40 MG tablet Take 1 tablet (40 mg total) by mouth 2 (two) times daily.  30 tablet  0  . QUEtiapine (SEROQUEL) 100 MG tablet Take 100 mg by mouth at bedtime.        Marland Kitchen NEXIUM 40 MG capsule         Allergies as of 11/13/2011 - Review Complete 11/13/2011  Allergen Reaction Noted  . Gluten  09/20/2011    Review of Systems: Gen: Denies any fever, chills, sweats, anorexia, fatigue, weakness, malaise, weight loss, and sleep disorder CV: Denies chest pain, angina, palpitations, syncope, orthopnea, PND, peripheral edema, and claudication. Resp: Denies dyspnea at rest, dyspnea with exercise, cough, sputum, wheezing, coughing up blood, and pleurisy. GI: Denies vomiting blood, jaundice,  and fecal incontinence.   Denies dysphagia or odynophagia. Derm: Denies rash, itching, dry skin, hives, moles, warts, or unhealing ulcers.  Psych: Denies depression, anxiety, memory loss, suicidal ideation, hallucinations, paranoia, and confusion. Heme: Denies bruising, bleeding, and enlarged lymph nodes.  Physical Exam: BP 110/60  Pulse 63  Temp(Src) 97.6 F (36.4 C) (Temporal)  Ht 5\' 2"  (1.575 m)  Wt 98 lb 6.4 oz (44.634 kg)  BMI 18.00 kg/m2 General:   Alert,  Well-developed, well-nourished, pleasant and cooperative in NAD. Accompanied by her son today. Head:  Normocephalic and atraumatic. Eyes:  Sclera clear, no icterus.   Conjunctiva pink. Mouth:  No deformity or lesions, oropharynx pink and moist. Neck:   Supple; no masses or thyromegaly. Heart:  Regular rate and rhythm; no murmurs, clicks, rubs,  or gallops. Abdomen:  Soft, nontender and nondistended. No masses, hepatosplenomegaly or hernias noted. Normal bowel sounds, without guarding, and without rebound.   Msk:  Symmetrical without gross deformities. Kyphosis. Pulses:  Normal pulses noted. Extremities:  Without clubbing or edema. Neurologic:  Alert and  oriented x4;  grossly normal neurologically. Skin:  Intact without significant lesions or rashes. Cervical Nodes:  No significant cervical adenopathy. Psych:  Alert and cooperative. Normal mood and affect.

## 2011-11-13 NOTE — Assessment & Plan Note (Signed)
Continue gluten free diet.  

## 2011-11-13 NOTE — Assessment & Plan Note (Signed)
Advised to resume daily omeprazole 20 mg 30 minutes before breakfast.  She has continued Fosamax for osteoporosis despite recommendations from Dr. Jena Gauss to discontinue. She will need to follow dysphagia precautions with this medicine in including drinking with a full glass of water and remain upright for several hours after taking it she continues on this medication.

## 2011-11-13 NOTE — Progress Notes (Signed)
Cc to PCP 

## 2011-11-15 NOTE — Progress Notes (Signed)
REVIEWED.  

## 2011-11-19 ENCOUNTER — Encounter: Payer: Self-pay | Admitting: Orthopedic Surgery

## 2011-11-19 ENCOUNTER — Ambulatory Visit (INDEPENDENT_AMBULATORY_CARE_PROVIDER_SITE_OTHER): Payer: Medicare Other | Admitting: Orthopedic Surgery

## 2011-11-19 VITALS — BP 110/66 | Ht 62.0 in | Wt 98.0 lb

## 2011-11-19 DIAGNOSIS — S329XXA Fracture of unspecified parts of lumbosacral spine and pelvis, initial encounter for closed fracture: Secondary | ICD-10-CM

## 2011-11-19 NOTE — Progress Notes (Signed)
New problem  This patient fractured her pelvis on September 20, 2011 The other orthopaedist in [place] her and I have agreed to take over her care per her request as I took care of her shoulder  She is ambulating now with a cane complains a little symptoms if any.  Neurovascular exam is intact flexion of the hips normal leg lengths are equal no pain with rotatory movement of the hip  Currently on hydrocodone for pain when she does have some pain is 6/10.  System review for T., heartburn, constipation, frequency, urgency, dizziness, depression, easy bruising, adverse reaction to glue was  Medical surgical history as recorded   Separate x-ray report for fractured pelvis   Repeat pelvic x-ray The previously noted fracture is nonvisible on this x-ray  Impression presumed healed pelvic fracture  Activities as tolerated follow up as needed

## 2011-11-19 NOTE — Patient Instructions (Signed)
Activities as tolerated. 

## 2012-02-03 DIAGNOSIS — R634 Abnormal weight loss: Secondary | ICD-10-CM | POA: Diagnosis not present

## 2012-02-03 DIAGNOSIS — E039 Hypothyroidism, unspecified: Secondary | ICD-10-CM | POA: Diagnosis not present

## 2012-02-03 DIAGNOSIS — K219 Gastro-esophageal reflux disease without esophagitis: Secondary | ICD-10-CM | POA: Diagnosis not present

## 2012-02-14 ENCOUNTER — Encounter (HOSPITAL_COMMUNITY): Payer: Self-pay

## 2012-02-14 ENCOUNTER — Emergency Department (HOSPITAL_COMMUNITY)
Admission: EM | Admit: 2012-02-14 | Discharge: 2012-02-14 | Disposition: A | Discharge status: Home / Self Care | Payer: Medicare Other | Attending: Emergency Medicine | Admitting: Emergency Medicine

## 2012-02-14 DIAGNOSIS — E039 Hypothyroidism, unspecified: Secondary | ICD-10-CM | POA: Diagnosis not present

## 2012-02-14 DIAGNOSIS — F209 Schizophrenia, unspecified: Secondary | ICD-10-CM | POA: Insufficient documentation

## 2012-02-14 DIAGNOSIS — K219 Gastro-esophageal reflux disease without esophagitis: Secondary | ICD-10-CM | POA: Diagnosis not present

## 2012-02-14 DIAGNOSIS — Y93E8 Activity, other personal hygiene: Secondary | ICD-10-CM | POA: Insufficient documentation

## 2012-02-14 DIAGNOSIS — F411 Generalized anxiety disorder: Secondary | ICD-10-CM | POA: Insufficient documentation

## 2012-02-14 DIAGNOSIS — S99929A Unspecified injury of unspecified foot, initial encounter: Secondary | ICD-10-CM | POA: Diagnosis not present

## 2012-02-14 DIAGNOSIS — S91109A Unspecified open wound of unspecified toe(s) without damage to nail, initial encounter: Secondary | ICD-10-CM | POA: Insufficient documentation

## 2012-02-14 DIAGNOSIS — S8990XA Unspecified injury of unspecified lower leg, initial encounter: Secondary | ICD-10-CM | POA: Diagnosis not present

## 2012-02-14 DIAGNOSIS — I10 Essential (primary) hypertension: Secondary | ICD-10-CM | POA: Insufficient documentation

## 2012-02-14 DIAGNOSIS — W278XXA Contact with other nonpowered hand tool, initial encounter: Secondary | ICD-10-CM | POA: Insufficient documentation

## 2012-02-14 DIAGNOSIS — Y92009 Unspecified place in unspecified non-institutional (private) residence as the place of occurrence of the external cause: Secondary | ICD-10-CM | POA: Insufficient documentation

## 2012-02-14 NOTE — ED Provider Notes (Signed)
This chart was scribed for Cheri Guppy, MD by Williemae Natter. The patient was seen in room [facility] at 11:29 PM.  CSN: [id number]  Arrival date & time 02/14/12  1851   First MD Initiated Contact with Patient 02/14/12 2308      Chief Complaint  Patient presents with  . Coagulation Disorder  . Toe Injury    (Consider location/radiation/quality/duration/timing/severity/associated sxs/prior treatment) HPI Felicia Harvey is a 78 y.o. female who presents to the Emergency Department complaining of toe pain. Pt stubbed her right great toe 3 weeks ago. Pt reports the toe bleeding all day and had it wrapped up. Bleeding has now stopped.  Past Medical History  Diagnosis Date  . Thyroid disease   . HTN (hypertension)   . Reflux   . Anxiety   . Osteopenia   . GERD (gastroesophageal reflux disease)   . Hypothyroidism   . Dysphagia 09/21/2011  . Chest pain 09/21/2011  . Schizophrenia   . Polydipsia 09/21/2011  . 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 Celiac disease     Past Surgical History  Procedure Date  . Left shoulder   . Thyroidectomy   . Vertebroplasty   . Left hip replacement   . Esophagogastroduodenoscopy 09/27/2011    Inflammatory changes midesophagus most likely pill-induced injury (Fosamax), large diaphragmatic hernia, biopsies from small bowel consistent with celiac disease    Family History  Problem Relation Age of Onset  . Cancer    . Kidney disease      History  Substance Use Topics  . Smoking status: Never Smoker   . Smokeless tobacco: Not on file  . Alcohol Use: Yes     rarely    OB History    Grav Para Term Preterm Abortions TAB SAB Ect Mult Living                  Review of Systems 10 Systems reviewed and are negative for acute change except as noted in the HPI.  Allergies  Gluten  Home Medications   Current Outpatient Rx  Name Route Sig Dispense Refill  . ACETAMINOPHEN 325 MG PO TABS Oral Take 650 mg by mouth every 6 (six) hours as  needed. For pain    . ALENDRONATE SODIUM 70 MG PO TABS Oral Take 70 mg by mouth every 7 (seven) days. On Fridays of each weekTake with a full glass of water on an empty stomach.     Marland Kitchen DILTIAZEM HCL ER COATED BEADS 120 MG PO CP24 Oral Take 120 mg by mouth at bedtime.      Marland Kitchen LEVOTHYROXINE SODIUM 50 MCG PO TABS Oral Take 50 mcg by mouth every morning.    Marland Kitchen LISINOPRIL 40 MG PO TABS Oral Take 40 mg by mouth every morning.     Marland Kitchen PANTOPRAZOLE SODIUM 40 MG PO TBEC Oral Take 40 mg by mouth.    . QUETIAPINE FUMARATE 100 MG PO TABS Oral Take 100 mg by mouth at bedtime.      Marland Kitchen HYDROCODONE-ACETAMINOPHEN 5-325 MG PO TABS Oral Take 0.5-2 tablets by mouth every 4 (four) hours as needed for pain. 30 tablet 0    BP 187/89  Pulse 68  Temp(Src) 97.5 F (36.4 C) (Oral)  Resp 20  Ht 5\' 3"  (1.6 m)  Wt 98 lb (44.453 kg)  BMI 17.36 kg/m2  SpO2 99%  Physical Exam  Nursing note and vitals reviewed. Constitutional: She is oriented to person, place, and time. She appears well-developed and well-nourished.  HENT:  Head: Normocephalic and atraumatic.  Neck: Normal range of motion. Neck supple.  Cardiovascular: Normal rate.   Pulmonary/Chest: Effort normal. No respiratory distress.  Musculoskeletal: Normal range of motion. She exhibits no edema.  Neurological: She is alert and oriented to person, place, and time.  Skin: Skin is warm and dry.  Psychiatric: She has a normal mood and affect. Her behavior is normal.   Right gt toe - dried blood under nail. No active bleeding.   ED Course  Procedures (including critical care time) No tests indicated. Dressing reapplied in ed Right great toe was bleeding is stopped now.  There is no indication for any other testing.  A bandage was reapplied and the patient was released to DIAGNOSTIC STUDIES: Oxygen Saturation is 99% on room air, normal by my interpretation.    COORDINATION OF CARE:    Labs Reviewed - No data to display No results found.   No diagnosis  found.    MDM  Right gt toe bleeding- resolved I personally performed the services described in this documentation, which was scribed in my presence. The recorded information has been reviewed and considered.      Cheri Guppy, MD 02/15/12 [phone]

## 2012-02-14 NOTE — Discharge Instructions (Signed)
The bleeding has stopped.  Keep the dressing applied.  Follow up with your Dr. As needed.  Return for recurrent uncontrolled bleeding.

## 2012-02-14 NOTE — ED Notes (Signed)
Pt has oozing cut to right great toe. Pt was cutting toe nail and nicked her skin today. Marland Kitchen

## 2012-02-14 NOTE — ED Notes (Signed)
Pt reports hitting her rt great toe x3 weeks ago and causing a contusion to the digit. Pt states she has been caring for the toe and noted drainage from the toenail, pt has been pouring hydrogen peroxide on digit daily. Pt states tonight she was clipping the toenail and cut too short causing the toe to bleed.

## 2012-02-14 NOTE — ED Notes (Signed)
D/c instructions reviewed w/ pt and family - pt and family deny any further questions or concerns at present.\ 

## 2012-03-06 DIAGNOSIS — E039 Hypothyroidism, unspecified: Secondary | ICD-10-CM | POA: Diagnosis not present

## 2012-03-09 DIAGNOSIS — IMO0002 Reserved for concepts with insufficient information to code with codable children: Secondary | ICD-10-CM

## 2012-03-09 HISTORY — DX: Reserved for concepts with insufficient information to code with codable children: IMO0002

## 2012-04-01 ENCOUNTER — Inpatient Hospital Stay (HOSPITAL_COMMUNITY): Payer: Medicare Other

## 2012-04-01 ENCOUNTER — Encounter (HOSPITAL_COMMUNITY): Payer: Self-pay | Admitting: Emergency Medicine

## 2012-04-01 ENCOUNTER — Other Ambulatory Visit: Payer: Self-pay

## 2012-04-01 ENCOUNTER — Emergency Department (HOSPITAL_COMMUNITY): Payer: Medicare Other

## 2012-04-01 ENCOUNTER — Observation Stay (HOSPITAL_COMMUNITY)
Admission: EM | Admit: 2012-04-01 | Discharge: 2012-04-04 | Disposition: A | Discharge status: Home / Self Care | Payer: Medicare Other | Attending: Orthopedic Surgery | Admitting: Orthopedic Surgery

## 2012-04-01 DIAGNOSIS — Z79899 Other long term (current) drug therapy: Secondary | ICD-10-CM | POA: Diagnosis not present

## 2012-04-01 DIAGNOSIS — M25559 Pain in unspecified hip: Secondary | ICD-10-CM | POA: Diagnosis not present

## 2012-04-01 DIAGNOSIS — M25569 Pain in unspecified knee: Secondary | ICD-10-CM | POA: Diagnosis not present

## 2012-04-01 DIAGNOSIS — J984 Other disorders of lung: Secondary | ICD-10-CM | POA: Diagnosis not present

## 2012-04-01 DIAGNOSIS — S82899A Other fracture of unspecified lower leg, initial encounter for closed fracture: Secondary | ICD-10-CM

## 2012-04-01 DIAGNOSIS — S82009A Unspecified fracture of unspecified patella, initial encounter for closed fracture: Principal | ICD-10-CM | POA: Insufficient documentation

## 2012-04-01 DIAGNOSIS — W19XXXA Unspecified fall, initial encounter: Secondary | ICD-10-CM | POA: Insufficient documentation

## 2012-04-01 DIAGNOSIS — M25519 Pain in unspecified shoulder: Secondary | ICD-10-CM | POA: Diagnosis not present

## 2012-04-01 DIAGNOSIS — I10 Essential (primary) hypertension: Secondary | ICD-10-CM | POA: Insufficient documentation

## 2012-04-01 DIAGNOSIS — M25469 Effusion, unspecified knee: Secondary | ICD-10-CM | POA: Diagnosis not present

## 2012-04-01 DIAGNOSIS — S82002A Unspecified fracture of left patella, initial encounter for closed fracture: Secondary | ICD-10-CM

## 2012-04-01 DIAGNOSIS — R079 Chest pain, unspecified: Secondary | ICD-10-CM | POA: Diagnosis not present

## 2012-04-01 DIAGNOSIS — R52 Pain, unspecified: Secondary | ICD-10-CM | POA: Diagnosis not present

## 2012-04-01 DIAGNOSIS — IMO0002 Reserved for concepts with insufficient information to code with codable children: Secondary | ICD-10-CM

## 2012-04-01 LAB — CBC
HCT: 34.2 % — ABNORMAL LOW (ref 36.0–46.0)
Hemoglobin: 11.5 g/dL — ABNORMAL LOW (ref 12.0–15.0)
MCV: 88.1 fL (ref 78.0–100.0)
RDW: 13.1 % (ref 11.5–15.5)
WBC: 9.5 10*3/uL (ref 4.0–10.5)

## 2012-04-01 LAB — DIFFERENTIAL
Basophils Absolute: 0 10*3/uL (ref 0.0–0.1)
Basophils Relative: 0 % (ref 0–1)
Lymphs Abs: 1.3 10*3/uL (ref 0.7–4.0)
Monocytes Relative: 7 % (ref 3–12)
Neutro Abs: 7.5 10*3/uL (ref 1.7–7.7)

## 2012-04-01 LAB — BASIC METABOLIC PANEL
BUN: 9 mg/dL (ref 6–23)
CO2: 23 mEq/L (ref 19–32)
Chloride: 98 mEq/L (ref 96–112)
GFR calc non Af Amer: 82 mL/min — ABNORMAL LOW (ref >90)
Glucose, Bld: 126 mg/dL — ABNORMAL HIGH (ref 70–99)
Potassium: 3.6 mEq/L (ref 3.5–5.1)

## 2012-04-01 LAB — PROTIME-INR: INR: 1.11 (ref 0.00–1.49)

## 2012-04-01 MED ORDER — LISINOPRIL 10 MG PO TABS
40.0000 mg | ORAL_TABLET | Freq: Every day | ORAL | Status: DC
  Administered 2012-04-02 – 2012-04-04 (×3): 40 mg via ORAL
  Filled 2012-04-01 (×3): qty 4

## 2012-04-01 MED ORDER — DILTIAZEM HCL ER COATED BEADS 120 MG PO CP24
120.0000 mg | ORAL_CAPSULE | Freq: Every day | ORAL | Status: DC
  Administered 2012-04-02 – 2012-04-04 (×3): 120 mg via ORAL
  Filled 2012-04-01 (×3): qty 1

## 2012-04-01 MED ORDER — MORPHINE SULFATE 2 MG/ML IJ SOLN
1.0000 mg | INTRAMUSCULAR | Status: DC | PRN
  Administered 2012-04-01: 1 mg via INTRAVENOUS
  Filled 2012-04-01 (×2): qty 1

## 2012-04-01 MED ORDER — SODIUM CHLORIDE 0.9 % IJ SOLN
INTRAMUSCULAR | Status: AC
  Administered 2012-04-01: 18:00:00
  Filled 2012-04-01: qty 3

## 2012-04-01 MED ORDER — HYDROCODONE-ACETAMINOPHEN 5-325 MG PO TABS
1.0000 | ORAL_TABLET | ORAL | Status: DC | PRN
  Administered 2012-04-01 – 2012-04-02 (×3): 1 via ORAL
  Filled 2012-04-01 (×3): qty 1

## 2012-04-01 MED ORDER — HYDROCODONE-ACETAMINOPHEN 5-325 MG PO TABS
1.0000 | ORAL_TABLET | Freq: Once | ORAL | Status: AC
  Administered 2012-04-01: 1 via ORAL
  Filled 2012-04-01: qty 1

## 2012-04-01 MED ORDER — ALENDRONATE SODIUM 70 MG PO TABS: 70.0000 mg | ORAL_TABLET | ORAL | Status: DC

## 2012-04-01 MED ORDER — QUETIAPINE FUMARATE 100 MG PO TABS
100.0000 mg | ORAL_TABLET | Freq: Every day | ORAL | Status: DC
  Administered 2012-04-01 – 2012-04-03 (×3): 100 mg via ORAL
  Filled 2012-04-01 (×3): qty 1

## 2012-04-01 MED ORDER — LEVOTHYROXINE SODIUM 50 MCG PO TABS
50.0000 ug | ORAL_TABLET | Freq: Every day | ORAL | Status: DC
  Administered 2012-04-02 – 2012-04-04 (×3): 50 ug via ORAL
  Filled 2012-04-01 (×3): qty 1

## 2012-04-01 NOTE — ED Provider Notes (Signed)
History     CSN: [id number]  Arrival date & time 04/01/12  1035   First MD Initiated Contact with Patient 04/01/12 1101      Chief Complaint  Patient presents with  . Knee Pain  . Fall   HPI Felicia Harvey is a 78 y.o. female who presents to the ED after a fall. The patient arrived via EMS. She was walking and tripped over a rock. She fell forward and hit her right shoulder and  left knee and left hip. She rates the pain as a 8/10. The history was provided by the patient and her family.  Past Medical History  Diagnosis Date  . Thyroid disease   . HTN (hypertension)   . Reflux   . Anxiety   . Osteopenia   . GERD (gastroesophageal reflux disease)   . Hypothyroidism   . Dysphagia 09/21/2011  . Chest pain 09/21/2011  . Schizophrenia   . Polydipsia 09/21/2011  . 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 Celiac disease     Past Surgical History  Procedure Date  . Left shoulder   . Thyroidectomy   . Vertebroplasty   . Left hip replacement   . Esophagogastroduodenoscopy 09/27/2011    Inflammatory changes midesophagus most likely pill-induced injury (Fosamax), large diaphragmatic hernia, biopsies from small bowel consistent with celiac disease    Family History  Problem Relation Age of Onset  . Cancer    . Kidney disease      History  Substance Use Topics  . Smoking status: Never Smoker   . Smokeless tobacco: Not on file  . Alcohol Use: Yes     rarely    OB History    Grav Para Term Preterm Abortions TAB SAB Ect Mult Living                  Review of Systems  Constitutional: Negative for fever and chills.  HENT: Negative for congestion, facial swelling and neck stiffness.   Eyes: Negative for pain and visual disturbance.  Respiratory: Negative for cough and wheezing.   Cardiovascular: Negative for chest pain and palpitations.  Gastrointestinal: Negative for abdominal pain.  Genitourinary: Negative for dysuria and frequency.  Musculoskeletal: Negative for back  pain.       Right shoulder, left hip and left knee pain  Skin:       Bruising   Neurological: Negative for syncope and headaches.  Psychiatric/Behavioral: Negative for behavioral problems and confusion. The patient is not nervous/anxious.     Allergies  Gluten  Home Medications   Current Outpatient Rx  Name Route Sig Dispense Refill  . ACETAMINOPHEN 325 MG PO TABS Oral Take 650 mg by mouth every 6 (six) hours as needed. For pain    . ALENDRONATE SODIUM 70 MG PO TABS Oral Take 70 mg by mouth every 7 (seven) days. On Fridays of each weekTake with a full glass of water on an empty stomach. Fridays    . DILTIAZEM HCL ER COATED BEADS 120 MG PO CP24 Oral Take 120 mg by mouth daily.     Marland Kitchen LEVOTHYROXINE SODIUM 50 MCG PO TABS Oral Take 50 mcg by mouth every morning.    Marland Kitchen LISINOPRIL 40 MG PO TABS Oral Take 40 mg by mouth every morning.     Marland Kitchen QUETIAPINE FUMARATE 100 MG PO TABS Oral Take 100 mg by mouth at bedtime.        BP 143/77  Pulse 67  Temp(Src) 97.5 F (36.4 C) (Oral)  Resp  18  Ht 5\' 1"  (1.549 m)  Wt 100 lb (45.36 kg)  BMI 18.89 kg/m2  SpO2 97%  Physical Exam  Nursing note and vitals reviewed. Constitutional: She is oriented to person, place, and time. No distress.  HENT:  Head: Normocephalic and atraumatic.  Right Ear: External ear normal.  Left Ear: External ear normal.  Nose: Nose normal.  Mouth/Throat: Oropharynx is clear and moist.  Eyes: Conjunctivae and EOM are normal. Pupils are equal, round, and reactive to light.  Neck: Normal range of motion. Neck supple.  Cardiovascular: Normal rate.   Pulmonary/Chest: Effort normal and breath sounds normal.  Abdominal: Soft. Bowel sounds are normal. There is no tenderness.  Musculoskeletal:       Ecchymosis and edema noted right clavicle. Tender on exam. Left hip tender with palpation. Left knee with edema and ecchymosis, tender with palpation, unable to bear weight. No tenderness over spinal cord.  Neurological: She is  alert and oriented to person, place, and time. No cranial nerve deficit.       Distal pulses equal with adequate circulation.  Skin: Skin is warm and dry.       Ecchymosis of left knee and right clavicle.  Psychiatric: She has a normal mood and affect. Her behavior is normal. Judgment and thought content normal.    ED Course  Procedures Dg Clavicle Right  04/01/2012  *RADIOLOGY REPORT*  Clinical Data: Larey Seat.  Pain and swelling.  RIGHT CLAVICLE - 2+ VIEWS  Comparison: 09/25/2011  Findings:  there is chronic deformity of the clavicle with upward angulation.  There is chronic appearing deformity of the humeral head and neck.  No evidence of acute fracture or dislocation.  Impression: Chronic deformity of the right clavicle and humeral head/neck.  No definable acute injury.  Original Report Authenticated By: Thomasenia Sales, M.D.   Dg Hip Complete Left  04/01/2012  *RADIOLOGY REPORT*  Clinical Data: Fall.  Pain.  LEFT HIP - COMPLETE 2+ VIEW  Comparison: 04/13/2009  Findings: Bipolar hemiarthroplasty is present, unchanged in appearance.  No evidence of fracture or loosening.  Other structures of the left hemi pelvis appear unremarkable.  IMPRESSION: No change.  Good appearance of the left bipolar hemiarthroplasty. No traumatic finding.  Original Report Authenticated By: Thomasenia Sales, M.D.   Dg Knee Complete 4 Views Left  04/01/2012  *RADIOLOGY REPORT*  Clinical Data: Fall.  Pain.  LEFT KNEE - COMPLETE 4+ VIEW  Comparison: None.  Findings: There is a nondisplaced fracture of the patella.  There is a joint effusion with a fat fluid level.  No fracture of the femur, tibia or fibula is seen.  IMPRESSION: Nondisplaced patellar fracture.  Lipohemarthrosis.  Original Report Authenticated By: Thomasenia Sales, M.D.   I have reviewed this patient's vital signs, nurses notes, appropriate labs and imaging. I have discussed this patient with Dr. Clarene Duke.  Assessment: Left knee fracture  Plan:  Discussed with Dr.  Romeo Apple, will admit MDM          Janne Napoleon, NP 04/01/12 1327

## 2012-04-01 NOTE — ED Notes (Signed)
Pt walking track, fell. C/o L knee pain. Obvious deformity to r clavicle. States has an old fx there.swelling and slight bruising noted. States did fall on it today and is not usually swollen like it is now. Pt hurts when she moves her right arm. Pt is alert/oriented. nad at this time. Denies hitting head. L knee swollen and starting to bruise. Pedal pulses strong.

## 2012-04-01 NOTE — ED Provider Notes (Signed)
Medical screening examination/treatment/procedure(s) were performed by non-physician practitioner and as supervising physician I was immediately available for consultation/collaboration.   Laray Anger, DO 04/01/12 2039

## 2012-04-01 NOTE — H&P (Signed)
Felicia Harvey is an 78 y.o. female.   Chief Complaint: Pain left knee HPI: A pressure female was injured today walking with a cane she tripped over her left foot landed on her left knee complaint pain and inability to ambulate was brought into the hospital by EMS. Evaluation the emergency room revealed the patient had a nondisplaced fracture left patella she cannot ambulate and therefore was admitted. Pain is moderate. Pain is sharp. Pain is over the left kneecap. Pain came on acutely today.  Past Medical History  Diagnosis Date  . Thyroid disease   . HTN (hypertension)   . Reflux   . Anxiety   . Osteopenia   . GERD (gastroesophageal reflux disease)   . Hypothyroidism   . Dysphagia 09/21/2011  . Chest pain 09/21/2011  . Schizophrenia   . Polydipsia 09/21/2011  . 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 Celiac disease     Past Surgical History  Procedure Date  . Left shoulder   . Thyroidectomy   . Vertebroplasty   . Left hip replacement   . Esophagogastroduodenoscopy 09/27/2011    Inflammatory changes midesophagus most likely pill-induced injury (Fosamax), large diaphragmatic hernia, biopsies from small bowel consistent with celiac disease    Family History  Problem Relation Age of Onset  . Cancer    . Kidney disease     Social History:  reports that she has never smoked. She does not have any smokeless tobacco history on file. She reports that she drinks alcohol. She reports that she does not use illicit drugs.  Allergies:  Allergies  Allergen Reactions  . Gluten     Celiac disease    Medications Prior to Admission  Medication Sig Dispense Refill  . acetaminophen (TYLENOL) 325 MG tablet Take 650 mg by mouth every 6 (six) hours as needed. For pain      . alendronate (FOSAMAX) 70 MG tablet Take 70 mg by mouth every 7 (seven) days. On Fridays of each weekTake with a full glass of water on an empty stomach. Fridays      . diltiazem (CARDIZEM CD) 120 MG 24 hr capsule Take 120  mg by mouth daily.       Marland Kitchen levothyroxine (SYNTHROID, LEVOTHROID) 50 MCG tablet Take 50 mcg by mouth every morning.      Marland Kitchen lisinopril (PRINIVIL,ZESTRIL) 40 MG tablet Take 40 mg by mouth every morning.       Marland Kitchen QUEtiapine (SEROQUEL) 100 MG tablet Take 100 mg by mouth at bedtime.          Results for orders placed during the hospital encounter of 04/01/12 (from the past 48 hour(s))  CBC     Status: Abnormal   Collection Time   04/01/12  3:50 PM      Component Value Range Comment   WBC 9.5  4.0 - 10.5 (K/uL)    RBC 3.88  3.87 - 5.11 (MIL/uL)    Hemoglobin 11.5 (*) 12.0 - 15.0 (g/dL)    HCT 96.0 (*) 45.4 - 46.0 (%)    MCV 88.1  78.0 - 100.0 (fL)    MCH 29.6  26.0 - 34.0 (pg)    MCHC 33.6  30.0 - 36.0 (g/dL)    RDW 09.8  11.9 - 14.7 (%)    Platelets 202  150 - 400 (K/uL)   DIFFERENTIAL     Status: Abnormal   Collection Time   04/01/12  3:50 PM      Component Value Range Comment   Neutrophils Relative  79 (*) 43 - 77 (%)    Lymphocytes Relative 14  12 - 46 (%)    Monocytes Relative 7  3 - 12 (%)    Eosinophils Relative 0  0 - 5 (%)    Basophils Relative 0  0 - 1 (%)    Neutro Abs 7.5  1.7 - 7.7 (K/uL)    Lymphs Abs 1.3  0.7 - 4.0 (K/uL)    Monocytes Absolute 0.7  0.1 - 1.0 (K/uL)    Eosinophils Absolute 0.0  0.0 - 0.7 (K/uL)    Basophils Absolute 0.0  0.0 - 0.1 (K/uL)    WBC Morphology ATYPICAL LYMPHOCYTES      Smear Review LARGE PLATELETS PRESENT     BASIC METABOLIC PANEL     Status: Abnormal   Collection Time   04/01/12  3:50 PM      Component Value Range Comment   Sodium 133 (*) 135 - 145 (mEq/L)    Potassium 3.6  3.5 - 5.1 (mEq/L)    Chloride 98  96 - 112 (mEq/L)    CO2 23  19 - 32 (mEq/L)    Glucose, Bld 126 (*) 70 - 99 (mg/dL)    BUN 9  6 - 23 (mg/dL)    Creatinine, Ser 4.78  0.50 - 1.10 (mg/dL)    Calcium 8.9  8.4 - 10.5 (mg/dL)    GFR calc non Af Amer 82 (*) >90 (mL/min)    GFR calc Af Amer >90  >90 (mL/min)   PROTIME-INR     Status: Normal   Collection Time   04/01/12   3:50 PM      Component Value Range Comment   Prothrombin Time 14.5  11.6 - 15.2 (seconds)    INR 1.11  0.00 - 1.49     Dg Clavicle Right  04/01/2012  *RADIOLOGY REPORT*  Clinical Data: Larey Seat.  Pain and swelling.  RIGHT CLAVICLE - 2+ VIEWS  Comparison: 09/25/2011  Findings:  there is chronic deformity of the clavicle with upward angulation.  There is chronic appearing deformity of the humeral head and neck.  No evidence of acute fracture or dislocation.  Impression: Chronic deformity of the right clavicle and humeral head/neck.  No definable acute injury.  Original Report Authenticated By: Thomasenia Sales, M.D.   Dg Hip Complete Left  04/01/2012  *RADIOLOGY REPORT*  Clinical Data: Fall.  Pain.  LEFT HIP - COMPLETE 2+ VIEW  Comparison: 04/13/2009  Findings: Bipolar hemiarthroplasty is present, unchanged in appearance.  No evidence of fracture or loosening.  Other structures of the left hemi pelvis appear unremarkable.  IMPRESSION: No change.  Good appearance of the left bipolar hemiarthroplasty. No traumatic finding.  Original Report Authenticated By: Thomasenia Sales, M.D.   Dg Chest Port 1 View  04/01/2012  *RADIOLOGY REPORT*  Clinical Data: 78 year old female with fracture of the patella, surgery.  History of chest pain, hypertension.  PORTABLE CHEST - 1 VIEW  Comparison: 09/25/2011 and earlier.  Findings: Portable upright AP view at 1615 hours.  Stable large lung volumes.  Stable biapical scarring.  No pneumothorax, pulmonary edema, pleural effusion or confluent pulmonary opacity. Stable cardiomegaly and mediastinal contours.  Chronic deformity of the right clavicle is stable.  Lumbar compression deformities status post augmentation. Chronic bilateral humeral head deformities.  IMPRESSION: No acute cardiopulmonary abnormality.  Original Report Authenticated By: Harley Hallmark, M.D.   Dg Knee Complete 4 Views Left  04/01/2012  *RADIOLOGY REPORT*  Clinical Data: Fall.  Pain.  LEFT KNEE - COMPLETE 4+ VIEW   Comparison: None.  Findings: There is a nondisplaced fracture of the patella.  There is a joint effusion with a fat fluid level.  No fracture of the femur, tibia or fibula is seen.  IMPRESSION: Nondisplaced patellar fracture.  Lipohemarthrosis.  Original Report Authenticated By: Thomasenia Sales, M.D.    Review of Systems  Constitutional: Negative.   HENT: Negative.   Eyes: Negative.   Respiratory: Negative.   Cardiovascular: Negative.   Gastrointestinal: Negative.   Genitourinary: Negative.   Musculoskeletal: Positive for joint pain.  Skin: Negative.   Neurological: Negative.   Endo/Heme/Allergies: Negative.   Psychiatric/Behavioral: Negative.     Blood pressure 152/82, pulse 105, temperature 97.5 F (36.4 C), temperature source Oral, resp. rate 20, height 5\' 1"  (1.549 m), weight 100 lb (45.36 kg), SpO2 99.00%. Physical Exam  Musculoskeletal:       Vital signs are stable as recorded  General appearance is normal  The patient is alert and oriented x3  The patient's mood and affect are normal  Gait assessment: can not walk left knee pain  The cardiovascular exam reveals normal pulses and temperature without edema swelling.  The lymphatic system is negative for palpable lymph nodes  The sensory exam is normal.  There are no pathologic reflexes.  Balance is normal.   Exam of the left knee: Inspection reveals tenderness and swelling of the left knee joint with tenderness of the patella. Range of motion not assessed. Stability of the joint is normal. Muscle tone is normal. Skin abrasion as noted  Right knee inspection was normal range of motion normal strength assessment normal stability assessment normal  Right upper extremity there is a large hematoma over the right clavicle normal range of motion in the hand wrist and elbow chronic decreased range of motion in the right shoulder.  Left shoulder no deformity no tenderness decreased range of motion chronic stability and  strength normal     Assessment/Plan X-rays were obtained and show a nondisplaced patellar fracture left knee  Plan knee immobilizer was applied.  Weight bear as tolerated in the brace for 6 weeks. Start physical therapy tomorrow. Assess need for skilled nursing care versus home care  The right shoulder although not fractured has a large hematoma and may compromise weightbearing ability with walker.  Fuller Canada 04/01/2012, 6:20 PM

## 2012-04-02 MED ORDER — SODIUM CHLORIDE 0.9 % IJ SOLN
3.0000 mL | Freq: Two times a day (BID) | INTRAMUSCULAR | Status: DC
  Administered 2012-04-02 – 2012-04-03 (×3): 3 mL via INTRAVENOUS
  Filled 2012-04-02 (×5): qty 3

## 2012-04-02 MED ORDER — ONDANSETRON HCL 4 MG/2ML IJ SOLN: 4.0000 mg | Freq: Four times a day (QID) | INTRAMUSCULAR | Status: DC | PRN

## 2012-04-02 MED ORDER — SODIUM CHLORIDE 0.9 % IJ SOLN
INTRAMUSCULAR | Status: AC
  Filled 2012-04-02: qty 3

## 2012-04-02 NOTE — Evaluation (Signed)
Physical Therapy Evaluation Patient Details Name: Felicia Harvey MRN: [id number] DOB: 01-Oct-1929 Today's Date: 04/02/2012 Time: 4098-1191 PT Time Calculation (min): 41 min  PT Assessment / Plan / Recommendation Clinical Impression  Pt is well know to this therapist from previous admission.  She is alert, oriented and very cooperative.  Pain is well controlled.  Mod assist needed for bed mobility and transfers, only able to ambulate 15' with walker.  She appears to be very frail and deconditioned and reports frequent falls due to her legs "tangling up".  She plans to return home at d/c with assist of her son and HHPT.  Hopefully, she will be able to improve overall mobility/endurance while here in order to make that happen.    PT Assessment  Patient needs continued PT services    Follow Up Recommendations  Home health PT    Equipment Recommendations  Defer to next venue    Frequency 7X/week    Precautions / Restrictions Precautions Precautions: Fall Required Braces or Orthoses: Knee Immobilizer - Left Knee Immobilizer - Left: On at all times Restrictions Weight Bearing Restrictions: Yes RUE Weight Bearing: Weight bearing as tolerated LLE Weight Bearing: Non weight bearing   Pertinent Vitals/Pain       Mobility  Bed Mobility Bed Mobility: Supine to Sit;Scooting to HOB Supine to Sit: 4: Min assist;HOB flat Scooting to HOB: 4: Min guard Details for Bed Mobility Assistance: mobiolity is very slow and labored, R shoulder contusion causes some pain during use of arm Transfers Transfers: Sit to Stand;Stand to Sit Sit to Stand: 3: Mod assist;From bed;From chair/3-in-1;With upper extremity assist Stand to Sit: 3: Mod assist;With upper extremity assist;To chair/3-in-1 Details for Transfer Assistance:  cues needed for proper hand placement---pt is generally weak and deconditioned Ambulation/Gait Ambulation/Gait Assistance: 4: Min assist Ambulation Distance (Feet): 15  Feet Assistive device: Rolling walker Ambulation/Gait Assistance Details: pt does not have much pain with weight bearing LLE and apears to be mostly FWB L....cues given for proper gait pattern, but she tends to just shuffle along Gait Pattern: Shuffle;Narrow base of support;Trunk flexed Stairs: No Wheelchair Mobility Wheelchair Mobility: No    Exercises     PT Goals Acute Rehab PT Goals PT Goal Formulation: With patient Time For Goal Achievement: 04/09/12 Potential to Achieve Goals: Good Pt will go Supine/Side to Sit: with supervision;with HOB 0 degrees PT Goal: Supine/Side to Sit - Progress: Goal set today Pt will go Sit to Supine/Side: with supervision;with HOB 0 degrees PT Goal: Sit to Supine/Side - Progress: Goal set today Pt will go Sit to Stand: with supervision;with upper extremity assist PT Goal: Sit to Stand - Progress: Goal set today Pt will go Stand to Sit: with supervision;with upper extremity assist PT Goal: Stand to Sit - Progress: Goal set today Pt will Transfer Bed to Chair/Chair to Bed: with min assist PT Transfer Goal: Bed to Chair/Chair to Bed - Progress: Goal set today Pt will Ambulate: 16 - 50 feet;with supervision;with rolling walker PT Goal: Ambulate - Progress: Goal set today Pt will Go Up / Down Stairs: 3-5 stairs;with min assist;with rail(s) PT Goal: Up/Down Stairs - Progress: Goal set today  Visit Information  Last PT Received On: 04/02/12    Subjective Data  Subjective: I have to go to the bathroom.Marland KitchenMarland KitchenI can't use a bedpan Patient Stated Goal: return home to independence   Prior Functioning  Home Living Lives With: Son Available Help at Discharge: Family Type of Home: House Home Access: Stairs to enter Entrance  Stairs-Number of Steps: 5 Entrance Stairs-Rails: Right Home Layout: One level Bathroom Toilet: Handicapped height Bathroom Accessibility: Yes How Accessible: Accessible via walker Home Adaptive Equipment: Walker - rolling;Straight  cane;Bedside commode/3-in-1 Prior Function Level of Independence: Independent with assistive device(s) Able to Take Stairs?: Yes Driving: No Vocation: Retired Musician: No difficulties    Cognition  Overall Cognitive Status: Appears within functional limits for tasks assessed/performed Arousal/Alertness: Awake/alert Orientation Level: Appears intact for tasks assessed Behavior During Session: [facility] for tasks performed    Extremity/Trunk Assessment Right Upper Extremity Assessment RUE ROM/Strength/Tone: Unable to fully assess (contusion to R shoulder) Left Upper Extremity Assessment LUE ROM/Strength/Tone: [facility] for tasks assessed Right Lower Extremity Assessment RLE ROM/Strength/Tone: [facility] for tasks assessed Left Lower Extremity Assessment LLE ROM/Strength/Tone: Unable to fully assess (LE is in immobilizer)   Balance Balance Balance Assessed: No  End of Session PT - End of Session Equipment Utilized During Treatment: Gait belt Activity Tolerance: Patient limited by fatigue Patient left: in chair;with call bell/phone within reach Nurse Communication: Mobility status   Konrad Penta 04/02/2012, 10:57 AM

## 2012-04-02 NOTE — Progress Notes (Signed)
Subjective: I M OKAY !   Objective: Vital signs in last 24 hours: Temp:  [97.9 F (36.6 C)-98.4 F (36.9 C)] 98.4 F (36.9 C) (04/25 0547) Pulse Rate:  [83] 83  (04/25 0547) Resp:  [18-19] 19  (04/25 0547) BP: (118-122)/(67-68) 118/68 mmHg (04/25 0547) SpO2:  [93 %-95 %] 93 % (04/25 0547)  Intake/Output from previous day: 04/24 0701 - 04/25 0700 In: 120 [P.O.:120] Out: -  Intake/Output this shift:     Basename 04/01/12 1550  HGB 11.5*    Basename 04/01/12 1550  WBC 9.5  RBC 3.88  HCT 34.2*  PLT 202    Basename 04/01/12 1550  NA 133*  K 3.6  CL 98  CO2 23  BUN 9  CREATININE 0.65  GLUCOSE 126*  CALCIUM 8.9    Basename 04/01/12 1550  LABPT --  INR 1.11    Neurologically intact  Assessment/Plan: PT  PROBABLY [facility] WITH REHAB    Fuller Canada 04/02/2012, 2:32 PM

## 2012-04-02 NOTE — Progress Notes (Signed)
Physical Therapy Treatment Patient Details Name: Felicia Harvey MRN: [id number] DOB: [birth year] Today's Date: 04/02/2012 Time: 9147-8295 PT Time Calculation (min): 34 min  PT Assessment / Plan / Recommendation Comments on Treatment Session  Pt feeling much stronger this aftrnoon with functional gait using a walker.  She still needs some assist with transfers and bed mobility.  After walking 60", she did experience a feeling of being "faint" b, but once assuming supine position she returned to baseline..  She tried to urinate over a 10 min span, but was unable to produce more than a few drops of urine.  RN is aware.  Son is also concerned that she has had problems with low Sodium levels in the past  and wants to be sure this is being checked.  RN is aware.            Follow Up Recommendations  Home health PT    Equipment Recommendations  Defer to next venue    Frequency 7X/week   Plan Discharge plan remains appropriate;Frequency remains appropriate    Precautions / Restrictions Precautions Precautions: Fall Required Braces or Orthoses: Knee Immobilizer - Left Knee Immobilizer - Left: On at all times Restrictions Weight Bearing Restrictions: Yes RUE Weight Bearing: Weight bearing as tolerated   Pertinent Vitals/Pain     Mobility  Bed Mobility Bed Mobility: Sit to Supine Supine to Sit: 4: Min assist;HOB flat Sit to Supine: 4: Min assist Scooting to HOB: 4: Min guard Details for Bed Mobility Assistance: mobiolity is very slow and labored, R shoulder contusion causes some pain during use of arm Transfers Transfers: Sit to Stand;Stand to Sit Sit to Stand: 4: Min assist;With upper extremity assist Stand to Sit: 4: Min assist;With upper extremity assist Details for Transfer Assistance:  cues needed for proper hand placement---pt is generally weak and deconditioned Ambulation/Gait Ambulation/Gait Assistance: 4: Min guard Ambulation Distance (Feet): 60 Feet Assistive device:  Rolling walker Ambulation/Gait Assistance Details: pt does not have much pain with weight bearing LLE and apears to be mostly FWB L....cues given for proper gait pattern, but she tends to just shuffle along Gait Pattern: Shuffle General Gait Details: FWB LLE Stairs: No Wheelchair Mobility Wheelchair Mobility: No    Exercises     PT Goals Acute Rehab PT Goals PT Goal Formulation: With patient Time For Goal Achievement: 04/09/12 Potential to Achieve Goals: Good Pt will go Supine/Side to Sit: with supervision;with HOB 0 degrees PT Goal: Supine/Side to Sit - Progress: Goal set today Pt will go Sit to Supine/Side: with supervision;with HOB 0 degrees PT Goal: Sit to Supine/Side - Progress: Progressing toward goal Pt will go Sit to Stand: with supervision;with upper extremity assist PT Goal: Sit to Stand - Progress: Progressing toward goal Pt will go Stand to Sit: with supervision;with upper extremity assist PT Goal: Stand to Sit - Progress: Progressing toward goal Pt will Transfer Bed to Chair/Chair to Bed: with min assist PT Transfer Goal: Bed to Chair/Chair to Bed - Progress: Goal set today Pt will Ambulate: 16 - 50 feet;with supervision;with rolling walker PT Goal: Ambulate - Progress: Met Pt will Go Up / Down Stairs: 3-5 stairs;with min assist;with rail(s) PT Goal: Up/Down Stairs - Progress: Goal set today  Visit Information  Last PT Received On: 04/02/12    Subjective Data  Subjective: feeling better Patient Stated Goal: return home to independence   Cognition  Overall Cognitive Status: Appears within functional limits for tasks assessed/performed Arousal/Alertness: Awake/alert Orientation Level: Appears intact for tasks assessed  Behavior During Session: [facility] for tasks performed    Balance  Balance Balance Assessed: No  End of Session PT - End of Session Equipment Utilized During Treatment: Gait belt Activity Tolerance: Patient limited by fatigue (unable to do any  exercise due to fatigue) Patient left: in bed;with call bell/phone within reach;with bed alarm set;with family/visitor present Nurse Communication: Mobility status    Konrad Penta 04/02/2012, 1:45 PM

## 2012-04-02 NOTE — Progress Notes (Signed)
Referred to this CSW today for ?SNF. Chart reviewed- noted MD and PT notes indicating plans to d/c home with [facility] and DME. CSW to sign off- please contact us if SW needs arise. Reece Levy, MSW, Theresia Majors [phone]

## 2012-04-03 MED ORDER — ONDANSETRON HCL 4 MG/2ML IJ SOLN: 4.0000 mg | Freq: Four times a day (QID) | INTRAMUSCULAR | Status: DC | PRN

## 2012-04-03 MED ORDER — SODIUM CHLORIDE 0.9 % IV BOLUS (SEPSIS): 250.0000 mL | Freq: Once | INTRAVENOUS | Status: DC

## 2012-04-03 MED ORDER — SODIUM CHLORIDE 0.9 % IV SOLN
INTRAVENOUS | Status: DC
  Administered 2012-04-03: 1000 mL via INTRAVENOUS
  Administered 2012-04-03: 13:00:00 via INTRAVENOUS

## 2012-04-03 NOTE — Progress Notes (Signed)
Physical Therapy Treatment Patient Details Name: Felicia Harvey MRN: [id number] DOB: 1929-10-02 Today's Date: 04/03/2012 Time: 1410-1445 PT Time Calculation (min): 35 min  PT Assessment / Plan / Recommendation Comments on Treatment Session  Pt now to return home at d/c.  Her general strength and endurance are significantly improved, however it is still compromised.  Her gait will be functional in the home setting, but she will need some assist with transfers.  Her son is quite capable of this.  I am concerned about the 4 steps at the entrance to her home.  I have no confidence that she will have enough strength to negotiate them, so will call her son and suggest that  they obtain a w/c to use for carrying her up the steps.    Follow Up Recommendations  Home health PT    Equipment Recommendations  Wheelchair (measurements)    Frequency     Plan Discharge plan needs to be updated;Equipment recommendations need to be updated    Precautions / Restrictions Restrictions Weight Bearing Restrictions: Yes RUE Weight Bearing: Weight bearing as tolerated LLE Weight Bearing: Non weight bearing   Pertinent Vitals/Pain     Mobility  Transfers Sit to Stand: 4: Min assist;With upper extremity assist Stand to Sit: 4: Min assist;With upper extremity assist Details for Transfer Assistance: much better control of descent to chair Ambulation/Gait Ambulation/Gait Assistance: 4: Min guard Ambulation Distance (Feet): 80 Feet Assistive device: Rolling walker Ambulation/Gait Assistance Details: stable gait pattern, but very fatigued after gait Gait Pattern: Shuffle Stairs: No (too fatigued-not enough stamina) Wheelchair Mobility Wheelchair Mobility: No    Exercises     PT Goals Acute Rehab PT Goals PT Goal: Sit to Stand - Progress: Progressing toward goal PT Goal: Stand to Sit - Progress: Progressing toward goal PT Transfer Goal: Bed to Chair/Chair to Bed - Progress: Progressing toward goal PT  Goal: Ambulate - Progress: Met PT Goal: Up/Down Stairs - Progress: Discontinued (comment) (not enough stamina to accompliish)  Visit Information  Last PT Received On: 04/03/12    Subjective Data  Subjective: feeling a bit stronger   Cognition       Balance     End of Session PT - End of Session Equipment Utilized During Treatment: Gait belt Activity Tolerance: Patient limited by fatigue Patient left: in chair;with call bell/phone within reach;with nursing in room Nurse Communication: Mobility status    Konrad Penta 04/03/2012, 2:56 PM

## 2012-04-03 NOTE — Progress Notes (Signed)
Physical Therapy Treatment Patient Details Name: Felicia Harvey MRN: [id number] DOB: 07-Dec-1929 Today's Date: 04/03/2012 Time: 2841-3244 PT Time Calculation (min): 43 min  PT Assessment / Plan / Recommendation Comments on Treatment Session  Pt seems to have regressed overnight.  Pain is well controlled but endurance is poor and strength is compromised.  She is only able to walk minimal distance with  walker (mostly FWB LLE) with walker before becoming "dizzy".  Transfers remain labored.  We attempted gait training on step...she required max assist for 1 step and felt "faint", immediately returned to chair.  At this point, I will suggest that pt will need transfer to SNF at d/c.  Pt and son are in agreement.            )    Follow Up Recommendations  Skilled nursing facility    Equipment Recommendations       Frequency Min 5X/week   Plan Discharge plan needs to be updated;Frequency needs to be updated    Precautions / Restrictions     Pertinent Vitals/Pain     Mobility  Bed Mobility Supine to Sit: 4: Min assist Scooting to [facility]: 4: Min guard Transfers Sit to Stand: 4: Min assist;With upper extremity assist;From bed;From chair/3-in-1 Stand to Sit: To chair/3-in-1;3: Mod assist Details for Transfer Assistance: when sitting, she tends to allow herself to collapse into the chair--mod assist needed to control descent of trunk Ambulation/Gait Ambulation/Gait Assistance: 4: Min guard Ambulation Distance (Feet): 20 Feet (also 12' x1) Ambulation/Gait Assistance Details: mostly FWB LLE Gait Pattern: Shuffle Stairs: Yes Stairs Assistance: 2: Max Estate agent Assistance Details (indicate cue type and reason):  extreme difficulty ascending 1 step...would not  DF L foot to neutral nor was she able to fully extend R knee in stance...became very "dizzy" with effort and needed to immediatlly return to chair Stair Management Technique: One rail Right;Forwards Number of Stairs: 1  Wheelchair  Mobility Wheelchair Mobility: No    Exercises     PT Goals Acute Rehab PT Goals PT Goal: Supine/Side to Sit - Progress: Not progressing PT Goal: Sit to Supine/Side - Progress: Not progressing PT Goal: Sit to Stand - Progress: Not progressing PT Goal: Stand to Sit - Progress: Not progressing PT Transfer Goal: Bed to Chair/Chair to Bed - Progress: Not progressing PT Goal: Up/Down Stairs - Progress: Not progressing  Visit Information  Last PT Received On: 04/03/12    Subjective Data  Subjective: slept well last night Patient Stated Goal: none stated   Cognition       Balance     End of Session PT - End of Session Equipment Utilized During Treatment: Gait belt Activity Tolerance: Other (comment);Patient limited by fatigue (pt very "fragile"--becomes "faint" with minimal activity) Patient left: in chair;with call bell/phone within reach;with family/visitor present Nurse Communication: Mobility status    Konrad Penta 04/03/2012, 9:15 AM

## 2012-04-03 NOTE — Progress Notes (Signed)
Subjective: Dizziness  The patient was admitted to observation status secondary to inability to ambulate after a left patellar fracture. After 2 days of physical therapy she reports dizziness when getting up. We will therefore start IV fluids perhaps she is dehydrated.  She does not qualify for skilled nursing care.  The plan is to discharge her tomorrow with home health and physical therapy. Her laboratory studies are normal.    Objective: Vital signs in last 24 hours: Temp:  [97.9 F (36.6 C)-98.5 F (36.9 C)] 97.9 F (36.6 C) (04/26 0424) Pulse Rate:  [70] 70  (04/26 0424) Resp:  [18] 18  (04/26 0424) BP: (95-126)/(52-87) 95/52 mmHg (04/26 0424) SpO2:  [93 %-94 %] 94 % (04/26 0424)  Intake/Output from previous day: 04/25 0701 - 04/26 0700 In: 240 [P.O.:240] Out: 250 [Urine:250] Intake/Output this shift:     Basename 04/01/12 1550  HGB 11.5*    Basename 04/01/12 1550  WBC 9.5  RBC 3.88  HCT 34.2*  PLT 202    Basename 04/01/12 1550  NA 133*  K 3.6  CL 98  CO2 23  BUN 9  CREATININE 0.65  GLUCOSE 126*  CALCIUM 8.9    Basename 04/01/12 1550  LABPT --  INR 1.11    Neurologically intact Neurovascular intact Sensation intact distally Compartment soft  Assessment/Plan: Start IV fluids  Discharge tomorrow with home health.   Fuller Canada 04/03/2012, 12:01 PM

## 2012-04-03 NOTE — Progress Notes (Signed)
  CARE MANAGEMENT NOTE 04/03/2012  Patient:  Felicia Harvey, Felicia Harvey   Account Number:  [id number]  Date Initiated:  04/03/2012  Documentation initiated by:  Anibal Henderson  Subjective/Objective Assessment:   Admitted with Fx patella, from a fall. Pt is from home, lives with son, and will return home. SNF was discussed, but pt is OBV and will not have an inpt stay. This was explained to pt and son, and they voice understanding.     Action/Plan:   They use [facility] for [facility] and this is set up. pt has DME from previous ortho. surgery, and has no DME needs.   Anticipated DC Date:  04/04/2012   Anticipated DC Plan:  HOME W HOME HEALTH SERVICES      DC Planning Services  CM consult      [facility] Choice  HOME HEALTH   Choice offered to / List presented to:  C-4 Adult Children        HH arranged  HH-2 PT  HH-3 OT  HH-4 NURSE'S AIDE      HH agency  [facility]   Status of service:  In process, will continue to follow Medicare Important Message given?   (If response is "NO", the following Medicare IM given date fields will be blank) Date Medicare IM given:   Date Additional Medicare IM given:    Discharge Disposition:    Per UR Regulation:  Reviewed for med. necessity/level of care/duration of stay  If discussed at Long Length of Stay Meetings, dates discussed:    Comments:  04/03/12 1600 Anibal Henderson RN

## 2012-04-03 NOTE — Progress Notes (Signed)
UR Chart Review Completed  

## 2012-04-03 NOTE — Progress Notes (Signed)
Referred to this CSW today for ?SNF. Chart reviewed and have spoken with RNCM who states patient is an OBS and thus cannot go to a Medicare SNF bed. Family and  patient plan  to d/c home with [facility] and DME. CSW to sign off- please contact us if SW needs arise. Reece Levy, MSW, Theresia Majors [phone]

## 2012-04-04 MED ORDER — PROMETHAZINE HCL 12.5 MG PO TABS: 12.5000 mg | ORAL_TABLET | Freq: Four times a day (QID) | ORAL | Status: DC | PRN

## 2012-04-04 MED ORDER — HYDROCODONE-ACETAMINOPHEN 5-325 MG PO TABS: 1.0000 | ORAL_TABLET | Freq: Four times a day (QID) | ORAL | Status: AC | PRN

## 2012-04-04 NOTE — Discharge Summary (Signed)
Physician Discharge Summary  Patient ID: Felicia Harvey MRN: [id number] DOB/AGE: Nov 27, 1929 78 y.o.  Admit date: 04/01/2012 Discharge date: 04/04/2012  Admission Diagnoses: fracture left patella   Discharge Diagnoses: fracture left patella  Active Problems:  Fracture of patella, left, closed   Discharged Condition: good  Hospital Course: The patient was admitted to the hospital for inability to ambulate with a nondisplaced left patella fracture she was placed in a brace. Physical therapy was initiated and she was able to ambulate weightbearing as tolerated with a walker. Consults: None  Significant Diagnostic Studies: none  Treatments: IV hydration and therapies: PT  Discharge Exam: Blood pressure 114/68, pulse 89, temperature 98.1 F (36.7 C), temperature source Oral, resp. rate 18, height 5\' 1"  (1.549 m), weight 100 lb (45.36 kg), SpO2 94.00%. General appearance: alert, cooperative and appears stated age Extremities: Homans sign is negative, no sign of DVT Pulses: 2+ and symmetric Skin: Skin color, texture, turgor normal. No rashes or lesions Neurologic: Grossly normal  Disposition: 01-Home or Self Care  Discharge Orders    Future Orders Please Complete By Expires   Diet - low sodium heart healthy      Increase activity slowly      Discharge instructions      Comments:   Brace full time weight bear as tolerated     Medication List  As of 04/04/2012 10:12 AM   TAKE these medications         acetaminophen 325 MG tablet   Commonly known as: TYLENOL   Take 650 mg by mouth every 6 (six) hours as needed. For pain      alendronate 70 MG tablet   Commonly known as: FOSAMAX   Take 70 mg by mouth every 7 (seven) days. On Fridays of each weekTake with a full glass of water on an empty stomach. Fridays      diltiazem 120 MG 24 hr capsule   Commonly known as: CARDIZEM CD   Take 120 mg by mouth daily.      HYDROcodone-acetaminophen 5-325 MG per tablet   Commonly known as:  NORCO   Take 1 tablet by mouth every 6 (six) hours as needed for pain.      levothyroxine 50 MCG tablet   Commonly known as: SYNTHROID, LEVOTHROID   Take 50 mcg by mouth every morning.      lisinopril 40 MG tablet   Commonly known as: PRINIVIL,ZESTRIL   Take 40 mg by mouth every morning.      promethazine 12.5 MG tablet   Commonly known as: PHENERGAN   Take 1 tablet (12.5 mg total) by mouth every 6 (six) hours as needed for nausea.      QUEtiapine 100 MG tablet   Commonly known as: SEROQUEL   Take 100 mg by mouth at bedtime.           Follow-up Information    Follow up with Fuller Canada, MD on 04/16/2012. (xray at office )    Contact information:   [address], [address] [place] [address] [phone]         Weightbearing status as tolerated  X-rays AP lateral left knee in the office.  Signed: Fuller Canada 04/04/2012, 10:12 AM

## 2012-04-04 NOTE — Discharge Planning (Signed)
Patient has discharged home with [facility] for Rehabilitation of Left Fractured patella.  Also, patient and son instructed to call Dr. Mort Sawyers office on 04/06/2012 for an appointment on 04/16/2012.  Prescriptions for Phenergan and Hydrocodone sent to [facility] for Patient to pick up.  Patient is accompanied by son and discharged in stable condition.

## 2012-04-05 DIAGNOSIS — R262 Difficulty in walking, not elsewhere classified: Secondary | ICD-10-CM | POA: Diagnosis not present

## 2012-04-05 DIAGNOSIS — S8290XD Unspecified fracture of unspecified lower leg, subsequent encounter for closed fracture with routine healing: Secondary | ICD-10-CM | POA: Diagnosis not present

## 2012-04-05 DIAGNOSIS — I10 Essential (primary) hypertension: Secondary | ICD-10-CM | POA: Diagnosis not present

## 2012-04-05 DIAGNOSIS — IMO0001 Reserved for inherently not codable concepts without codable children: Secondary | ICD-10-CM | POA: Diagnosis not present

## 2012-04-06 ENCOUNTER — Telehealth: Payer: Self-pay | Admitting: Orthopedic Surgery

## 2012-04-06 DIAGNOSIS — S329XXA Fracture of unspecified parts of lumbosacral spine and pelvis, initial encounter for closed fracture: Secondary | ICD-10-CM

## 2012-04-06 NOTE — Telephone Encounter (Signed)
Call received from [facility] per [facility], physical therapist (direct cell# [phone]), requesting order for wheelchair.  She states she had seen the patient at home (said patient was discharged from [facility] 04/05/12), and that the patient is having difficulty with non-weight bearing status.  She said patient is presently wearing an immobilizer and is using a rolling walker, and has ambulated from bed to bathroom only.  Physical therapist asked for order for:  Light-weight, Narrow, wheelchair with Elevating leg rest.    Please fax order/Rx to [facility] at Fax [phone], with "Attention:  [place] location."  Their ph# is [phone].

## 2012-04-06 NOTE — Telephone Encounter (Signed)
Order sent.

## 2012-04-07 DIAGNOSIS — I10 Essential (primary) hypertension: Secondary | ICD-10-CM | POA: Diagnosis not present

## 2012-04-07 DIAGNOSIS — R262 Difficulty in walking, not elsewhere classified: Secondary | ICD-10-CM | POA: Diagnosis not present

## 2012-04-07 DIAGNOSIS — S8290XD Unspecified fracture of unspecified lower leg, subsequent encounter for closed fracture with routine healing: Secondary | ICD-10-CM | POA: Diagnosis not present

## 2012-04-07 DIAGNOSIS — IMO0001 Reserved for inherently not codable concepts without codable children: Secondary | ICD-10-CM | POA: Diagnosis not present

## 2012-04-08 DIAGNOSIS — R262 Difficulty in walking, not elsewhere classified: Secondary | ICD-10-CM | POA: Diagnosis not present

## 2012-04-08 DIAGNOSIS — I10 Essential (primary) hypertension: Secondary | ICD-10-CM | POA: Diagnosis not present

## 2012-04-08 DIAGNOSIS — S8290XD Unspecified fracture of unspecified lower leg, subsequent encounter for closed fracture with routine healing: Secondary | ICD-10-CM | POA: Diagnosis not present

## 2012-04-08 DIAGNOSIS — IMO0001 Reserved for inherently not codable concepts without codable children: Secondary | ICD-10-CM | POA: Diagnosis not present

## 2012-04-09 DIAGNOSIS — IMO0001 Reserved for inherently not codable concepts without codable children: Secondary | ICD-10-CM | POA: Diagnosis not present

## 2012-04-09 DIAGNOSIS — R262 Difficulty in walking, not elsewhere classified: Secondary | ICD-10-CM | POA: Diagnosis not present

## 2012-04-09 DIAGNOSIS — I10 Essential (primary) hypertension: Secondary | ICD-10-CM | POA: Diagnosis not present

## 2012-04-09 DIAGNOSIS — S8290XD Unspecified fracture of unspecified lower leg, subsequent encounter for closed fracture with routine healing: Secondary | ICD-10-CM | POA: Diagnosis not present

## 2012-04-10 DIAGNOSIS — S8290XD Unspecified fracture of unspecified lower leg, subsequent encounter for closed fracture with routine healing: Secondary | ICD-10-CM | POA: Diagnosis not present

## 2012-04-10 DIAGNOSIS — IMO0001 Reserved for inherently not codable concepts without codable children: Secondary | ICD-10-CM | POA: Diagnosis not present

## 2012-04-10 DIAGNOSIS — R262 Difficulty in walking, not elsewhere classified: Secondary | ICD-10-CM | POA: Diagnosis not present

## 2012-04-10 DIAGNOSIS — I10 Essential (primary) hypertension: Secondary | ICD-10-CM | POA: Diagnosis not present

## 2012-04-13 DIAGNOSIS — R262 Difficulty in walking, not elsewhere classified: Secondary | ICD-10-CM | POA: Diagnosis not present

## 2012-04-13 DIAGNOSIS — S8290XD Unspecified fracture of unspecified lower leg, subsequent encounter for closed fracture with routine healing: Secondary | ICD-10-CM | POA: Diagnosis not present

## 2012-04-13 DIAGNOSIS — I10 Essential (primary) hypertension: Secondary | ICD-10-CM | POA: Diagnosis not present

## 2012-04-13 DIAGNOSIS — IMO0001 Reserved for inherently not codable concepts without codable children: Secondary | ICD-10-CM | POA: Diagnosis not present

## 2012-04-14 DIAGNOSIS — S8290XD Unspecified fracture of unspecified lower leg, subsequent encounter for closed fracture with routine healing: Secondary | ICD-10-CM | POA: Diagnosis not present

## 2012-04-14 DIAGNOSIS — IMO0001 Reserved for inherently not codable concepts without codable children: Secondary | ICD-10-CM | POA: Diagnosis not present

## 2012-04-14 DIAGNOSIS — R262 Difficulty in walking, not elsewhere classified: Secondary | ICD-10-CM | POA: Diagnosis not present

## 2012-04-14 DIAGNOSIS — I10 Essential (primary) hypertension: Secondary | ICD-10-CM | POA: Diagnosis not present

## 2012-04-15 DIAGNOSIS — S8290XD Unspecified fracture of unspecified lower leg, subsequent encounter for closed fracture with routine healing: Secondary | ICD-10-CM | POA: Diagnosis not present

## 2012-04-15 DIAGNOSIS — I10 Essential (primary) hypertension: Secondary | ICD-10-CM | POA: Diagnosis not present

## 2012-04-15 DIAGNOSIS — R262 Difficulty in walking, not elsewhere classified: Secondary | ICD-10-CM | POA: Diagnosis not present

## 2012-04-15 DIAGNOSIS — IMO0001 Reserved for inherently not codable concepts without codable children: Secondary | ICD-10-CM | POA: Diagnosis not present

## 2012-04-16 ENCOUNTER — Ambulatory Visit (HOSPITAL_COMMUNITY)
Admission: RE | Admit: 2012-04-16 | Discharge: 2012-04-16 | Disposition: A | Discharge status: Home / Self Care | Payer: Medicare Other | Source: Ambulatory Visit | Attending: Orthopedic Surgery | Admitting: Orthopedic Surgery

## 2012-04-16 ENCOUNTER — Ambulatory Visit (INDEPENDENT_AMBULATORY_CARE_PROVIDER_SITE_OTHER): Payer: Medicare Other | Admitting: Orthopedic Surgery

## 2012-04-16 ENCOUNTER — Encounter: Payer: Self-pay | Admitting: Orthopedic Surgery

## 2012-04-16 ENCOUNTER — Other Ambulatory Visit: Payer: Self-pay | Admitting: Orthopedic Surgery

## 2012-04-16 VITALS — BP 116/70 | Ht 61.0 in | Wt 100.0 lb

## 2012-04-16 DIAGNOSIS — M25469 Effusion, unspecified knee: Secondary | ICD-10-CM | POA: Diagnosis not present

## 2012-04-16 DIAGNOSIS — Z4789 Encounter for other orthopedic aftercare: Secondary | ICD-10-CM | POA: Insufficient documentation

## 2012-04-16 DIAGNOSIS — S82009A Unspecified fracture of unspecified patella, initial encounter for closed fracture: Secondary | ICD-10-CM

## 2012-04-16 DIAGNOSIS — S82002A Unspecified fracture of left patella, initial encounter for closed fracture: Secondary | ICD-10-CM

## 2012-04-16 NOTE — Progress Notes (Signed)
Patient ID: Felicia Harvey, female   DOB: 1929/03/11, 78 y.o.   MRN: [id number] Chief Complaint  Patient presents with  . Follow-up    hospital follow up, LT knee, DOI 04/01/12   Left knee patella fracture 14 weeks old x-ray [facility]  X-ray shows fracture nondisplaced  Continue knee immobilizer for additional weeks then placed in a hinged knee brace and start physical therapy for range of motion exercises  Weightbearing as tolerated.

## 2012-04-16 NOTE — Patient Instructions (Signed)
FOR THERAPIST: Weight bearing as tolerated in brace with walker

## 2012-04-17 DIAGNOSIS — S8290XD Unspecified fracture of unspecified lower leg, subsequent encounter for closed fracture with routine healing: Secondary | ICD-10-CM | POA: Diagnosis not present

## 2012-04-17 DIAGNOSIS — IMO0001 Reserved for inherently not codable concepts without codable children: Secondary | ICD-10-CM | POA: Diagnosis not present

## 2012-04-17 DIAGNOSIS — I10 Essential (primary) hypertension: Secondary | ICD-10-CM | POA: Diagnosis not present

## 2012-04-17 DIAGNOSIS — R262 Difficulty in walking, not elsewhere classified: Secondary | ICD-10-CM | POA: Diagnosis not present

## 2012-04-20 DIAGNOSIS — I10 Essential (primary) hypertension: Secondary | ICD-10-CM | POA: Diagnosis not present

## 2012-04-20 DIAGNOSIS — IMO0001 Reserved for inherently not codable concepts without codable children: Secondary | ICD-10-CM | POA: Diagnosis not present

## 2012-04-20 DIAGNOSIS — R262 Difficulty in walking, not elsewhere classified: Secondary | ICD-10-CM | POA: Diagnosis not present

## 2012-04-20 DIAGNOSIS — S8290XD Unspecified fracture of unspecified lower leg, subsequent encounter for closed fracture with routine healing: Secondary | ICD-10-CM | POA: Diagnosis not present

## 2012-04-23 DIAGNOSIS — IMO0001 Reserved for inherently not codable concepts without codable children: Secondary | ICD-10-CM | POA: Diagnosis not present

## 2012-04-23 DIAGNOSIS — S8290XD Unspecified fracture of unspecified lower leg, subsequent encounter for closed fracture with routine healing: Secondary | ICD-10-CM | POA: Diagnosis not present

## 2012-04-23 DIAGNOSIS — R262 Difficulty in walking, not elsewhere classified: Secondary | ICD-10-CM | POA: Diagnosis not present

## 2012-04-23 DIAGNOSIS — I10 Essential (primary) hypertension: Secondary | ICD-10-CM | POA: Diagnosis not present

## 2012-04-24 ENCOUNTER — Telehealth: Payer: Self-pay | Admitting: *Deleted

## 2012-04-24 NOTE — Telephone Encounter (Signed)
REFILL 

## 2012-04-24 NOTE — Telephone Encounter (Signed)
Faxed Dr. Mort Sawyers signed refill authorization to [facility], attention: Harrold Donath, pharmacist.  Patient aware.

## 2012-05-08 ENCOUNTER — Encounter: Payer: Self-pay | Admitting: Internal Medicine

## 2012-05-20 ENCOUNTER — Ambulatory Visit (INDEPENDENT_AMBULATORY_CARE_PROVIDER_SITE_OTHER): Payer: Medicare Other

## 2012-05-20 ENCOUNTER — Other Ambulatory Visit: Payer: Self-pay

## 2012-05-20 ENCOUNTER — Ambulatory Visit (INDEPENDENT_AMBULATORY_CARE_PROVIDER_SITE_OTHER): Payer: Medicare Other | Admitting: Orthopedic Surgery

## 2012-05-20 ENCOUNTER — Encounter: Payer: Self-pay | Admitting: Orthopedic Surgery

## 2012-05-20 VITALS — BP 140/90 | Ht 61.0 in | Wt 100.0 lb

## 2012-05-20 DIAGNOSIS — S82009A Unspecified fracture of unspecified patella, initial encounter for closed fracture: Secondary | ICD-10-CM

## 2012-05-20 NOTE — Progress Notes (Signed)
Patient ID: Felicia Harvey, female   DOB: [birth year], 78 y.o.   MRN: [id number] Chief Complaint  Patient presents with  . Knee Injury    doi 4.24.2013 patella fracture     BP 140/90  Ht 5\' 1"  (1.549 m)  Wt 100 lb (45.36 kg)  BMI 18.89 kg/m2  LEFT patella fracture, nondisplaced, treated with a brace. She is walking well wishing to get the brace off as a cane.  Her range of motion no extensor lag. X-ray shows healing of the fracture.  Followup as needed

## 2012-05-20 NOTE — Patient Instructions (Addendum)
activities as tolerated 

## 2012-06-03 DIAGNOSIS — M810 Age-related osteoporosis without current pathological fracture: Secondary | ICD-10-CM | POA: Diagnosis not present

## 2012-06-03 DIAGNOSIS — I10 Essential (primary) hypertension: Secondary | ICD-10-CM | POA: Diagnosis not present

## 2012-06-03 DIAGNOSIS — K219 Gastro-esophageal reflux disease without esophagitis: Secondary | ICD-10-CM | POA: Diagnosis not present

## 2012-06-03 DIAGNOSIS — E039 Hypothyroidism, unspecified: Secondary | ICD-10-CM | POA: Diagnosis not present

## 2012-06-05 ENCOUNTER — Encounter: Payer: Self-pay | Admitting: Urgent Care

## 2012-06-05 ENCOUNTER — Ambulatory Visit (INDEPENDENT_AMBULATORY_CARE_PROVIDER_SITE_OTHER): Payer: Medicare Other | Admitting: Urgent Care

## 2012-06-05 VITALS — BP 114/65 | HR 57 | Temp 97.2°F | Ht 62.0 in | Wt 97.2 lb

## 2012-06-05 DIAGNOSIS — K900 Celiac disease: Secondary | ICD-10-CM

## 2012-06-05 DIAGNOSIS — I498 Other specified cardiac arrhythmias: Secondary | ICD-10-CM | POA: Diagnosis not present

## 2012-06-05 DIAGNOSIS — R1310 Dysphagia, unspecified: Secondary | ICD-10-CM

## 2012-06-05 DIAGNOSIS — K219 Gastro-esophageal reflux disease without esophagitis: Secondary | ICD-10-CM | POA: Diagnosis not present

## 2012-06-05 DIAGNOSIS — R001 Bradycardia, unspecified: Secondary | ICD-10-CM | POA: Insufficient documentation

## 2012-06-05 MED ORDER — OMEPRAZOLE 20 MG PO CPDR: 20.0000 mg | DELAYED_RELEASE_CAPSULE | Freq: Every day | ORAL | Status: DC

## 2012-06-05 NOTE — Patient Instructions (Addendum)
Your thyroid is abnormal and this may be making her heart rate a bit slower. I spoke with Dr. Margo Aye today and he should be calling you very soon to adjust her medications.  If you do not hear from him please call him. Continue your Prilosec (omeprazole) 20 mg daily for your acid reflux If you have chest pain, trouble breathing, or dizziness, you should seek emergency care as soon as possible Office visit in one year or sooner if needed

## 2012-06-05 NOTE — Assessment & Plan Note (Signed)
Resolved

## 2012-06-05 NOTE — Progress Notes (Signed)
Faxed to PCP

## 2012-06-05 NOTE — Assessment & Plan Note (Signed)
Well controlled on PPI.  Continue your Prilosec (omeprazole) 20 mg daily for your acid reflux Office visit in one year or sooner if needed

## 2012-06-05 NOTE — Assessment & Plan Note (Signed)
Felicia Harvey is a pleasant 78 y.o. female with history of celiac disease doing well on gluten-free diet. She denies any GI complaints at this time.

## 2012-06-05 NOTE — Progress Notes (Signed)
Primary Care Physician:  Dwana Melena, MD Primary Gastroenterologist:  Dr. Jena Gauss  Chief Complaint  Patient presents with  . Follow-up    has some occasional difficulty with swallowing solid foods    HPI:  Felicia Harvey is a 78 y.o. female here for follow up for GERD, dysphagia and celiac disease. Overall she is doing very well. She is on gluten-free diet. She does take omeprazole 20 mg daily. She tells me she fractured her left knee & has had some fatigue.  Today it is noted that her pulse is in the 40s and 50s. She denies any significant chest pain, shortness of breath. She does have occasional transient dizziness, but has not had any recent falls. Denies any upper GI symptoms including heartburn, indigestion, nausea, vomiting, dysphagia, odynophagia or anorexia. Denies any lower GI symptoms including constipation, diarrhea, rectal bleeding, melena or weight loss.   Past Medical History  Diagnosis Date  . Thyroid disease   . HTN (hypertension)   . Reflux   . Anxiety   . Osteopenia   . GERD (gastroesophageal reflux disease)   . Hypothyroidism   . Dysphagia 09/21/2011  . Chest pain 09/21/2011  . Schizophrenia   . Polydipsia 09/21/2011  . 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 Celiac disease   . Knee fracture, left 03/2012    Past Surgical History  Procedure Date  . Left shoulder   . Thyroidectomy   . Vertebroplasty   . Left hip replacement   . Esophagogastroduodenoscopy 09/27/2011    Inflammatory changes midesophagus most likely pill-induced injury (Fosamax), large diaphragmatic hernia, biopsies from small bowel consistent with celiac disease    Current Outpatient Prescriptions  Medication Sig Dispense Refill  . acetaminophen (TYLENOL) 325 MG tablet Take 650 mg by mouth every 6 (six) hours as needed. For pain    Very seldom      . alendronate (FOSAMAX) 70 MG tablet Take 70 mg by mouth every 7 (seven) days. On Fridays of each weekTake with a full glass of water on an empty  stomach. Fridays      . HYDROCODONE-ACETAMINOPHEN PO Take by mouth. 1/2 pill occassionally for her knee pain      . levothyroxine (SYNTHROID, LEVOTHROID) 50 MCG tablet Take 125 mcg by mouth every morning. Takes 125 mcg   1/2 tablet daily      . lisinopril (PRINIVIL,ZESTRIL) 40 MG tablet Take 40 mg by mouth every morning.       Marland Kitchen omeprazole (PRILOSEC) 20 MG capsule Take 1 capsule (20 mg total) by mouth daily.  31 capsule  11  . QUEtiapine (SEROQUEL) 100 MG tablet Take 100 mg by mouth at bedtime.        . promethazine (PHENERGAN) 12.5 MG tablet Take 1 tablet (12.5 mg total) by mouth every 6 (six) hours as needed for nausea.  30 tablet  0  . DISCONTD: NEXIUM 40 MG capsule         Allergies as of 06/05/2012 - Review Complete 06/05/2012  Allergen Reaction Noted  . Gluten  09/20/2011    Review of Systems: Gen: Denies any fever, chills, sweats, anorexia, fatigue, weakness, malaise, weight loss, and sleep disorder CV: Denies chest pain, angina, palpitations, syncope, orthopnea, PND, peripheral edema, and claudication. Resp: Denies dyspnea at rest, dyspnea with exercise, cough, sputum, wheezing, coughing up blood, and pleurisy. GI: Denies vomiting blood, jaundice, and fecal incontinence.   Denies dysphagia or odynophagia. Derm: Denies rash, itching, dry skin, hives, moles, warts, or unhealing ulcers.  Psych: Denies depression,  anxiety, memory loss, suicidal ideation, hallucinations, paranoia, and confusion. Heme: Denies bruising, bleeding, and enlarged lymph nodes.  Physical Exam: BP 114/65  Pulse 57  Temp 97.2 F (36.2 C) (Tympanic)  Ht 5\' 2"  (1.575 m)  Wt 97 lb 3.2 oz (44.09 kg)  BMI 17.78 kg/m2 General:   Alert,  Well-developed, well-nourished, pleasant and cooperative in NAD. Accompanied by her son today. Head:  Normocephalic and atraumatic. Eyes:  Sclera clear, no icterus.   Conjunctiva pink. Mouth:  No deformity or lesions, oropharynx pink and moist. Neck:  Supple; no masses or  thyromegaly. Heart:  Regular rate and rhythm; no murmurs, clicks, rubs,  or gallops. Abdomen:  Soft, nontender and nondistended. No masses, hepatosplenomegaly or hernias noted. Normal bowel sounds, without guarding, and without rebound.   Msk:  Symmetrical without gross deformities. Kyphosis. Pulses:  Normal pulses noted. Extremities:  Without clubbing or edema. Neurologic:  Alert and  oriented x4;  grossly normal neurologically. Skin:  Intact without significant lesions or rashes. Cervical Nodes:  No significant cervical adenopathy. Psych:  Alert and cooperative. Normal mood and affect.

## 2012-06-05 NOTE — Assessment & Plan Note (Addendum)
Heart rate 40s to 50s today. I contacted Dr. Margo Aye, her PCP, who discussed this with him as she is not on any medications that would cause this. Her lab work from Wednesday showed an abnormal TSH and he is going to contact her and followup with this to adjust her thyroid medicine and her bradycardia.  If you have chest pain, trouble breathing, or dizziness, you should seek emergency care as soon as possible

## 2012-06-16 DIAGNOSIS — K469 Unspecified abdominal hernia without obstruction or gangrene: Secondary | ICD-10-CM | POA: Diagnosis not present

## 2012-07-17 DIAGNOSIS — I10 Essential (primary) hypertension: Secondary | ICD-10-CM | POA: Diagnosis not present

## 2012-07-17 DIAGNOSIS — E039 Hypothyroidism, unspecified: Secondary | ICD-10-CM | POA: Diagnosis not present

## 2012-08-01 IMAGING — RF DG UGI W/ HIGH DENSITY W/KUB
14 of 24 series · 14 of 24 positions shown · non-contrast
Comparison: None.

CLINICAL DATA: Evaluate for hiatal and paraesophageal hernia,
abnormal EGD

UPPER GI SERIES WITH KUB
TECHNIQUE: Routine upper GI series was performed with thin and
high density barium.
Fluoroscopy Time: 4.2 minutes

[Series 1: run · 1 of 4 slices shown (1 of 14)]
[im 1/4]
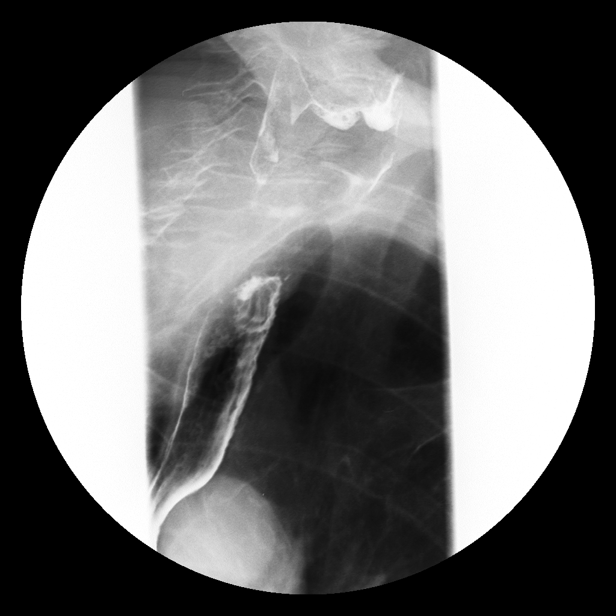

[Series 3: run · 1 of 2 slices shown (2 of 14)]
[im 1/2]
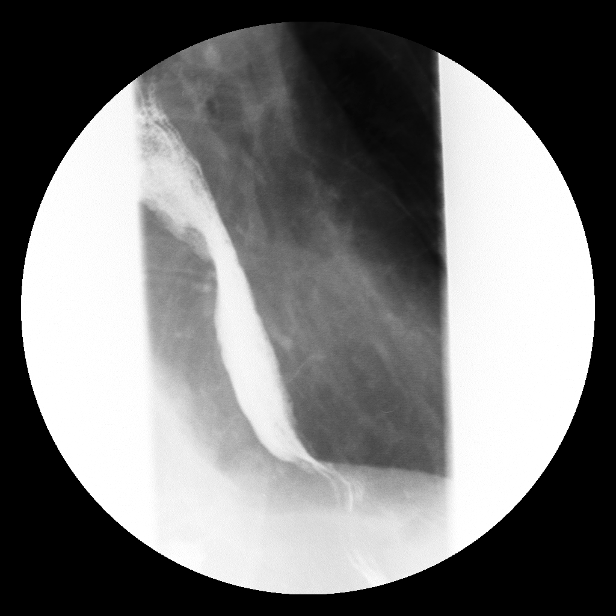

[Series 5: run · 1 of 2 slices shown (3 of 14)]
[im 1/2]
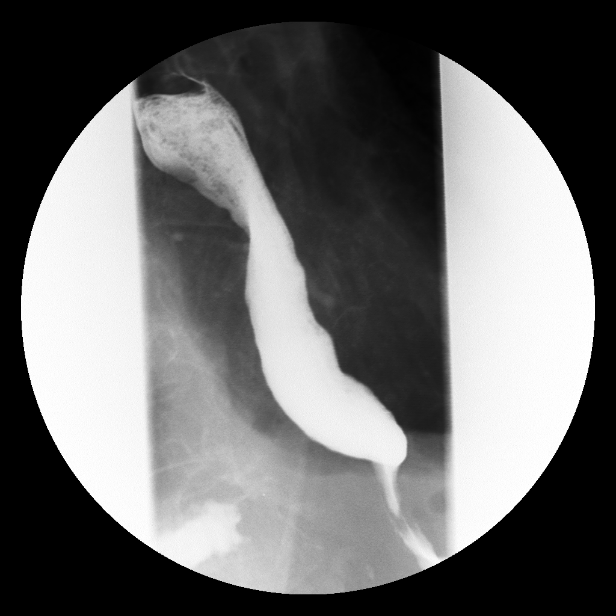

[Series 7: run · 1 of 1 slices shown (4 of 14)]
[im 1/1]
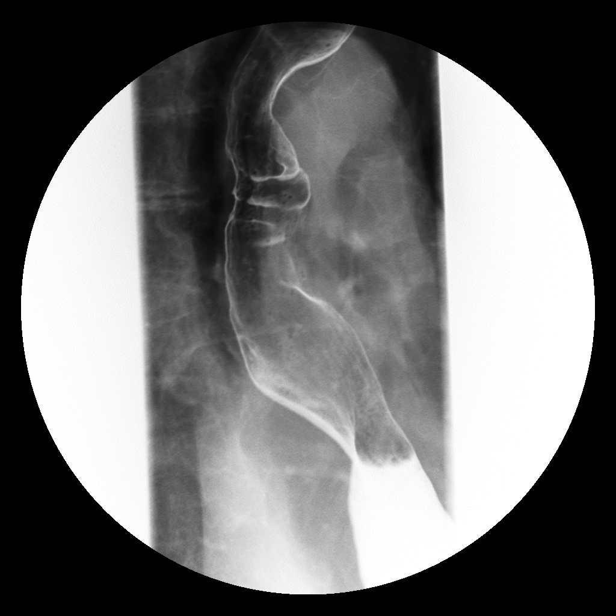

[Series 8: run · 1 of 2 slices shown (5 of 14)]
[im 1/2]
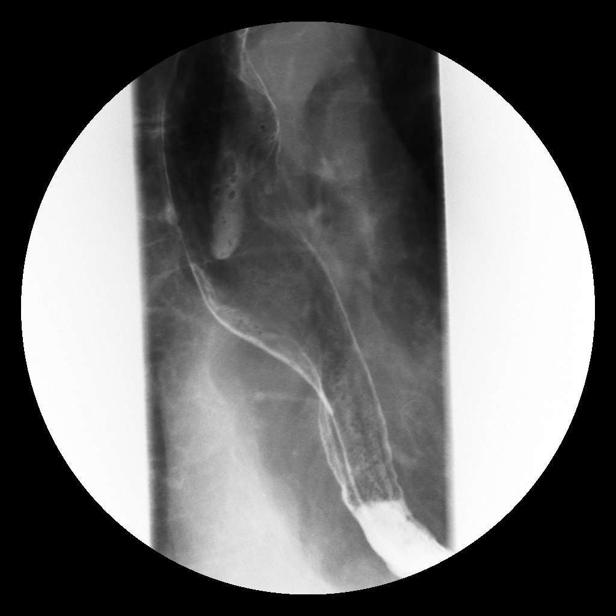

[Series 10: run · 1 of 2 slices shown (6 of 14)]
[im 1/2]
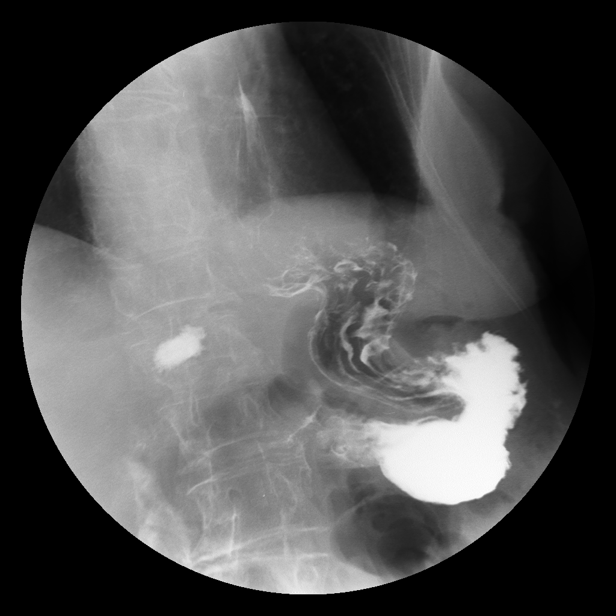

[Series 12: run · 1 of 1 slices shown (7 of 14)]
[im 1/1]
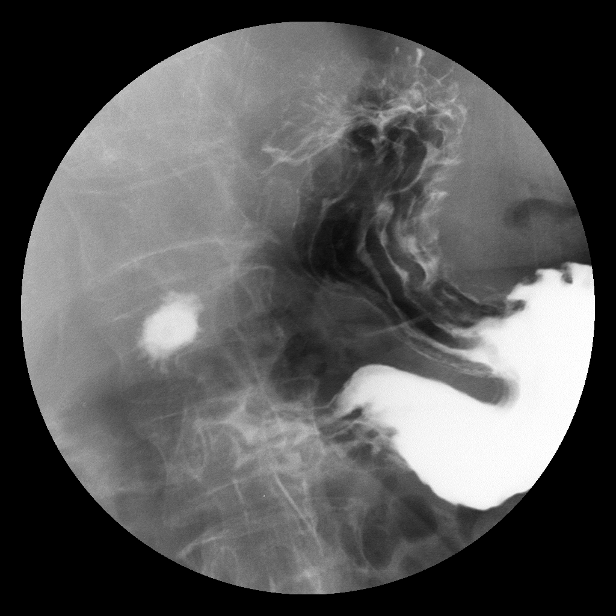

[Series 13: run · 1 of 1 slices shown (8 of 14)]
[im 1/1]
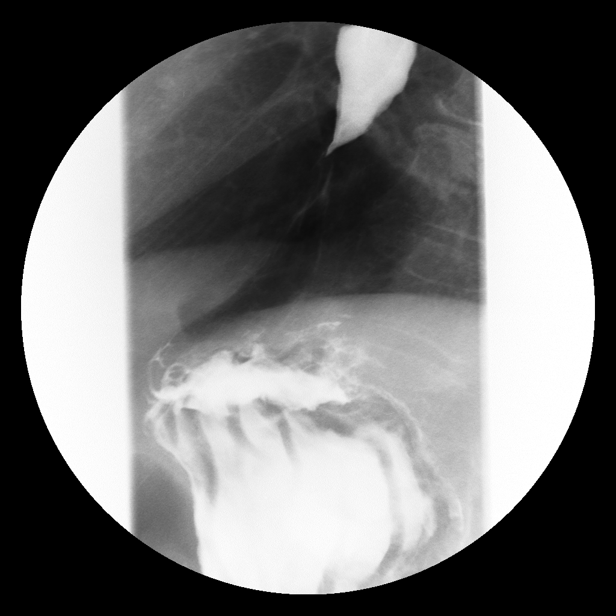

[Series 15: run · 1 of 1 slices shown (9 of 14)]
[im 1/1]
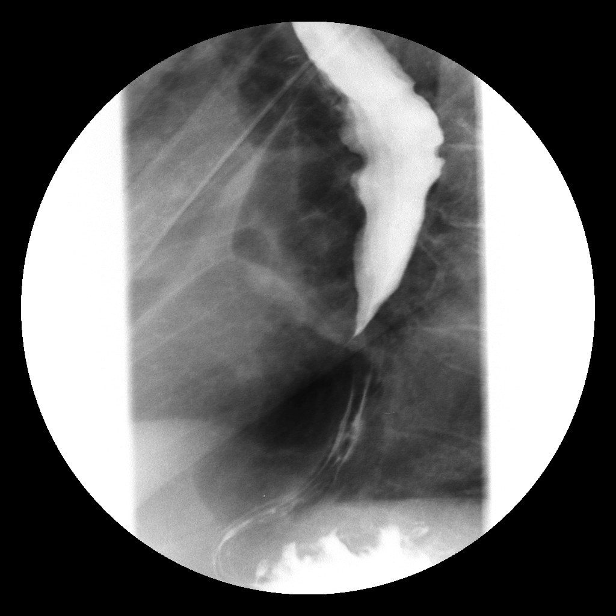

[Series 17: run · 1 of 1 slices shown (10 of 14)]
[im 1/1]
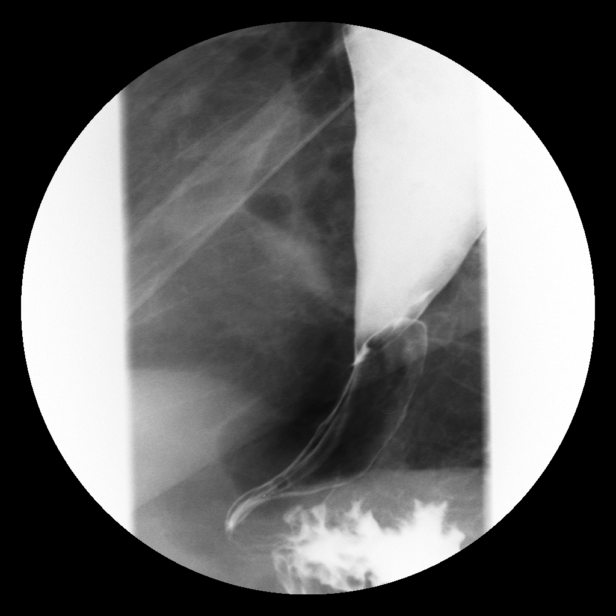

[Series 19: run · 1 of 1 slices shown (11 of 14)]
[im 1/1]
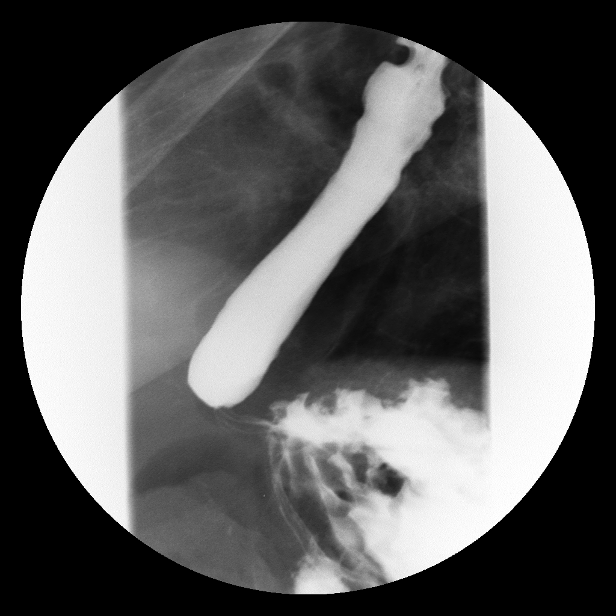

[Series 20: run · 1 of 1 slices shown (12 of 14)]
[im 1/1]
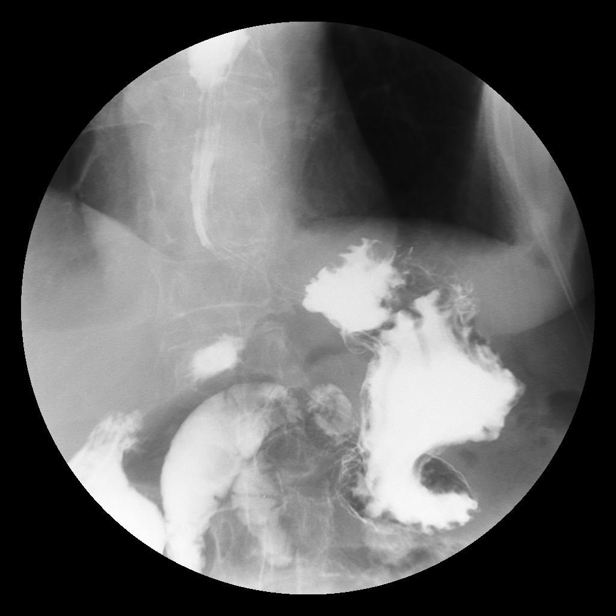

[Series 22: run · 1 of 1 slices shown (13 of 14)]
[im 1/1]
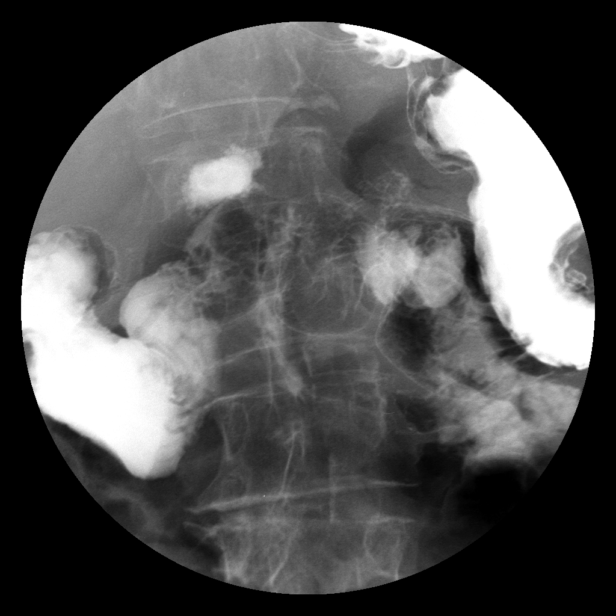

[Series 24: run · 1 of 1 slices shown (14 of 14)]
[im 1/1]
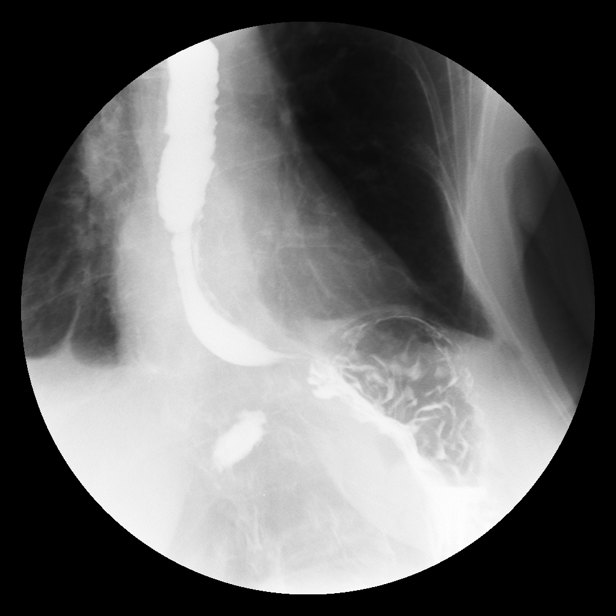

[14 of 24 positions shown; findings below may reference images not displayed]

FINDINGS: [facility] radiographs demonstrate prior upper lumbar
vertebral augmentation and a left hip arthroplasty.

Esophageal dysmotility with nonperistaltic/tertiary contractions
involving the mid to distal esophagus.

Distal esophagus is narrowed and notable for mucosal irregularity
on multiple images, but does intermittently distend appropriately,
suggesting esophagitis.

No fixed narrowing or esophageal stricture.  A 13 mm barium pill
successfully passed into the stomach.

No hiatal or paraesophageal hernia.  Normal position of the
pylorus/proximal duodenum in the right upper abdomen.
IMPRESSION: Esophageal dysmotility.

Distal esophagitis, without fixed narrowing or stricture.

No hiatal or paraesophageal hernia.

## 2012-11-11 ENCOUNTER — Ambulatory Visit (HOSPITAL_COMMUNITY)
Admission: RE | Admit: 2012-11-11 | Discharge: 2012-11-11 | Disposition: A | Discharge status: Home / Self Care | Payer: Medicare Other | Source: Ambulatory Visit | Attending: Internal Medicine | Admitting: Internal Medicine

## 2012-11-11 ENCOUNTER — Other Ambulatory Visit (HOSPITAL_COMMUNITY): Payer: Self-pay | Admitting: Internal Medicine

## 2012-11-11 DIAGNOSIS — M47817 Spondylosis without myelopathy or radiculopathy, lumbosacral region: Secondary | ICD-10-CM | POA: Diagnosis not present

## 2012-11-11 DIAGNOSIS — M545 Low back pain: Secondary | ICD-10-CM

## 2012-11-11 DIAGNOSIS — R0602 Shortness of breath: Secondary | ICD-10-CM | POA: Diagnosis not present

## 2012-11-11 DIAGNOSIS — M549 Dorsalgia, unspecified: Secondary | ICD-10-CM | POA: Insufficient documentation

## 2012-11-11 DIAGNOSIS — J438 Other emphysema: Secondary | ICD-10-CM | POA: Diagnosis not present

## 2012-11-11 DIAGNOSIS — R079 Chest pain, unspecified: Secondary | ICD-10-CM | POA: Diagnosis not present

## 2012-11-13 ENCOUNTER — Emergency Department (HOSPITAL_COMMUNITY)
Admission: EM | Admit: 2012-11-13 | Discharge: 2012-11-13 | Disposition: A | Discharge status: Home / Self Care | Payer: Medicare Other | Attending: Emergency Medicine | Admitting: Emergency Medicine

## 2012-11-13 ENCOUNTER — Emergency Department (HOSPITAL_COMMUNITY): Payer: Medicare Other

## 2012-11-13 ENCOUNTER — Encounter (HOSPITAL_COMMUNITY): Payer: Self-pay | Admitting: *Deleted

## 2012-11-13 DIAGNOSIS — R631 Polydipsia: Secondary | ICD-10-CM | POA: Diagnosis not present

## 2012-11-13 DIAGNOSIS — R1310 Dysphagia, unspecified: Secondary | ICD-10-CM | POA: Insufficient documentation

## 2012-11-13 DIAGNOSIS — K219 Gastro-esophageal reflux disease without esophagitis: Secondary | ICD-10-CM | POA: Insufficient documentation

## 2012-11-13 DIAGNOSIS — M899 Disorder of bone, unspecified: Secondary | ICD-10-CM | POA: Diagnosis not present

## 2012-11-13 DIAGNOSIS — I10 Essential (primary) hypertension: Secondary | ICD-10-CM | POA: Insufficient documentation

## 2012-11-13 DIAGNOSIS — F411 Generalized anxiety disorder: Secondary | ICD-10-CM | POA: Diagnosis not present

## 2012-11-13 DIAGNOSIS — R0789 Other chest pain: Secondary | ICD-10-CM | POA: Diagnosis not present

## 2012-11-13 DIAGNOSIS — F209 Schizophrenia, unspecified: Secondary | ICD-10-CM | POA: Diagnosis not present

## 2012-11-13 DIAGNOSIS — E079 Disorder of thyroid, unspecified: Secondary | ICD-10-CM | POA: Insufficient documentation

## 2012-11-13 DIAGNOSIS — K900 Celiac disease: Secondary | ICD-10-CM | POA: Insufficient documentation

## 2012-11-13 DIAGNOSIS — Z79899 Other long term (current) drug therapy: Secondary | ICD-10-CM | POA: Insufficient documentation

## 2012-11-13 DIAGNOSIS — R079 Chest pain, unspecified: Secondary | ICD-10-CM

## 2012-11-13 DIAGNOSIS — Z9889 Other specified postprocedural states: Secondary | ICD-10-CM | POA: Diagnosis not present

## 2012-11-13 LAB — BASIC METABOLIC PANEL
CO2: 23 mEq/L (ref 19–32)
GFR calc non Af Amer: 79 mL/min — ABNORMAL LOW (ref >90)
Glucose, Bld: 93 mg/dL (ref 70–99)
Potassium: 3.6 mEq/L (ref 3.5–5.1)
Sodium: 124 mEq/L — ABNORMAL LOW (ref 135–145)

## 2012-11-13 LAB — CBC WITH DIFFERENTIAL/PLATELET
Lymphocytes Relative: 35 % (ref 12–46)
Lymphs Abs: 1.7 10*3/uL (ref 0.7–4.0)
Neutro Abs: 2.6 10*3/uL (ref 1.7–7.7)
Neutrophils Relative %: 55 % (ref 43–77)
Platelets: 148 10*3/uL — ABNORMAL LOW (ref 150–400)
RBC: 4.31 MIL/uL (ref 3.87–5.11)
WBC: 4.7 10*3/uL (ref 4.0–10.5)

## 2012-11-13 NOTE — ED Provider Notes (Signed)
History     CSN: [id number]  Arrival date & time 11/13/12  0000   First MD Initiated Contact with Patient 11/13/12 0019      Chief Complaint  Patient presents with  . Chest Pain    (Consider location/radiation/quality/duration/timing/severity/associated sxs/prior treatment) HPI....fleeting sharp central chest pain at approximately 2300 last night.  No dyspnea, diaphoresis, nausea.  no previous heart attack.  patient feeling back to normal now.  Nothing makes symptoms better or worse. Severity is mild.  Past Medical History  Diagnosis Date  . Thyroid disease   . HTN (hypertension)   . Reflux   . Anxiety   . Osteopenia   . GERD (gastroesophageal reflux disease)   . Hypothyroidism   . Dysphagia 09/21/2011  . Chest pain 09/21/2011  . Schizophrenia   . Polydipsia 09/21/2011  . 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 Celiac disease   . Knee fracture, left 03/2012    Past Surgical History  Procedure Date  . Left shoulder   . Thyroidectomy   . Vertebroplasty   . Left hip replacement   . Esophagogastroduodenoscopy 09/27/2011    Inflammatory changes midesophagus most likely pill-induced injury (Fosamax), large diaphragmatic hernia, biopsies from small bowel consistent with celiac disease    Family History  Problem Relation Age of Onset  . Cancer    . Kidney disease      History  Substance Use Topics  . Smoking status: Never Smoker   . Smokeless tobacco: Not on file  . Alcohol Use: No     Comment: rarely    OB History    Grav Para Term Preterm Abortions TAB SAB Ect Mult Living                  Review of Systems  All other systems reviewed and are negative.    Allergies  Gluten  Home Medications   Current Outpatient Rx  Name  Route  Sig  Dispense  Refill  . DICLOFENAC SODIUM 50 MG PO TBEC   Oral   Take 50 mg by mouth once.         . ACETAMINOPHEN 325 MG PO TABS   Oral   Take 650 mg by mouth every 6 (six) hours as needed. For pain    Very seldom          . ALENDRONATE SODIUM 70 MG PO TABS   Oral   Take 70 mg by mouth every 7 (seven) days. On Fridays of each weekTake with a full glass of water on an empty stomach. Fridays         . HYDROCODONE-ACETAMINOPHEN PO   Oral   Take by mouth. 1/2 pill occassionally for her knee pain         . LEVOTHYROXINE SODIUM 50 MCG PO TABS   Oral   Take 75 mcg by mouth every morning. Takes 125 mcg   1/2 tablet daily         . LISINOPRIL 40 MG PO TABS   Oral   Take 40 mg by mouth every morning.          Marland Kitchen OMEPRAZOLE 20 MG PO CPDR   Oral   Take 1 capsule (20 mg total) by mouth daily.   31 capsule   11   . PROMETHAZINE HCL 12.5 MG PO TABS   Oral   Take 1 tablet (12.5 mg total) by mouth every 6 (six) hours as needed for nausea.   30 tablet   0   .  QUETIAPINE FUMARATE 100 MG PO TABS   Oral   Take 100 mg by mouth at bedtime.             BP 166/82  Pulse 76  Temp 97.5 F (36.4 C) (Oral)  Resp 16  Ht 5\' 3"  (1.6 m)  Wt 100 lb (45.36 kg)  BMI 17.71 kg/m2  SpO2 100%  Physical Exam  Nursing note and vitals reviewed. Constitutional: She is oriented to person, place, and time. She appears well-developed and well-nourished.  HENT:  Head: Normocephalic and atraumatic.  Eyes: Conjunctivae normal and EOM are normal. Pupils are equal, round, and reactive to light.  Neck: Normal range of motion. Neck supple.  Cardiovascular: Normal rate, regular rhythm and normal heart sounds.   Pulmonary/Chest: Effort normal and breath sounds normal.  Abdominal: Soft. Bowel sounds are normal.  Musculoskeletal: Normal range of motion.  Neurological: She is alert and oriented to person, place, and time.  Skin: Skin is warm and dry.  Psychiatric: She has a normal mood and affect.    ED Course  Procedures (including critical care time)  Labs Reviewed  CBC WITH DIFFERENTIAL - Abnormal; Notable for the following:    HCT 35.9 (*)     Platelets 148 (*)     All other components within normal  limits  BASIC METABOLIC PANEL  TROPONIN I   Dg Ribs Unilateral W/chest Right  11/11/2012  *RADIOLOGY REPORT*  Clinical Data: Shortness of breath.  RIGHT RIBS AND CHEST - 3+ VIEW  Comparison: 04/01/2012.  Findings: The cardiac silhouette, mediastinal and hilar contours are stable.  Stable emphysematous changes but no acute pulmonary findings.  Remote trauma of the right clavicle and ribs are noted.  Dedicated views of the right ribs demonstrate remote rib fractures. No obvious acute rib fracture.  IMPRESSION:  1.  Emphysematous changes but no acute pulmonary findings. 2.  Remote appearing right-sided rib fractures.  No definite acute fracture.   Original Report Authenticated By: Rudie Meyer, M.D.    Dg Lumbar Spine Complete  11/11/2012  *RADIOLOGY REPORT*  Clinical Data: Back pain.  LUMBAR SPINE - COMPLETE 4+ VIEW  Comparison: CT lumbar spine 10/02/2005.  Findings: Vertebral augmentation changes are noted at L1.  No acute lumbar compression fracture.  No significant degenerative changes. The visualized bony pelvis is intact.  There is moderate constipation and probable diffuse ileus.  IMPRESSION:  1.  Normal alignment and no acute bony findings.  Minimal degenerative changes for age. 2.  Vertebral augmentation changes at L1.   Original Report Authenticated By: Rudie Meyer, M.D.      No diagnosis found.   Date: 11/13/2012  Rate: 77  Rhythm: normal sinus rhythm  QRS Axis: normal  Intervals: normal  ST/T Wave abnormalities: ? TWI laterally  Conduction Disutrbances:first-degree A-V block   Narrative Interpretation:   Old EKG Reviewed: changes noted PAC's  MDM  Patient feeling back to normal. Screening test negative. Color is good.        Donnetta Hutching, MD 11/13/12 [phone]

## 2012-11-13 NOTE — ED Notes (Signed)
Pt reporting of burning type pain in middle of chest.  No SOB or nausea.  Pt also reports "feeling like things get stuck in esophagus."  No distress noted at present time.

## 2013-01-18 DIAGNOSIS — I10 Essential (primary) hypertension: Secondary | ICD-10-CM | POA: Diagnosis not present

## 2013-01-18 DIAGNOSIS — M810 Age-related osteoporosis without current pathological fracture: Secondary | ICD-10-CM | POA: Diagnosis not present

## 2013-01-18 DIAGNOSIS — E039 Hypothyroidism, unspecified: Secondary | ICD-10-CM | POA: Diagnosis not present

## 2013-01-18 DIAGNOSIS — M2550 Pain in unspecified joint: Secondary | ICD-10-CM | POA: Diagnosis not present

## 2013-04-24 ENCOUNTER — Emergency Department (HOSPITAL_COMMUNITY)
Admission: EM | Admit: 2013-04-24 | Discharge: 2013-04-24 | Disposition: A | Discharge status: Home / Self Care | Payer: Medicare Other | Attending: Emergency Medicine | Admitting: Emergency Medicine

## 2013-04-24 ENCOUNTER — Emergency Department (HOSPITAL_COMMUNITY): Payer: Medicare Other

## 2013-04-24 ENCOUNTER — Other Ambulatory Visit: Payer: Self-pay

## 2013-04-24 ENCOUNTER — Encounter (HOSPITAL_COMMUNITY): Payer: Self-pay

## 2013-04-24 DIAGNOSIS — M542 Cervicalgia: Secondary | ICD-10-CM | POA: Diagnosis not present

## 2013-04-24 DIAGNOSIS — R51 Headache: Secondary | ICD-10-CM | POA: Insufficient documentation

## 2013-04-24 DIAGNOSIS — Z9181 History of falling: Secondary | ICD-10-CM | POA: Diagnosis not present

## 2013-04-24 DIAGNOSIS — K219 Gastro-esophageal reflux disease without esophagitis: Secondary | ICD-10-CM | POA: Diagnosis not present

## 2013-04-24 DIAGNOSIS — F29 Unspecified psychosis not due to a substance or known physiological condition: Secondary | ICD-10-CM | POA: Diagnosis not present

## 2013-04-24 DIAGNOSIS — Z8719 Personal history of other diseases of the digestive system: Secondary | ICD-10-CM | POA: Insufficient documentation

## 2013-04-24 DIAGNOSIS — K4090 Unilateral inguinal hernia, without obstruction or gangrene, not specified as recurrent: Secondary | ICD-10-CM | POA: Insufficient documentation

## 2013-04-24 DIAGNOSIS — G459 Transient cerebral ischemic attack, unspecified: Secondary | ICD-10-CM | POA: Diagnosis not present

## 2013-04-24 DIAGNOSIS — Z8679 Personal history of other diseases of the circulatory system: Secondary | ICD-10-CM | POA: Insufficient documentation

## 2013-04-24 DIAGNOSIS — E039 Hypothyroidism, unspecified: Secondary | ICD-10-CM | POA: Diagnosis not present

## 2013-04-24 DIAGNOSIS — F411 Generalized anxiety disorder: Secondary | ICD-10-CM | POA: Diagnosis not present

## 2013-04-24 DIAGNOSIS — F209 Schizophrenia, unspecified: Secondary | ICD-10-CM | POA: Insufficient documentation

## 2013-04-24 DIAGNOSIS — I10 Essential (primary) hypertension: Secondary | ICD-10-CM | POA: Insufficient documentation

## 2013-04-24 DIAGNOSIS — J438 Other emphysema: Secondary | ICD-10-CM | POA: Diagnosis not present

## 2013-04-24 DIAGNOSIS — Z8781 Personal history of (healed) traumatic fracture: Secondary | ICD-10-CM | POA: Insufficient documentation

## 2013-04-24 DIAGNOSIS — R5381 Other malaise: Secondary | ICD-10-CM | POA: Insufficient documentation

## 2013-04-24 DIAGNOSIS — Z791 Long term (current) use of non-steroidal anti-inflammatories (NSAID): Secondary | ICD-10-CM | POA: Insufficient documentation

## 2013-04-24 DIAGNOSIS — Z79899 Other long term (current) drug therapy: Secondary | ICD-10-CM | POA: Insufficient documentation

## 2013-04-24 DIAGNOSIS — Z8739 Personal history of other diseases of the musculoskeletal system and connective tissue: Secondary | ICD-10-CM | POA: Diagnosis not present

## 2013-04-24 DIAGNOSIS — R109 Unspecified abdominal pain: Secondary | ICD-10-CM | POA: Diagnosis not present

## 2013-04-24 DIAGNOSIS — R209 Unspecified disturbances of skin sensation: Secondary | ICD-10-CM | POA: Diagnosis not present

## 2013-04-24 DIAGNOSIS — M79609 Pain in unspecified limb: Secondary | ICD-10-CM | POA: Diagnosis not present

## 2013-04-24 DIAGNOSIS — R4182 Altered mental status, unspecified: Secondary | ICD-10-CM | POA: Diagnosis not present

## 2013-04-24 LAB — COMPREHENSIVE METABOLIC PANEL
ALT: 11 U/L (ref 0–35)
Alkaline Phosphatase: 61 U/L (ref 39–117)
CO2: 25 mEq/L (ref 19–32)
Calcium: 8.3 mg/dL — ABNORMAL LOW (ref 8.4–10.5)
Chloride: 96 mEq/L (ref 96–112)
GFR calc Af Amer: 89 mL/min — ABNORMAL LOW (ref >90)
GFR calc non Af Amer: 77 mL/min — ABNORMAL LOW (ref >90)
Glucose, Bld: 91 mg/dL (ref 70–99)
Potassium: 3.5 mEq/L (ref 3.5–5.1)
Sodium: 130 mEq/L — ABNORMAL LOW (ref 135–145)
Total Bilirubin: 0.3 mg/dL (ref 0.3–1.2)

## 2013-04-24 LAB — CBC WITH DIFFERENTIAL/PLATELET
Eosinophils Relative: 0 % (ref 0–5)
Lymphocytes Relative: 37 % (ref 12–46)
Lymphs Abs: 1.6 10*3/uL (ref 0.7–4.0)
MCV: 82.9 fL (ref 78.0–100.0)
Platelets: 176 10*3/uL (ref 150–400)
RBC: 4.28 MIL/uL (ref 3.87–5.11)
WBC: 4.2 10*3/uL (ref 4.0–10.5)

## 2013-04-24 NOTE — ED Notes (Signed)
Pt ambulated to bathroom with minimal assistance. Pt back in bed resting at this time.

## 2013-04-24 NOTE — ED Notes (Signed)
Abbe Amsterdam in room speaking with pt and family

## 2013-04-24 NOTE — ED Provider Notes (Signed)
History  This chart was scribed for Geoffery Lyons, MD by Bennett Scrape, ED Scribe. This patient was seen in room [facility] and the patient's care was started at 8:51 pm.  CSN: [id number]  Arrival date & time 04/24/13  2021   First MD Initiated Contact with Patient 04/24/13 2041      Chief Complaint  Patient presents with  . Altered Mental Status  . Abdominal Pain  . Back Pain    The history is provided by the patient and a relative. No language interpreter was used.    HPI Comments: Felicia Harvey is a 79 y.o. female who presents to the Emergency Department complaining of 30-45 minutes of confusion with associated diffuse facial numbness this morning. Pt's son states the pt became confused when taking her pills and could not remember which pill to take which was unusual for her. Son states that the pt's awareness has since improved and she is currently at baseline.  Pt's son also reports that the pt's RLQ hernia has been causing her more pain than usual today. When asked if she has any pain the pt states that her head is "cracking", that her neck, back, and right leg hurts, and that her right arm has felt weak "for a while". She denies confusion stating that she can't hear that well and simply misunderstood what her son was saying. Pt's son denies pt has a h/o stroke of heart problems, and pt denies taking anticoagulants or aspirin. Pt has a h/o of falling and HTN. Pt is denies smoking or drinking.   Past Medical History  Diagnosis Date  . Thyroid disease   . HTN (hypertension)   . Reflux   . Anxiety   . Osteopenia   . GERD (gastroesophageal reflux disease)   . Hypothyroidism   . Dysphagia 09/21/2011  . Chest pain 09/21/2011  . Schizophrenia   . Polydipsia 09/21/2011  . 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 Celiac disease   . Knee fracture, left 03/2012    Past Surgical History  Procedure Laterality Date  . Left shoulder    . Thyroidectomy    . Vertebroplasty    . Left  hip replacement    . Esophagogastroduodenoscopy  09/27/2011    Inflammatory changes midesophagus most likely pill-induced injury (Fosamax), large diaphragmatic hernia, biopsies from small bowel consistent with celiac disease    Family History  Problem Relation Age of Onset  . Cancer    . Kidney disease      History  Substance Use Topics  . Smoking status: Never Smoker   . Smokeless tobacco: Not on file  . Alcohol Use: No     Comment: rarely    No OB history provided.  Review of Systems  Gastrointestinal: Positive for abdominal pain. Negative for vomiting and diarrhea.  Musculoskeletal: Positive for back pain and arthralgias. Negative for gait problem.  Neurological: Positive for weakness and numbness. Negative for speech difficulty.  Psychiatric/Behavioral: Positive for confusion.  All other systems reviewed and are negative.    Allergies  Gluten  Home Medications   Current Outpatient Rx  Name  Route  Sig  Dispense  Refill  . acetaminophen (TYLENOL) 325 MG tablet   Oral   Take 650 mg by mouth every 6 (six) hours as needed. For pain    Very seldom         . alendronate (FOSAMAX) 70 MG tablet   Oral   Take 70 mg by mouth every 7 (seven) days. On Fridays  of each weekTake with a full glass of water on an empty stomach. Fridays         . diclofenac (VOLTAREN) 50 MG EC tablet   Oral   Take 50 mg by mouth once.         Marland Kitchen HYDROCODONE-ACETAMINOPHEN PO   Oral   Take by mouth. 1/2 pill occassionally for her knee pain         . levothyroxine (SYNTHROID, LEVOTHROID) 50 MCG tablet   Oral   Take 75 mcg by mouth every morning. Takes 125 mcg   1/2 tablet daily         . lisinopril (PRINIVIL,ZESTRIL) 40 MG tablet   Oral   Take 40 mg by mouth every morning.          Marland Kitchen omeprazole (PRILOSEC) 20 MG capsule   Oral   Take 1 capsule (20 mg total) by mouth daily.   31 capsule   11   . EXPIRED: promethazine (PHENERGAN) 12.5 MG tablet   Oral   Take 1 tablet (12.5  mg total) by mouth every 6 (six) hours as needed for nausea.   30 tablet   0   . QUEtiapine (SEROQUEL) 100 MG tablet   Oral   Take 100 mg by mouth at bedtime.             Triage Vitals: BP 181/120  Pulse 74  Temp(Src) 96.8 F (36 C) (Oral)  Resp 16  SpO2 100%  Physical Exam  Nursing note and vitals reviewed. Constitutional: She is oriented to person, place, and time. She appears well-developed and well-nourished. No distress.  HENT:  Head: Normocephalic and atraumatic.  Right Ear: External ear normal.  Left Ear: External ear normal.  Nose: Nose normal.  Eyes: Conjunctivae and EOM are normal. Pupils are equal, round, and reactive to light. Right eye exhibits no discharge. Left eye exhibits no discharge. No scleral icterus.  Neck: Neck supple. No tracheal deviation present.  Cardiovascular: Normal rate, regular rhythm and normal heart sounds.   Pulmonary/Chest: Effort normal. No stridor. No respiratory distress. She has no wheezes. She has no rales.  Abdominal: Soft. Bowel sounds are normal. There is tenderness. There is no rebound and no guarding.  There is a small to medium sized right inguinal hernia that is not exquisitely tender to the touch. There is no rebound, and there is no guarding. Bowel sounds are normal.   Musculoskeletal: Normal range of motion. She exhibits no edema and no tenderness.  Neurological: She is alert and oriented to person, place, and time. She has normal strength. She displays normal reflexes. No cranial nerve deficit ( no gross defecits noted) or sensory deficit. She exhibits normal muscle tone. She displays no seizure activity. Coordination normal.  Skin: Skin is warm and dry. No rash noted.  Psychiatric: She has a normal mood and affect. Her behavior is normal.    ED Course  Procedures (including critical care time)  DIAGNOSTIC STUDIES: Oxygen Saturation is 100% on room air, normal by my interpretation.    COORDINATION OF CARE: 8:59 PM-  Discussed treatment with pt which includes x-rays of abdomen, an EKG, and a CAT scan of her head. Pt agrees with plan.   Labs Reviewed  CBC WITH DIFFERENTIAL  COMPREHENSIVE METABOLIC PANEL   No results found.   No diagnosis found.   Date: 04/24/2013  Rate: 75  Rhythm: normal sinus rhythm  QRS Axis: normal  Intervals: normal  ST/T Wave abnormalities: normal  Conduction Disutrbances:none  Narrative Interpretation:   Old EKG Reviewed: changes noted    MDM  Patient presents for evaluation of an episode of confusion that occurred this morning.  The workup is unremarkable including ct of the head, labs, and ekg.  The neurologic exam is non-focal and she is at her baseline.  I feel as though she is appropriate for discharge and follow up with her pcp.        I personally performed the services described in this documentation, which was scribed in my presence. The recorded information has been reviewed and is accurate.     Geoffery Lyons, MD 04/24/13 2306

## 2013-04-24 NOTE — ED Notes (Signed)
Dr Judd Lien in room assessing pt

## 2013-04-24 NOTE — ED Notes (Signed)
Dr Delo in room assessing pt 

## 2013-04-24 NOTE — ED Notes (Signed)
She was taking her pills this morning and became totally confused and then she got better per son. I downloaded this app about strokes and she had the symptoms. She later had facial numbness. I have been having problems with my back hurting and my neck cracking. Also I have been having problems with my hernia per pt.

## 2013-04-27 DIAGNOSIS — G459 Transient cerebral ischemic attack, unspecified: Secondary | ICD-10-CM | POA: Diagnosis not present

## 2013-04-27 DIAGNOSIS — I10 Essential (primary) hypertension: Secondary | ICD-10-CM | POA: Diagnosis not present

## 2013-05-05 DIAGNOSIS — I10 Essential (primary) hypertension: Secondary | ICD-10-CM | POA: Diagnosis not present

## 2013-06-08 DIAGNOSIS — K5900 Constipation, unspecified: Secondary | ICD-10-CM | POA: Diagnosis not present

## 2013-06-08 DIAGNOSIS — K469 Unspecified abdominal hernia without obstruction or gangrene: Secondary | ICD-10-CM | POA: Diagnosis not present

## 2013-06-17 ENCOUNTER — Other Ambulatory Visit: Payer: Self-pay | Admitting: Urgent Care

## 2013-06-22 ENCOUNTER — Encounter: Payer: Self-pay | Admitting: Internal Medicine

## 2013-06-22 ENCOUNTER — Ambulatory Visit (INDEPENDENT_AMBULATORY_CARE_PROVIDER_SITE_OTHER): Payer: Medicare Other | Admitting: Internal Medicine

## 2013-06-22 VITALS — BP 120/68 | HR 62 | Temp 98.2°F | Ht 60.0 in | Wt 103.6 lb

## 2013-06-22 DIAGNOSIS — R1314 Dysphagia, pharyngoesophageal phase: Secondary | ICD-10-CM

## 2013-06-22 DIAGNOSIS — R1310 Dysphagia, unspecified: Secondary | ICD-10-CM

## 2013-06-22 NOTE — Progress Notes (Signed)
Primary Care Physician:  HALL, ZACH, MD Primary Gastroenterologist:  Dr. Rourk  Pre-Procedure History & Physical: HPI:  Felicia Harvey is a 79 y.o. female here for followup of dysphagia. Patient notes intermittent pill dysphagia for past couple of years now having trouble with solid food or much more regular basis, perhaps 2-3 episodes weekly. No prior out and out food impaction, however. Continues to take Fosamax. I saw this lady in 2012 at which time she underwent EGD by me. Patient had inflammatory changes in the mid esophagus concerning for pill-induced injury. Also had a large diaphragmatic hernia with a questionable paraesophageal component. I ordered an upper GI series the patient never underwent this study. Typical reflux symptoms well controlled on Prilosec. History of celiac disease-patient states she's been compliant on a gluten-free diet. Apparently the colonoscopy. Previously lived in [place].  Also takes diclofenac  Past Medical History  Diagnosis Date  . Thyroid disease   . HTN (hypertension)   . Reflux   . Anxiety   . Osteopenia   . GERD (gastroesophageal reflux disease)   . Hypothyroidism   . Dysphagia 09/21/2011  . Chest pain 09/21/2011  . Schizophrenia   . Polydipsia 09/21/2011  . 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 Celiac disease   . Knee fracture, left 03/2012    Past Surgical History  Procedure Laterality Date  . Left shoulder    . Thyroidectomy    . Vertebroplasty    . Left hip replacement    . Esophagogastroduodenoscopy  09/27/2011    Inflammatory changes midesophagus most likely pill-induced injury (Fosamax), large diaphragmatic hernia, biopsies from small bowel consistent with celiac disease    Prior to Admission medications   Medication Sig Start Date End Date Taking? Authorizing Provider  acetaminophen (TYLENOL) 500 MG tablet Take 500 mg by mouth every 6 (six) hours as needed for pain.   Yes Historical Provider, MD  alendronate (FOSAMAX) 70 MG tablet  Take 70 mg by mouth every 7 (seven) days. On Fridays of each weekTake with a full glass of water on an empty stomach. Fridays   Yes Historical Provider, MD  aspirin 81 MG tablet Take 81 mg by mouth daily.   Yes Historical Provider, MD  diclofenac (VOLTAREN) 50 MG EC tablet Take 50 mg by mouth daily.    Yes Historical Provider, MD  levothyroxine (SYNTHROID, LEVOTHROID) 75 MCG tablet Take 75 mcg by mouth every morning.   Yes Historical Provider, MD  lisinopril (PRINIVIL,ZESTRIL) 40 MG tablet Take 40 mg by mouth every morning.    Yes Historical Provider, MD  omeprazole (PRILOSEC) 20 MG capsule TAKE 1 CAPSULE BY MOUTH DAILY. 06/17/13  Yes Anna W Sams, NP  QUEtiapine (SEROQUEL) 100 MG tablet Take 100 mg by mouth at bedtime.     Yes Historical Provider, MD    Allergies as of 06/22/2013 - Review Complete 06/22/2013  Allergen Reaction Noted  . Gluten  09/20/2011    Family History  Problem Relation Age of Onset  . Cancer    . Kidney disease      History   Social History  . Marital Status: Divorced    Spouse Name: N/A    Number of Children: N/A  . Years of Education: 12th   Occupational History  . Retired    Social History Main Topics  . Smoking status: Never Smoker   . Smokeless tobacco: Not on file  . Alcohol Use: No     Comment: rarely  . Drug Use: No  . Sexually   Active: Not on file   Other Topics Concern  . Not on file   Social History Narrative  . No narrative on file    Review of Systems: See HPI, otherwise negative ROS  Physical Exam: BP 120/68  Pulse 62  Temp(Src) 98.2 F (36.8 C) (Oral)  Ht 5' (1.524 m)  Wt 103 lb 9.6 oz (46.993 kg)  BMI 20.23 kg/m2 General:   Frail, elderly lady of [place] origin. Somewhat hard of hearing . Appears to be in no acute distress. Accompanied by her son. Skin:  Intact without significant lesions or rashes. Eyes:  Sclera clear, no icterus.   Conjunctiva pink. Ears:  Normal auditory acuity. Nose:  No deformity, discharge,  or  lesions. Mouth:  No deformity or lesions. Neck:  Supple; no masses or thyromegaly. No significant cervical adenopathy. Lungs:  Clear throughout to auscultation.   No wheezes, crackles, or rhonchi. No acute distress. Heart:  Regular rate and rhythm; no murmurs, clicks, rubs,  or gallops. Abdomen: Abdominal binder in place. Abdomen nondistended. Positive bowel sounds.  Soft and nontender without appreciable mass or hepatosplenomegaly.  Pulses:  Normal pulses noted. Extremities:  Without clubbing or edema.  Impression/Plan:  Pleasant 79-year-old lady with celiac disease now with worsening esophageal dysphagia. Takes diclofenac and Fosamax. Abnormal-appearing esophagus/upper GI tract at EGD 2 years ago. Further evaluation recommended but not done.  As a separate issue, no prior screening colonoscopy per report.  Recommendations:  Proceed with a barium pill esophagram/upper GI series to further evaluate the anatomy of her upper GI tract. Return a stool sample for occult blood testing. Further recommendations to follow. Continue Prilosec daily  

## 2013-06-22 NOTE — Patient Instructions (Addendum)
Barium pill esophogram and UGI series - dysphagia and question of para-esophageal hernia  Stool for occult blood testing  Further recommendations to follow

## 2013-06-23 ENCOUNTER — Ambulatory Visit (INDEPENDENT_AMBULATORY_CARE_PROVIDER_SITE_OTHER): Payer: Medicare Other | Admitting: Internal Medicine

## 2013-06-23 DIAGNOSIS — D649 Anemia, unspecified: Secondary | ICD-10-CM | POA: Diagnosis not present

## 2013-06-24 NOTE — Progress Notes (Signed)
Stool negative for occult blood-please let her know. Cc copy to PCP

## 2013-06-28 ENCOUNTER — Ambulatory Visit (HOSPITAL_COMMUNITY)
Admission: RE | Admit: 2013-06-28 | Discharge: 2013-06-28 | Disposition: A | Discharge status: Home / Self Care | Payer: Medicare Other | Source: Ambulatory Visit | Attending: Internal Medicine | Admitting: Internal Medicine

## 2013-06-28 DIAGNOSIS — K224 Dyskinesia of esophagus: Secondary | ICD-10-CM | POA: Insufficient documentation

## 2013-06-28 DIAGNOSIS — K225 Diverticulum of esophagus, acquired: Secondary | ICD-10-CM | POA: Diagnosis not present

## 2013-06-28 DIAGNOSIS — I10 Essential (primary) hypertension: Secondary | ICD-10-CM | POA: Diagnosis not present

## 2013-06-28 DIAGNOSIS — R1310 Dysphagia, unspecified: Secondary | ICD-10-CM | POA: Diagnosis not present

## 2013-06-28 DIAGNOSIS — Q409 Congenital malformation of upper alimentary tract, unspecified: Secondary | ICD-10-CM | POA: Diagnosis not present

## 2013-06-28 DIAGNOSIS — K222 Esophageal obstruction: Secondary | ICD-10-CM | POA: Insufficient documentation

## 2013-06-30 NOTE — Progress Notes (Signed)
Pt and son are aware. cc'd pcp

## 2013-07-06 ENCOUNTER — Encounter: Payer: Self-pay | Admitting: Pharmacy Technician

## 2013-07-06 ENCOUNTER — Encounter (HOSPITAL_COMMUNITY): Payer: Self-pay | Admitting: Pharmacy Technician

## 2013-07-06 ENCOUNTER — Other Ambulatory Visit: Payer: Self-pay | Admitting: Internal Medicine

## 2013-07-06 DIAGNOSIS — K222 Esophageal obstruction: Secondary | ICD-10-CM

## 2013-07-06 NOTE — Progress Notes (Signed)
Patient is scheduled for EGD/ED w/RMR on 8/5 and I have spoken to her and her son and instructions have been mailed

## 2013-07-08 DIAGNOSIS — E039 Hypothyroidism, unspecified: Secondary | ICD-10-CM | POA: Diagnosis not present

## 2013-07-08 DIAGNOSIS — M810 Age-related osteoporosis without current pathological fracture: Secondary | ICD-10-CM | POA: Diagnosis not present

## 2013-07-08 DIAGNOSIS — I10 Essential (primary) hypertension: Secondary | ICD-10-CM | POA: Diagnosis not present

## 2013-07-08 DIAGNOSIS — E785 Hyperlipidemia, unspecified: Secondary | ICD-10-CM | POA: Diagnosis not present

## 2013-07-12 MED ORDER — BUPIVACAINE-EPINEPHRINE PF 0.5-1:200000 % IJ SOLN
INTRAMUSCULAR | Status: AC
  Filled 2013-07-12: qty 20

## 2013-07-13 ENCOUNTER — Ambulatory Visit (HOSPITAL_COMMUNITY)
Admission: RE | Admit: 2013-07-13 | Discharge: 2013-07-13 | Disposition: A | Discharge status: Home / Self Care | Payer: Medicare Other | Source: Ambulatory Visit | Attending: Internal Medicine | Admitting: Internal Medicine

## 2013-07-13 ENCOUNTER — Encounter (HOSPITAL_COMMUNITY)
Admission: RE | Disposition: A | Discharge status: Home / Self Care | Payer: Self-pay | Source: Ambulatory Visit | Attending: Internal Medicine

## 2013-07-13 ENCOUNTER — Encounter (HOSPITAL_COMMUNITY): Payer: Self-pay | Admitting: *Deleted

## 2013-07-13 DIAGNOSIS — R1310 Dysphagia, unspecified: Secondary | ICD-10-CM | POA: Diagnosis not present

## 2013-07-13 DIAGNOSIS — R933 Abnormal findings on diagnostic imaging of other parts of digestive tract: Secondary | ICD-10-CM | POA: Diagnosis not present

## 2013-07-13 DIAGNOSIS — K222 Esophageal obstruction: Secondary | ICD-10-CM | POA: Insufficient documentation

## 2013-07-13 DIAGNOSIS — K209 Esophagitis, unspecified: Secondary | ICD-10-CM

## 2013-07-13 DIAGNOSIS — K449 Diaphragmatic hernia without obstruction or gangrene: Secondary | ICD-10-CM

## 2013-07-13 HISTORY — PX: ESOPHAGOGASTRODUODENOSCOPY (EGD) WITH ESOPHAGEAL DILATION: SHX5812

## 2013-07-13 HISTORY — DX: Cardiac arrhythmia, unspecified: I49.9

## 2013-07-13 SURGERY — ESOPHAGOGASTRODUODENOSCOPY (EGD) WITH ESOPHAGEAL DILATION
Anesthesia: Moderate Sedation

## 2013-07-13 MED ORDER — MEPERIDINE HCL 100 MG/ML IJ SOLN
INTRAMUSCULAR | Status: AC
  Filled 2013-07-13: qty 1

## 2013-07-13 MED ORDER — MEPERIDINE HCL 100 MG/ML IJ SOLN
INTRAMUSCULAR | Status: DC | PRN
  Administered 2013-07-13: 50 mg via INTRAVENOUS

## 2013-07-13 MED ORDER — ONDANSETRON HCL 4 MG/2ML IJ SOLN
INTRAMUSCULAR | Status: DC | PRN
  Administered 2013-07-13: 4 mg via INTRAVENOUS

## 2013-07-13 MED ORDER — MIDAZOLAM HCL 5 MG/5ML IJ SOLN
INTRAMUSCULAR | Status: DC | PRN
  Administered 2013-07-13: 2 mg via INTRAVENOUS

## 2013-07-13 MED ORDER — ONDANSETRON HCL 4 MG/2ML IJ SOLN
INTRAMUSCULAR | Status: AC
  Filled 2013-07-13: qty 2

## 2013-07-13 MED ORDER — BUTAMBEN-TETRACAINE-BENZOCAINE 2-2-14 % EX AERO
INHALATION_SPRAY | CUTANEOUS | Status: DC | PRN
  Administered 2013-07-13: 2 via TOPICAL

## 2013-07-13 MED ORDER — MIDAZOLAM HCL 5 MG/5ML IJ SOLN
INTRAMUSCULAR | Status: AC
  Filled 2013-07-13: qty 5

## 2013-07-13 MED ORDER — SODIUM CHLORIDE 0.9 % IV SOLN
INTRAVENOUS | Status: DC
  Administered 2013-07-13: 12:00:00 via INTRAVENOUS

## 2013-07-13 MED ORDER — STERILE WATER FOR IRRIGATION IR SOLN
Status: DC | PRN
  Administered 2013-07-13: 13:00:00

## 2013-07-13 NOTE — Interval H&P Note (Signed)
History and Physical Interval Note:  07/13/2013 1:04 PM  Felicia Harvey  has presented today for surgery, with the diagnosis of SCHATZKI'S RING  The various methods of treatment have been discussed with the patient and family. After consideration of risks, benefits and other options for treatment, the patient has consented to  Procedure(s) with comments: ESOPHAGOGASTRODUODENOSCOPY (EGD) WITH ESOPHAGEAL DILATION (N/A) - 1:00 as a surgical intervention .  The patient's history has been reviewed, patient examined, no change in status, stable for surgery.  I have reviewed the patient's chart and labs.  Questions were answered to the patient's satisfaction.     Brock Mokry  EGD with possible esophageal dilation per plan. Barium pill study abnormal.  The risks, benefits, limitations, alternatives and imponderables have been reviewed with the patient. Potential for esophageal dilation, biopsy, etc. have also been reviewed.  Questions have been answered. All parties agreeable.

## 2013-07-13 NOTE — Op Note (Signed)
[facility] [address] [place], [address]   ENDOSCOPY PROCEDURE REPORT  PATIENT: [name], [name]  MR#: [id number] BIRTHDATE: 05-28-1929 , 83  yrs. old GENDER: Female ENDOSCOPIST: R.  Roetta Sessions, MD FACP FACG REFERRED BY:  Catalina Pizza, M.D. PROCEDURE DATE:  07/13/2013 PROCEDURE:      EGD with Elease Hashimoto dilation, esophageal biopsy  INDICATIONS:       Esophageal dysphagia; Schatzki's ring seen on barium pill esophagram. Tiny Zenker's diverticulum.  INFORMED CONSENT:   The risks, benefits, limitations, alternatives and imponderables have been discussed.  The potential for biopsy, esophogeal dilation, etc. have also been reviewed.  Questions have been answered.  All parties agreeable.  Please see the history and physical in the medical record for more information.  MEDICATIONS:     Versed 2 mg IV and Demerol 50 mg IV. Zofran 4 mg IV. Cetacaine spray.  DESCRIPTION OF PROCEDURE:   The EG-[id number] ([id number])  endoscope was introduced through the mouth and advanced to the second portion of the duodenum without difficulty or limitations.  The mucosal surfaces were surveyed very carefully during advancement of the scope and upon withdrawal.  Retroflexion view of the proximal stomach and esophagogastric junction was performed.      FINDINGS: 4 mm area of excoriation in a linear distribution just above the GE junction. Please see above images. This appeared to be erosion versus a chemical injury. The tubular esophagus appeared patent throughout its course, however. I was unable to identify a Zenker's diverticulum. There was a noncritical Schatzki's ring present. Stomach empty. Small hiatal hernia. Abnormal gastric mucosa. Patent worse. Abnormal duodenal bulbar scalloping consistent with prior diagnosis celiac disease. The second portion of the duodenum appeared normal.  THERAPEUTIC / DIAGNOSTIC MANEUVERS PERFORMED:  A 54 French Maloney dilators passed to full  insertion easily. A look back revealed no apparent complication related to passage of the dilator. It Subsequently, the distal/abnormal appearing esophageal mucosa was biopsied for histologic study.   COMPLICATIONS:  None  IMPRESSION:     Distal esophagitis-more likely pill-induced - status post esophageal biopsy. Noncritical. Schatzki's ring-status post passage of a Maloney dilator. Hiatal hernia. Abnormal duodenum consistent with prior diagnosis of celiac disease.  RECOMMENDATIONS:  Continue Prilosec 20 mg daily-the benefits outweigh the risk.  I recommended that Fosamax be stopped and converted to a parenteral agent although many other medications including NSAIDs have also been associated with pill-induced esophageal injury.  Swallowing precautions reviewed with family members..  Followup on pathology.  _______________________________ R. Roetta Sessions, MD FACP [facility] eSigned:  R. Roetta Sessions, MD FACP [facility] 07/13/2013 1:37 PM     CC:  PATIENT NAME:  Felicia Harvey, Felicia Harvey MR#: [id number]

## 2013-07-13 NOTE — H&P (View-Only) (Signed)
Primary Care Physician:  Catalina Pizza, MD Primary Gastroenterologist:  Dr. Jena Gauss  Pre-Procedure History & Physical: HPI:  Felicia Harvey is a 79 y.o. female here for followup of dysphagia. Patient notes intermittent pill dysphagia for past couple of years now having trouble with solid food or much more regular basis, perhaps 2-3 episodes weekly. No prior out and out food impaction, however. Continues to take Fosamax. I saw this lady in 2012 at which time she underwent EGD by me. Patient had inflammatory changes in the mid esophagus concerning for pill-induced injury. Also had a large diaphragmatic hernia with a questionable paraesophageal component. I ordered an upper GI series the patient never underwent this study. Typical reflux symptoms well controlled on Prilosec. History of celiac disease-patient states she's been compliant on a gluten-free diet. Apparently the colonoscopy. Previously lived in [place].  Also takes diclofenac  Past Medical History  Diagnosis Date  . Thyroid disease   . HTN (hypertension)   . Reflux   . Anxiety   . Osteopenia   . GERD (gastroesophageal reflux disease)   . Hypothyroidism   . Dysphagia 09/21/2011  . Chest pain 09/21/2011  . Schizophrenia   . Polydipsia 09/21/2011  . 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 Celiac disease   . Knee fracture, left 03/2012    Past Surgical History  Procedure Laterality Date  . Left shoulder    . Thyroidectomy    . Vertebroplasty    . Left hip replacement    . Esophagogastroduodenoscopy  09/27/2011    Inflammatory changes midesophagus most likely pill-induced injury (Fosamax), large diaphragmatic hernia, biopsies from small bowel consistent with celiac disease    Prior to Admission medications   Medication Sig Start Date End Date Taking? Authorizing Provider  acetaminophen (TYLENOL) 500 MG tablet Take 500 mg by mouth every 6 (six) hours as needed for pain.   Yes Historical Provider, MD  alendronate (FOSAMAX) 70 MG tablet  Take 70 mg by mouth every 7 (seven) days. On Fridays of each weekTake with a full glass of water on an empty stomach. Fridays   Yes Historical Provider, MD  aspirin 81 MG tablet Take 81 mg by mouth daily.   Yes Historical Provider, MD  diclofenac (VOLTAREN) 50 MG EC tablet Take 50 mg by mouth daily.    Yes Historical Provider, MD  levothyroxine (SYNTHROID, LEVOTHROID) 75 MCG tablet Take 75 mcg by mouth every morning.   Yes Historical Provider, MD  lisinopril (PRINIVIL,ZESTRIL) 40 MG tablet Take 40 mg by mouth every morning.    Yes Historical Provider, MD  omeprazole (PRILOSEC) 20 MG capsule TAKE 1 CAPSULE BY MOUTH DAILY. 06/17/13  Yes Nira Retort, NP  QUEtiapine (SEROQUEL) 100 MG tablet Take 100 mg by mouth at bedtime.     Yes Historical Provider, MD    Allergies as of 06/22/2013 - Review Complete 06/22/2013  Allergen Reaction Noted  . Gluten  09/20/2011    Family History  Problem Relation Age of Onset  . Cancer    . Kidney disease      History   Social History  . Marital Status: Divorced    Spouse Name: N/A    Number of Children: N/A  . Years of Education: 12th   Occupational History  . Retired    Social History Main Topics  . Smoking status: Never Smoker   . Smokeless tobacco: Not on file  . Alcohol Use: No     Comment: rarely  . Drug Use: No  . Sexually  Active: Not on file   Other Topics Concern  . Not on file   Social History Narrative  . No narrative on file    Review of Systems: See HPI, otherwise negative ROS  Physical Exam: BP 120/68  Pulse 62  Temp(Src) 98.2 F (36.8 C) (Oral)  Ht 5' (1.524 m)  Wt 103 lb 9.6 oz (46.993 kg)  BMI 20.23 kg/m2 General:   Frail, elderly lady of [place] origin. Somewhat hard of hearing . Appears to be in no acute distress. Accompanied by her son. Skin:  Intact without significant lesions or rashes. Eyes:  Sclera clear, no icterus.   Conjunctiva pink. Ears:  Normal auditory acuity. Nose:  No deformity, discharge,  or  lesions. Mouth:  No deformity or lesions. Neck:  Supple; no masses or thyromegaly. No significant cervical adenopathy. Lungs:  Clear throughout to auscultation.   No wheezes, crackles, or rhonchi. No acute distress. Heart:  Regular rate and rhythm; no murmurs, clicks, rubs,  or gallops. Abdomen: Abdominal binder in place. Abdomen nondistended. Positive bowel sounds.  Soft and nontender without appreciable mass or hepatosplenomegaly.  Pulses:  Normal pulses noted. Extremities:  Without clubbing or edema.  Impression/Plan:  Pleasant 79 year old lady with celiac disease now with worsening esophageal dysphagia. Takes diclofenac and Fosamax. Abnormal-appearing esophagus/upper GI tract at EGD 2 years ago. Further evaluation recommended but not done.  As a separate issue, no prior screening colonoscopy per report.  Recommendations:  Proceed with a barium pill esophagram/upper GI series to further evaluate the anatomy of her upper GI tract. Return a stool sample for occult blood testing. Further recommendations to follow. Continue Prilosec daily

## 2013-07-13 NOTE — Progress Notes (Signed)
Pt's O2 sat decreased to low 50's.   Non rebreather mask placed with supplemental 02 increased to 15 lpm.  O2 level increased steadily to 100.  Nasal canula replaced with 02 @ 3lpm.  O2 levels maintained in high 90's.  Pt on room air for 5 minutes prior to taking to recovery.  Maintained o2 level in high 90's

## 2013-07-15 ENCOUNTER — Other Ambulatory Visit (HOSPITAL_COMMUNITY): Payer: Self-pay | Admitting: Internal Medicine

## 2013-07-15 ENCOUNTER — Encounter (HOSPITAL_COMMUNITY): Payer: Self-pay | Admitting: Internal Medicine

## 2013-07-15 DIAGNOSIS — I10 Essential (primary) hypertension: Secondary | ICD-10-CM | POA: Diagnosis not present

## 2013-07-15 DIAGNOSIS — M810 Age-related osteoporosis without current pathological fracture: Secondary | ICD-10-CM | POA: Diagnosis not present

## 2013-07-15 DIAGNOSIS — E039 Hypothyroidism, unspecified: Secondary | ICD-10-CM | POA: Diagnosis not present

## 2013-07-15 DIAGNOSIS — K222 Esophageal obstruction: Secondary | ICD-10-CM | POA: Diagnosis not present

## 2013-07-17 ENCOUNTER — Encounter: Payer: Self-pay | Admitting: Internal Medicine

## 2013-07-19 ENCOUNTER — Encounter: Payer: Self-pay | Admitting: Internal Medicine

## 2013-07-19 ENCOUNTER — Telehealth: Payer: Self-pay

## 2013-07-19 NOTE — Telephone Encounter (Signed)
Pt is aware of OV on 11/11 at 10 with LSL and appt card was mailed

## 2013-07-19 NOTE — Telephone Encounter (Signed)
Letter from: Corbin Ade  Reason for Letter: Results Review Send letter to patient.  Send copy of letter with path to referring provider and PCP.  Needs ov w extender in next 3 months if not already scheduled;  Ulice Follett, plse call and tell pt to inc prilosec to bid

## 2013-07-20 ENCOUNTER — Ambulatory Visit (HOSPITAL_COMMUNITY)
Admission: RE | Admit: 2013-07-20 | Discharge: 2013-07-20 | Disposition: A | Discharge status: Home / Self Care | Payer: Medicare Other | Source: Ambulatory Visit | Attending: Internal Medicine | Admitting: Internal Medicine

## 2013-07-20 DIAGNOSIS — M810 Age-related osteoporosis without current pathological fracture: Secondary | ICD-10-CM | POA: Insufficient documentation

## 2013-07-20 MED ORDER — OMEPRAZOLE 20 MG PO CPDR: 20.0000 mg | DELAYED_RELEASE_CAPSULE | Freq: Two times a day (BID) | ORAL | Status: DC

## 2013-07-20 NOTE — Telephone Encounter (Signed)
Tried to call pt- NA 

## 2013-07-20 NOTE — Telephone Encounter (Signed)
pts son is aware and new rx has been sent to the pharmacy.

## 2013-08-03 ENCOUNTER — Encounter (HOSPITAL_COMMUNITY)
Admission: RE | Admit: 2013-08-03 | Discharge: 2013-08-03 | Disposition: A | Discharge status: Home / Self Care | Payer: Medicare Other | Source: Ambulatory Visit | Attending: Internal Medicine | Admitting: Internal Medicine

## 2013-08-03 DIAGNOSIS — M810 Age-related osteoporosis without current pathological fracture: Secondary | ICD-10-CM | POA: Insufficient documentation

## 2013-08-03 LAB — COMPREHENSIVE METABOLIC PANEL
ALT: 12 U/L (ref 0–35)
Albumin: 3.4 g/dL — ABNORMAL LOW (ref 3.5–5.2)
Alkaline Phosphatase: 55 U/L (ref 39–117)
BUN: 9 mg/dL (ref 6–23)
Calcium: 9 mg/dL (ref 8.4–10.5)
GFR calc Af Amer: 71 mL/min — ABNORMAL LOW (ref >90)
Glucose, Bld: 90 mg/dL (ref 70–99)
Potassium: 3.6 mEq/L (ref 3.5–5.1)
Sodium: 132 mEq/L — ABNORMAL LOW (ref 135–145)
Total Protein: 6.2 g/dL (ref 6.0–8.3)

## 2013-08-03 LAB — MAGNESIUM: Magnesium: 1.9 mg/dL (ref 1.5–2.5)

## 2013-08-03 MED ORDER — DENOSUMAB 60 MG/ML ~~LOC~~ SOLN
60.0000 mg | Freq: Once | SUBCUTANEOUS | Status: AC
  Administered 2013-08-03: 60 mg via SUBCUTANEOUS
  Filled 2013-08-03: qty 1

## 2013-08-03 NOTE — Progress Notes (Signed)
Results for MARGO, LAMA (MRN [id number]) as of 08/03/2013 14:15  Ref. Range 08/03/2013 11:35  Sodium Latest Range: 135-145 mEq/L 132 (L)  Potassium Latest Range: 3.5-5.1 mEq/L 3.6  Chloride Latest Range: 96-112 mEq/L 98  CO2 Latest Range: 19-32 mEq/L 24  BUN Latest Range: 6-23 mg/dL 9  Creatinine Latest Range: 0.50-1.10 mg/dL 4.09  Calcium Latest Range: 8.4-10.5 mg/dL 9.0  GFR calc non Af Amer Latest Range: >90 mL/min 62 (L)  GFR calc Af Amer Latest Range: >90 mL/min 71 (L)  Glucose Latest Range: 70-99 mg/dL 90  Magnesium Latest Range: 1.5-2.5 mg/dL 1.9  Alkaline Phosphatase Latest Range: 39-117 U/L 55  Albumin Latest Range: 3.5-5.2 g/dL 3.4 (L)  AST Latest Range: 0-37 U/L 25  ALT Latest Range: 0-35 U/L 12  Total Protein Latest Range: 6.0-8.3 g/dL 6.2  Total Bilirubin Latest Range: 0.3-1.2 mg/dL 0.5  Prolia 60mg  SQ given

## 2013-08-25 ENCOUNTER — Emergency Department (HOSPITAL_COMMUNITY): Payer: Medicare Other

## 2013-08-25 ENCOUNTER — Encounter (HOSPITAL_COMMUNITY): Payer: Self-pay | Admitting: *Deleted

## 2013-08-25 ENCOUNTER — Emergency Department (HOSPITAL_COMMUNITY)
Admission: EM | Admit: 2013-08-25 | Discharge: 2013-08-26 | Disposition: A | Discharge status: Home / Self Care | Payer: Medicare Other | Attending: Emergency Medicine | Admitting: Emergency Medicine

## 2013-08-25 DIAGNOSIS — I10 Essential (primary) hypertension: Secondary | ICD-10-CM | POA: Diagnosis not present

## 2013-08-25 DIAGNOSIS — M549 Dorsalgia, unspecified: Secondary | ICD-10-CM

## 2013-08-25 DIAGNOSIS — Z79899 Other long term (current) drug therapy: Secondary | ICD-10-CM | POA: Insufficient documentation

## 2013-08-25 DIAGNOSIS — Z96649 Presence of unspecified artificial hip joint: Secondary | ICD-10-CM | POA: Diagnosis not present

## 2013-08-25 DIAGNOSIS — R110 Nausea: Secondary | ICD-10-CM | POA: Insufficient documentation

## 2013-08-25 DIAGNOSIS — R141 Gas pain: Secondary | ICD-10-CM | POA: Diagnosis not present

## 2013-08-25 DIAGNOSIS — Z8781 Personal history of (healed) traumatic fracture: Secondary | ICD-10-CM | POA: Insufficient documentation

## 2013-08-25 DIAGNOSIS — R358 Other polyuria: Secondary | ICD-10-CM | POA: Insufficient documentation

## 2013-08-25 DIAGNOSIS — M545 Low back pain, unspecified: Secondary | ICD-10-CM | POA: Insufficient documentation

## 2013-08-25 DIAGNOSIS — K219 Gastro-esophageal reflux disease without esophagitis: Secondary | ICD-10-CM | POA: Diagnosis not present

## 2013-08-25 DIAGNOSIS — F209 Schizophrenia, unspecified: Secondary | ICD-10-CM | POA: Insufficient documentation

## 2013-08-25 DIAGNOSIS — F411 Generalized anxiety disorder: Secondary | ICD-10-CM | POA: Diagnosis not present

## 2013-08-25 DIAGNOSIS — E039 Hypothyroidism, unspecified: Secondary | ICD-10-CM | POA: Diagnosis not present

## 2013-08-25 DIAGNOSIS — Z791 Long term (current) use of non-steroidal anti-inflammatories (NSAID): Secondary | ICD-10-CM | POA: Diagnosis not present

## 2013-08-25 DIAGNOSIS — M899 Disorder of bone, unspecified: Secondary | ICD-10-CM | POA: Insufficient documentation

## 2013-08-25 DIAGNOSIS — R350 Frequency of micturition: Secondary | ICD-10-CM | POA: Diagnosis not present

## 2013-08-25 DIAGNOSIS — R1084 Generalized abdominal pain: Secondary | ICD-10-CM | POA: Diagnosis not present

## 2013-08-25 DIAGNOSIS — E079 Disorder of thyroid, unspecified: Secondary | ICD-10-CM | POA: Insufficient documentation

## 2013-08-25 DIAGNOSIS — R3589 Other polyuria: Secondary | ICD-10-CM | POA: Diagnosis not present

## 2013-08-25 DIAGNOSIS — R109 Unspecified abdominal pain: Secondary | ICD-10-CM

## 2013-08-25 LAB — CBC WITH DIFFERENTIAL/PLATELET
HCT: 40.5 % (ref 36.0–46.0)
Hemoglobin: 13.8 g/dL (ref 12.0–15.0)
Lymphocytes Relative: 23 % (ref 12–46)
Lymphs Abs: 1.2 10*3/uL (ref 0.7–4.0)
MCHC: 34.1 g/dL (ref 30.0–36.0)
Monocytes Absolute: 0.5 10*3/uL (ref 0.1–1.0)
Monocytes Relative: 8 % (ref 3–12)
Neutro Abs: 3.6 10*3/uL (ref 1.7–7.7)
Neutrophils Relative %: 68 % (ref 43–77)
RBC: 4.91 MIL/uL (ref 3.87–5.11)

## 2013-08-25 LAB — LACTIC ACID, PLASMA: Lactic Acid, Venous: 1.8 mmol/L (ref 0.5–2.2)

## 2013-08-25 LAB — URINALYSIS, ROUTINE W REFLEX MICROSCOPIC
Leukocytes, UA: NEGATIVE
Nitrite: NEGATIVE
Specific Gravity, Urine: 1.015 (ref 1.005–1.030)
Urobilinogen, UA: 0.2 mg/dL (ref 0.0–1.0)
pH: 7 (ref 5.0–8.0)

## 2013-08-25 LAB — BASIC METABOLIC PANEL
Calcium: 9 mg/dL (ref 8.4–10.5)
GFR calc non Af Amer: 84 mL/min — ABNORMAL LOW (ref >90)

## 2013-08-25 LAB — HEPATIC FUNCTION PANEL
Albumin: 4 g/dL (ref 3.5–5.2)
Alkaline Phosphatase: 70 U/L (ref 39–117)
Indirect Bilirubin: 0.5 mg/dL (ref 0.3–0.9)
Total Protein: 7.3 g/dL (ref 6.0–8.3)

## 2013-08-25 MED ORDER — SODIUM CHLORIDE 0.9 % IV BOLUS (SEPSIS)
500.0000 mL | Freq: Once | INTRAVENOUS | Status: AC
  Administered 2013-08-25: 500 mL via INTRAVENOUS

## 2013-08-25 MED ORDER — POTASSIUM CHLORIDE CRYS ER 20 MEQ PO TBCR
40.0000 meq | EXTENDED_RELEASE_TABLET | Freq: Once | ORAL | Status: AC
  Administered 2013-08-25: 40 meq via ORAL
  Filled 2013-08-25: qty 2

## 2013-08-25 MED ORDER — MORPHINE SULFATE 2 MG/ML IJ SOLN
2.0000 mg | Freq: Once | INTRAMUSCULAR | Status: AC
  Administered 2013-08-25: 2 mg via INTRAVENOUS
  Filled 2013-08-25: qty 1

## 2013-08-25 MED ORDER — IOHEXOL 300 MG/ML  SOLN
100.0000 mL | Freq: Once | INTRAMUSCULAR | Status: AC | PRN
  Administered 2013-08-25: 100 mL via INTRAVENOUS

## 2013-08-25 MED ORDER — ONDANSETRON HCL 4 MG/2ML IJ SOLN
4.0000 mg | Freq: Once | INTRAMUSCULAR | Status: AC
  Administered 2013-08-25: 4 mg via INTRAVENOUS
  Filled 2013-08-25: qty 2

## 2013-08-25 MED ORDER — IOHEXOL 300 MG/ML  SOLN
50.0000 mL | Freq: Once | INTRAMUSCULAR | Status: AC | PRN
  Administered 2013-08-25: 50 mL via ORAL

## 2013-08-25 MED ORDER — POLYETHYLENE GLYCOL 3350 17 G PO PACK
17.0000 g | PACK | Freq: Every day | ORAL | Status: DC
Start: 2013-08-25 — End: 2017-04-02

## 2013-08-25 NOTE — ED Notes (Signed)
Pt called to desk stating she was vomiting contrast, upon arrival to room pt was noted to have a small amount of clear contrast in a tissue. Radiology notified.

## 2013-08-25 NOTE — ED Notes (Signed)
Pt presents to ED with c/o bilateral flank pain that radiates into her abdomin and rt hip. Pt also c/o upper leg pain. Reports " walks everyday and may have over done it today".  Pt denies fever, and diarrhea at this time. Mild nausea, but believes it is from increasing pain.  Pt is alert and oriented x 4, skin pink, warm and dry to touch. Pt denies injury and falls at this time.

## 2013-08-25 NOTE — ED Provider Notes (Signed)
CSN: [id number]     Arrival date & time 08/25/13  1935 History  This chart was scribed for Audree Camel, MD by Blanchard Kelch, ED Scribe. The patient was seen in room APA14/AP[facility]14. Patient's care was started at 8:57 PM.    Chief Complaint  Patient presents with  . Hip Pain    Patient is a 79 y.o. female presenting with hip pain. The history is provided by the patient. No language interpreter was used.  Hip Pain Associated symptoms include abdominal pain.    HPI Comments: Felicia Harvey is a 79 y.o. female who presents to the Emergency Department complaining of constant, worsening lower back pain that radiates down to her hip and began two days ago. The pain is currently a 7-8/10 in severity. She is also having lower abdominal pain. She has been taking Tylenol for the pain without relief. She reports difficulty walking currently due to the pain. She complains of nausea and polyuria. She denies fever, dysuria, or vomiting. She denies any surgeries to her abdomen. She has a past surgical history of right hip replacement. She reports recently getting a 6 month Prolia injection.   Past Medical History  Diagnosis Date  . Thyroid disease   . HTN (hypertension)   . Reflux   . Anxiety   . Osteopenia   . GERD (gastroesophageal reflux disease)   . Hypothyroidism   . Dysphagia 09/21/2011  . Chest pain 09/21/2011  . Schizophrenia   . Polydipsia 09/21/2011  . 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 Celiac disease   . Knee fracture, left 03/2012  . Dysrhythmia     "heart arrthymia" per son, but unsure what kind   Past Surgical History  Procedure Laterality Date  . Left shoulder    . Thyroidectomy    . Vertebroplasty    . Left hip replacement    . Esophagogastroduodenoscopy  09/27/2011    Inflammatory changes midesophagus most likely pill-induced injury (Fosamax), large diaphragmatic hernia, biopsies from small bowel consistent with celiac disease  . Esophagogastroduodenoscopy (egd) with  esophageal dilation N/A 07/13/2013    Procedure: ESOPHAGOGASTRODUODENOSCOPY (EGD) WITH ESOPHAGEAL DILATION;  Surgeon: Corbin Ade, MD;  Location: [facility];  Service: Endoscopy;  Laterality: N/A;  1:00   Family History  Problem Relation Age of Onset  . Cancer    . Kidney disease     History  Substance Use Topics  . Smoking status: Never Smoker   . Smokeless tobacco: Not on file  . Alcohol Use: No     Comment: rarely   OB History   Grav Para Term Preterm Abortions TAB SAB Ect Mult Living                 Review of Systems  Constitutional: Negative for fever and chills.  Gastrointestinal: Positive for nausea and abdominal pain. Negative for vomiting.  Endocrine: Positive for polyuria.  Genitourinary: Positive for frequency. Negative for dysuria.  Musculoskeletal: Positive for back pain.       Right upper leg/hip pain  Neurological: Negative for weakness and numbness.  All other systems reviewed and are negative.    Allergies  Gluten  Home Medications   Current Outpatient Rx  Name  Route  Sig  Dispense  Refill  . acetaminophen (TYLENOL) 500 MG tablet   Oral   Take 500 mg by mouth daily as needed for pain.         . bisacodyl (DULCOLAX) 5 MG EC tablet   Oral  Take 5-10 mg by mouth daily as needed for constipation.         . calcium carbonate (OS-CAL - DOSED IN MG OF ELEMENTAL CALCIUM) 1250 MG tablet   Oral   Take 1 tablet by mouth every morning.         . denosumab (PROLIA) 60 MG/ML SOLN injection   Subcutaneous   Inject 60 mg into the skin every 6 (six) months. Administer in upper arm, thigh, or abdomen         . diclofenac (VOLTAREN) 50 MG EC tablet   Oral   Take 50 mg by mouth daily.          Marland Kitchen docusate sodium (COLACE) 100 MG capsule   Oral   Take 200 mg by mouth daily as needed for constipation.         Marland Kitchen levothyroxine (SYNTHROID, LEVOTHROID) 75 MCG tablet   Oral   Take 75 mcg by mouth every morning.         Marland Kitchen lisinopril  (PRINIVIL,ZESTRIL) 40 MG tablet   Oral   Take 40 mg by mouth every morning.          Marland Kitchen omeprazole (PRILOSEC) 20 MG capsule   Oral   Take 1 capsule (20 mg total) by mouth 2 (two) times daily.   60 capsule   3   . QUEtiapine (SEROQUEL) 100 MG tablet   Oral   Take 100 mg by mouth at bedtime.            Triage Vitals: BP 208/79  Pulse 73  Temp(Src) 97.4 F (36.3 C) (Oral)  Resp 16  Ht 5\' 1"  (1.549 m)  Wt 105 lb (47.628 kg)  BMI 19.85 kg/m2  SpO2 100%  Physical Exam  Nursing note and vitals reviewed. Constitutional: She is oriented to person, place, and time. She appears well-developed and well-nourished. No distress.  HENT:  Head: Normocephalic and atraumatic.  Eyes: EOM are normal.  Neck: Neck supple. No tracheal deviation present.  Cardiovascular: Normal rate, regular rhythm and normal heart sounds.   Pulses:      Femoral pulses are 2+ on the right side, and 2+ on the left side. Pulmonary/Chest: Effort normal and breath sounds normal. No respiratory distress.  Abdominal: Soft. She exhibits no distension. There is tenderness (generalized, worse in lower).  Musculoskeletal: Normal range of motion.  Tenderness to palpation of mid and right lower back (lumbar). No tenderness to palpation or pain with range of motion of right hip.  Neurological: She is alert and oriented to person, place, and time.  Skin: Skin is warm and dry.  Psychiatric: She has a normal mood and affect. Her behavior is normal.    ED Course  Procedures (including critical care time)  DIAGNOSTIC STUDIES: Oxygen Saturation is 100% on room air, normal by my interpretation.    COORDINATION OF CARE:  9:03 PM -Will order IV fluids, morphine and zofran injections and omnipaque. Will order  abdomen pelvis CT, blood lipase lab, CBC, BMP, hepatic functon panel, plasma lactic acid lab, I-Stat creatinine, urinalysis and urine culture. Patient verbalizes understanding and agrees with treatment plan.   Labs  Review Labs Reviewed  BASIC METABOLIC PANEL - Abnormal; Notable for the following:    Sodium 130 (*)    Potassium 3.2 (*)    Glucose, Bld 115 (*)    BUN 4 (*)    GFR calc non Af Amer 84 (*)    All other components within normal limits  URINALYSIS,  ROUTINE W REFLEX MICROSCOPIC - Abnormal; Notable for the following:    Ketones, ur TRACE (*)    All other components within normal limits  URINE CULTURE  CBC WITH DIFFERENTIAL  HEPATIC FUNCTION PANEL  LACTIC ACID, PLASMA  LIPASE, BLOOD  POCT I-STAT CREATININE   Imaging Review Ct Abdomen Pelvis W Contrast  08/25/2013   ADDENDUM REPORT: 08/25/2013 23:34  ADDENDUM: The appendix is identified with moderate confidence, and there is no evidence of acute appendicitis.   Electronically Signed   By: Tiburcio Pea   On: 08/25/2013 23:34   08/25/2013   CLINICAL DATA:  Lower abdominal and back pain  EXAM: CT ABDOMEN AND PELVIS WITH CONTRAST  TECHNIQUE: Multidetector CT imaging of the abdomen and pelvis was performed using the standard protocol following bolus administration of intravenous contrast.  CONTRAST:  50mL OMNIPAQUE IOHEXOL 300 MG/ML SOLN, OMNIPAQUE IOHEXOL 300 MG/ML SOLN  COMPARISON:  10/02/2005 lumbar spine CT  FINDINGS: BODY WALL: There is a small hernia in the right inguinal region, which may represent developing femoral or inguinal hernia.  LOWER CHEST:  Mediastinum: Aortic valvular calcification.  Lungs/pleura: Lower lung scarring and atelectasis.  ABDOMEN/PELVIS:  Liver: There are numerous low-density lesions throughout the liver, the largest of which appears circumscribed and low-density. Appearance is overall benign. Largest lesion is subcapsular right lobe, 18 mm.  Biliary: No evidence of biliary obstruction or stone.  Pancreas: Unremarkable.  Spleen: Unremarkable.  Adrenals: Unremarkable.  Kidneys and ureters: 3 cm exophytic cyst in the lower pole right kidney. There is a 14 mm left interpolar renal cyst. Additional low dense renal  foci are too small to characterize.  Bladder: Unremarkable.  Bowel: No obstruction. Under rotation of the bowel, with the duodenum not crossing midline and small bowel clustered in the right abdomen. There is gaseous distention of colon diffusely. No evidence of mechanical obstruction.  Retroperitoneum: No mass or adenopathy.  Peritoneum: No free fluid or gas.  Reproductive: There are 2 pelvic cystic lesions, the largest on the right, posterior to the uterus, measuring 7.5 cm in diameter. This likely arises from the right ovary, although there is broad contact with the anteriorly displaced uterus. The left ovary contains a 3.4 cm low-density lesion.  Vascular: No acute abnormality.  OSSEOUS: Remote posterior right 10th rib fracture. Remote L1 compression fracture status post vertebroplasty. Remote right lower sacral ala fracture, with sclerosis. Remote right iliac wing fracture, acute 09/20/2011. Remote right inferior pubic ramus fracture. Bipolar left hip hemiarthroplasty, unremarkable. No suspicious lytic or blastic lesions.  IMPRESSION: 1. No acute intra-abdominal findings. 2. Gaseous distention of colon without obstruction. 3. Two adnexal cystic masses, the larger on the right measuring 7.5 cm. Outpatient pelvic ultrasound recommended. 4. Developing right groin hernia, favor femoral hernia.  Electronically Signed: By: Tiburcio Pea On: 08/25/2013 23:21    MDM   1. Back pain   2. Abdominal pain    Her pain improved with morphine and Zofran. Is a large amount of gas seen on CT scan is likely a cause of pain. I feel that ovarian torsion from these cysts in her pelvis are unlikely. Patient is calm and appears well. I discussed with the radiologist Dr. Grace Isaac who states that he did see the appendix is normal. Will treat with MiraLax and stools softeners and hydration. Discussed strict return precautions for the abdominal and back pain. I discussed needing to f/u with PCP in 12 hours or here in 12 hours for  an abdominal recheck if still  having pain. Otherwise if pain worsens she needs to return sooner. The patient was ambulated in the emergency department and did well w/o any acute issues.   I personally performed the services described in this documentation, which was scribed in my presence. The recorded information has been reviewed and is accurate.    Audree Camel, MD 08/26/13 [phone]

## 2013-08-25 NOTE — ED Notes (Signed)
Pt ambulated to rest room and back.  Gait steady with assistance of cane.  Pt reporting mild abdominal pain, but improved after pain medication.

## 2013-08-25 NOTE — ED Notes (Addendum)
Pt reporting pain in back radiating to right hip.  Reports pain began Monday and has gotten worse. Pt ambulating with assistance of cane. Reports minor relief with ES Tylenol.

## 2013-08-27 LAB — URINE CULTURE
Colony Count: NO GROWTH
Culture: NO GROWTH

## 2013-08-30 ENCOUNTER — Ambulatory Visit (HOSPITAL_COMMUNITY)
Admission: RE | Admit: 2013-08-30 | Discharge: 2013-08-30 | Disposition: A | Discharge status: Home / Self Care | Payer: Medicare Other | Source: Ambulatory Visit | Attending: Internal Medicine | Admitting: Internal Medicine

## 2013-08-30 ENCOUNTER — Other Ambulatory Visit (HOSPITAL_COMMUNITY): Payer: Self-pay | Admitting: Internal Medicine

## 2013-08-30 DIAGNOSIS — M79609 Pain in unspecified limb: Secondary | ICD-10-CM | POA: Insufficient documentation

## 2013-08-30 DIAGNOSIS — R1900 Intra-abdominal and pelvic swelling, mass and lump, unspecified site: Secondary | ICD-10-CM | POA: Diagnosis not present

## 2013-08-31 ENCOUNTER — Other Ambulatory Visit (HOSPITAL_COMMUNITY): Payer: Self-pay | Admitting: Internal Medicine

## 2013-08-31 DIAGNOSIS — R1900 Intra-abdominal and pelvic swelling, mass and lump, unspecified site: Secondary | ICD-10-CM

## 2013-09-03 ENCOUNTER — Ambulatory Visit (HOSPITAL_COMMUNITY)
Admission: RE | Admit: 2013-09-03 | Discharge: 2013-09-03 | Disposition: A | Discharge status: Home / Self Care | Payer: Medicare Other | Source: Ambulatory Visit | Attending: Internal Medicine | Admitting: Internal Medicine

## 2013-09-03 DIAGNOSIS — R1903 Right lower quadrant abdominal swelling, mass and lump: Secondary | ICD-10-CM | POA: Insufficient documentation

## 2013-09-03 DIAGNOSIS — R9389 Abnormal findings on diagnostic imaging of other specified body structures: Secondary | ICD-10-CM | POA: Insufficient documentation

## 2013-09-03 DIAGNOSIS — R1900 Intra-abdominal and pelvic swelling, mass and lump, unspecified site: Secondary | ICD-10-CM

## 2013-09-03 DIAGNOSIS — N9489 Other specified conditions associated with female genital organs and menstrual cycle: Secondary | ICD-10-CM | POA: Diagnosis not present

## 2013-09-20 DIAGNOSIS — M79609 Pain in unspecified limb: Secondary | ICD-10-CM | POA: Diagnosis not present

## 2013-09-20 DIAGNOSIS — R1900 Intra-abdominal and pelvic swelling, mass and lump, unspecified site: Secondary | ICD-10-CM | POA: Diagnosis not present

## 2013-09-20 DIAGNOSIS — M810 Age-related osteoporosis without current pathological fracture: Secondary | ICD-10-CM | POA: Diagnosis not present

## 2013-09-28 ENCOUNTER — Encounter: Payer: Self-pay | Admitting: Obstetrics and Gynecology

## 2013-09-28 ENCOUNTER — Ambulatory Visit (INDEPENDENT_AMBULATORY_CARE_PROVIDER_SITE_OTHER): Payer: Medicare Other | Admitting: Obstetrics and Gynecology

## 2013-09-28 VITALS — BP 158/90 | Wt 101.2 lb

## 2013-09-28 DIAGNOSIS — R1900 Intra-abdominal and pelvic swelling, mass and lump, unspecified site: Secondary | ICD-10-CM | POA: Diagnosis not present

## 2013-09-28 DIAGNOSIS — D3910 Neoplasm of uncertain behavior of unspecified ovary: Secondary | ICD-10-CM | POA: Diagnosis not present

## 2013-09-28 DIAGNOSIS — D3912 Neoplasm of uncertain behavior of left ovary: Secondary | ICD-10-CM

## 2013-09-28 DIAGNOSIS — N839 Noninflammatory disorder of ovary, fallopian tube and broad ligament, unspecified: Secondary | ICD-10-CM | POA: Diagnosis not present

## 2013-09-28 DIAGNOSIS — N838 Other noninflammatory disorders of ovary, fallopian tube and broad ligament: Secondary | ICD-10-CM

## 2013-09-28 NOTE — Progress Notes (Signed)
    [facility] Visit  Patient name: Felicia Harvey MRN [id number]  Date of birth: 12/14/1928  CC & HPI:  Felicia Harvey is a 79 y.o. female presenting today for consult regarding ovarian masses/adnexal masses that are simple cystic with right side greater than 7 cm. Neither side shows any internal echoes and, neither have L. and areas in neither have internal septations that are thick patient is asymptomatic.  ROS:  No weight loss bloating, appetite change mentioned.  Pertinent History Reviewed:  Medical & Surgical Hx:  Reviewed: Significant for C. Hpi and Dr. Scharlene Gloss notes Medications: Reviewed & Updated - see associated section Social History: Reviewed -  reports that she has never smoked. She does not have any smokeless tobacco history on file.  Objective Findings:  Vitals: BP 158/90  Wt 101 lb 3.2 oz (45.904 kg)  BMI 19.13 kg/m2  Physical Examination: General appearance - alert, well appearing, and in no distress, oriented to person, place, and time and normal appearing weight Mental status - alert, oriented to person, place, and time, normal mood, behavior, speech, dress, motor activity, and thought processes Abdomen - soft, nontender, nondistended, no masses or organomegaly Pelvic - normal external genitalia, vulva, vagina, cervix, uterus and adnexa, the ovarian or adnexal masses cannot be palpated easily there soft no fixation   Assessment & Plan:   Bilateral cystic masses of low suspicion for malignancy Recommend CA 125 level now and repeat in 3 months. Repeat CA 125 level at 6, and 12 months months along with pelvic ultrasound. Unless elevated CA 125 levels or significant progression in adnexal mass size occurs, we will simply allow her to go about her normal activity with no plans for surgery We appreciate being allowed to make this delightful older patient

## 2013-09-29 LAB — CA 125: CA 125: 103.6 U/mL — ABNORMAL HIGH (ref 0.0–30.2)

## 2013-10-04 ENCOUNTER — Other Ambulatory Visit: Payer: Self-pay

## 2013-10-04 MED ORDER — OMEPRAZOLE 20 MG PO CPDR: 20.0000 mg | DELAYED_RELEASE_CAPSULE | Freq: Two times a day (BID) | ORAL | Status: DC

## 2013-10-19 ENCOUNTER — Encounter: Payer: Self-pay | Admitting: Gastroenterology

## 2013-10-19 ENCOUNTER — Encounter (INDEPENDENT_AMBULATORY_CARE_PROVIDER_SITE_OTHER): Payer: Self-pay

## 2013-10-19 ENCOUNTER — Ambulatory Visit (INDEPENDENT_AMBULATORY_CARE_PROVIDER_SITE_OTHER): Payer: Medicare Other | Admitting: Gastroenterology

## 2013-10-19 VITALS — BP 139/75 | HR 44 | Temp 98.3°F | Wt 98.0 lb

## 2013-10-19 DIAGNOSIS — Z1211 Encounter for screening for malignant neoplasm of colon: Secondary | ICD-10-CM | POA: Diagnosis not present

## 2013-10-19 DIAGNOSIS — K900 Celiac disease: Secondary | ICD-10-CM

## 2013-10-19 NOTE — Assessment & Plan Note (Signed)
Continue gluten free diet.  

## 2013-10-19 NOTE — Patient Instructions (Signed)
1. Decrease omeprazole to once daily. If you have recurrent heartburn, problems swallowing, or abdominal pain, please let us know and increase omeprazole back to twice daily. 2. Avoid Fosamax. 3. Continue gluten free diet.

## 2013-10-19 NOTE — Assessment & Plan Note (Signed)
Doing better. Drop back on omeprazole to once daily. Avoid fosamax as before. OV in one year.   As an aside, patient to have consultation with surgeon next week for ovarian mass and CA-125 elevation.

## 2013-10-19 NOTE — Assessment & Plan Note (Signed)
ifobt negative in 06/2013. No anemia. At advanced age, screening colonoscopy not necessary and at this time patient is not interested.

## 2013-10-19 NOTE — Progress Notes (Signed)
Primary Care Physician: Catalina Pizza, MD  Primary Gastroenterologist:  Roetta Sessions, MD   Chief Complaint  Patient presents with  . Follow-up    HPI: Felicia Harvey is a 79 y.o. female here for followup of upper endoscopy done in August 2014. Prior to that she complained of dysphagia, intermittent pill dysphagia in solid foods. Was on Fosamax. 2012 she had what was concerning for pill-induced injury to her esophagus at that time. History of large diaphragmatic hernia with questionable paraesophageal component as well. History of celiac disease, gluten free diet. Recent EGD showed distal esophagitis likely pill-induced. Esophageal biopsy showed acute on chronic inflammation but benign. Noncritical Schatzki ring dilated with a 54 French Maloney  dilator. Abnormal duodenum consistent with prior diagnosis of celiac disease.  Patient was increased to twice a day Prilosec back in August. She wonders if she can drop back to once daily at this point. Overall she and her son feel that she has been doing fine with regards to swallowing. She has not complained of any odynophagia or heartburn. Appetite is stable. Denies abdominal pain. Bowel movements are regular. No melena rectal bleeding. Recently was evaluated by Dr. Emelda Fear for the ovarian mass. CA 125 was elevated therefore she is seeing a Careers adviser this week.  Current Outpatient Prescriptions  Medication Sig Dispense Refill  . acetaminophen (TYLENOL) 500 MG tablet Take 325 mg by mouth every 6 (six) hours as needed for pain.       . bisacodyl (DULCOLAX) 5 MG EC tablet Take 5-10 mg by mouth daily as needed for constipation.      . calcium carbonate (OS-CAL - DOSED IN MG OF ELEMENTAL CALCIUM) 1250 MG tablet Take 1 tablet by mouth every morning.      . denosumab (PROLIA) 60 MG/ML SOLN injection Inject 60 mg into the skin every 6 (six) months. Administer in upper arm, thigh, or abdomen      . diclofenac (VOLTAREN) 50 MG EC tablet Take 50 mg by mouth daily.        Marland Kitchen docusate sodium (COLACE) 100 MG capsule Take 200 mg by mouth daily as needed for constipation.      . Esomeprazole Magnesium (NEXIUM PO) Take 20 mg by mouth daily.      Marland Kitchen levothyroxine (SYNTHROID, LEVOTHROID) 75 MCG tablet Take 75 mcg by mouth every morning.      Marland Kitchen lisinopril (PRINIVIL,ZESTRIL) 40 MG tablet Take 40 mg by mouth every morning.       . MULTIPLE VITAMIN PO Take 1 tablet by mouth daily.      Marland Kitchen omeprazole (PRILOSEC) 20 MG capsule Take 1 capsule (20 mg total) by mouth 2 (two) times daily.  60 capsule  5  . polyethylene glycol (MIRALAX / GLYCOLAX) packet Take 17 g by mouth daily.  14 each  0  . promethazine (PHENERGAN) 12.5 MG tablet Take 12.5 mg by mouth every 6 (six) hours as needed for nausea.      Marland Kitchen QUEtiapine (SEROQUEL) 100 MG tablet Take 100 mg by mouth at bedtime.         No current facility-administered medications for this visit.    Allergies as of 10/19/2013 - Review Complete 10/19/2013  Allergen Reaction Noted  . Gluten  09/20/2011    ROS:  General: Negative for anorexia, weight loss, fever, chills, fatigue, weakness. ENT: Negative for hoarseness, difficulty swallowing , nasal congestion. CV: Negative for chest pain, angina, palpitations, dyspnea on exertion, peripheral edema.  Respiratory: Negative for dyspnea at rest, dyspnea on  exertion, cough, sputum, wheezing.  GI: See history of present illness. GU:  Negative for dysuria, hematuria, urinary incontinence, urinary frequency, nocturnal urination.  Endo: Negative for unusual weight change.    Physical Examination:   BP 139/75  Pulse 44  Temp(Src) 98.3 F (36.8 C) (Oral)  Wt 98 lb (44.453 kg)  General: Well-nourished, well-developed in no acute distress. Accompanied by son.  Eyes: No icterus. Mouth: Oropharyngeal mucosa moist and pink , no lesions erythema or exudate. Lungs: Clear to auscultation bilaterally.  Heart: Regular rate and rhythm, no murmurs rubs or gallops.  Abdomen: Bowel sounds are  normal, nontender, nondistended, no hepatosplenomegaly or masses, no abdominal bruits or hernia , no rebound or guarding.   Extremities: No lower extremity edema. No clubbing or deformities. Neuro: Alert and oriented x 4   Skin: Warm and dry, no jaundice.   Psych: Alert and cooperative, normal mood and affect.  Labs:  Lab Results  Component Value Date   WBC 5.3 08/25/2013   HGB 13.8 08/25/2013   HCT 40.5 08/25/2013   MCV 82.5 08/25/2013   PLT 249 08/25/2013   Lab Results  Component Value Date   CREATININE 0.70 08/25/2013   BUN 4* 08/25/2013   NA 130* 08/25/2013   K 3.2* 08/25/2013   CL 96 08/25/2013   CO2 22 08/25/2013   Lab Results  Component Value Date   ALT 14 08/25/2013   AST 25 08/25/2013   ALKPHOS 70 08/25/2013   BILITOT 0.6 08/25/2013    Imaging Studies: No results found.

## 2013-10-21 ENCOUNTER — Encounter: Payer: Self-pay | Admitting: Gynecologic Oncology

## 2013-10-21 ENCOUNTER — Ambulatory Visit: Payer: Medicare Other | Attending: Gynecologic Oncology | Admitting: Gynecologic Oncology

## 2013-10-21 VITALS — BP 170/100 | HR 74 | Temp 97.4°F | Resp 16 | Ht 62.01 in | Wt 100.0 lb

## 2013-10-21 DIAGNOSIS — E039 Hypothyroidism, unspecified: Secondary | ICD-10-CM | POA: Diagnosis not present

## 2013-10-21 DIAGNOSIS — N838 Other noninflammatory disorders of ovary, fallopian tube and broad ligament: Secondary | ICD-10-CM

## 2013-10-21 DIAGNOSIS — I10 Essential (primary) hypertension: Secondary | ICD-10-CM | POA: Diagnosis not present

## 2013-10-21 DIAGNOSIS — Z79899 Other long term (current) drug therapy: Secondary | ICD-10-CM | POA: Insufficient documentation

## 2013-10-21 DIAGNOSIS — N839 Noninflammatory disorder of ovary, fallopian tube and broad ligament, unspecified: Secondary | ICD-10-CM | POA: Diagnosis not present

## 2013-10-21 NOTE — Progress Notes (Signed)
Consult Note: Gyn-Onc  Felicia Harvey 79 y.o. female  CC:  Chief Complaint  Patient presents with  . Ovarian mass    New Consult    HPI:  Patient is seen today in consultation at the request of Dr. Christin Bach.  Patient is a 79-year-old gravida 5 para 3 who experienced some lower abdominal discomfort and went to the hospital had a CT scan performed which revealed: FINDINGS:  BODY WALL: There is a small hernia in the right inguinal region, which may represent developing femoral or inguinal hernia.  LOWER CHEST: Mediastinum: Aortic valvular calcification. Lungs/pleura: Lower lung scarring and atelectasis.  ABDOMEN/PELVIS: Liver: There are numerous low-density lesions throughout the liver, the largest of which appears circumscribed and low-density. Appearance is overall benign. Largest lesion is subcapsular right lobe, 18 mm. Biliary: No evidence of biliary obstruction or stone. Pancreas: Unremarkable. Spleen: Unremarkable. Adrenals: Unremarkable. Kidneys and ureters: 3 cm exophytic cyst in the lower pole right kidney. There is a 14 mm left interpolar renal cyst. Additional low dense renal foci are too small to characterize. Bladder: Unremarkable. Bowel: No obstruction. Under rotation of the bowel, with the duodenum not crossing midline and small bowel clustered in the right abdomen. There is gaseous distention of colon diffusely. No evidence of mechanical obstruction. Retroperitoneum: No mass or adenopathy. Peritoneum: No free fluid or gas.   Reproductive: There are 2 pelvic cystic lesions, the largest on the right, posterior to the uterus, measuring 7.5 cm in diameter. This likely arises from the right ovary, although there is broad contact with the anteriorly displaced uterus. The left ovary contains a 3.4 cm low-density lesion. Vascular: No acute abnormality. OSSEOUS: Remote posterior right 10th rib fracture. Remote L1 compression fracture status post vertebroplasty. Remote right lower sacral  ala fracture, with sclerosis. Remote right iliac wing fracture, acute 09/20/2011. Remote right inferior pubic ramus fracture. Bipolar left hip hemiarthroplasty, unremarkable. No suspicious lytic or blastic lesions.   IMPRESSION:  1. No acute intra-abdominal findings.  2. Gaseous distention of colon without obstruction.  3. Two adnexal cystic masses, the larger on the right measuring 7.5 cm. Outpatient pelvic ultrasound recommended.  4. Developing right groin hernia, favor femoral hernia.  She also had an ultrasound which revealed:  COMPARISON: CT 08/25/2013  FINDINGS:  Uterus Measurements: Not well visualized. MEASUREMENTS could not be  accurately obtained. Endometrium Thickness: Not well visualized. Measurements could not be accurately obtained. Right ovary Measurements: 8.7 x 8.6 cm cystic anechoic appearing mass lesion in the right adnexa. No separate ovarian tissue could be identified. Left ovary Measurements: 5.1 x 4.0 x 3.8 cm. Cyst with mildly crenellated margins common internally anechoic, measuring 3.8 x 3.5 x 3.4 cm. Other findings: No free fluid   IMPRESSION:  Suboptimal visualization with transabdominal technique only. The patient could not tolerate transvaginal imaging. Bilateral cystic-appearing adnexal masses, favored to be ovarian in origin on the left and of unclear origin on the right; the questioned finding could correspond to an ovarian or adnexal cyst, although the uterus is not well visualized and hematometrocolpos could mimic this appearance, based on review of the prior exam findings. Mural infolding or possible focal nodularity in the left ovarian cyst.  She was seen by Dr. Emelda Fear on October 21. At that time she really had no significant symptoms. Her CA 125 was obtained it was 103.6. For this reason she is referred to [place] today. She states that the pain is pretty much resolved itself. She does complain of her abdomen being "swollen"  and she feels "pregnant". Most of her  pain is associated with her orthopedic injuries. She is very active she walks about an hour a day. She lives with her son who is disabled and she is very independent. She does complain of left knee and left hip pain but does have antedated these known pelvic masses. She has a history of prior falls but has not fallen in quite some time. She is very hard of hearing. It is notable that today is her 79th birthday. Specific review of systems includes that she denies any chest pain, shortness of breath, nausea, vomiting, fevers, chills. She denies any early satiety.  Review of Systems:  Constitutional: Denies fever. She denies any intentional weight loss or weight gain. Skin: No rash, sores, jaundice, itching, or dryness.  Cardiovascular: No chest pain, shortness of breath, or edema  Pulmonary: No cough or wheeze.  Gastro Intestinal: Reporting intermittent lower abdominal soreness.  No nausea, vomiting, constipation, or diarrhea reported. No bright red blood per rectum or change in bowel movement.  Genitourinary: No frequency, urgency, or dysuria.  Denies vaginal bleeding and discharge.  Musculoskeletal: She endorses back, left knee, left hip pain Neurologic: No weakness, numbness, or change in gait.  Psychology: No changes   Current Meds:  Outpatient Encounter Prescriptions as of 10/21/2013  Medication Sig  . acetaminophen (TYLENOL) 500 MG tablet Take 325 mg by mouth every 6 (six) hours as needed for pain.   . bisacodyl (DULCOLAX) 5 MG EC tablet Take 5-10 mg by mouth daily as needed for constipation.  . calcium carbonate (OS-CAL - DOSED IN MG OF ELEMENTAL CALCIUM) 1250 MG tablet Take 1 tablet by mouth every morning.  . denosumab (PROLIA) 60 MG/ML SOLN injection Inject 60 mg into the skin every 6 (six) months. Administer in upper arm, thigh, or abdomen  . diclofenac (VOLTAREN) 50 MG EC tablet Take 50 mg by mouth daily.   Marland Kitchen docusate sodium (COLACE) 100 MG capsule Take 200 mg by mouth daily as needed  for constipation.  Marland Kitchen levothyroxine (SYNTHROID, LEVOTHROID) 75 MCG tablet Take 75 mcg by mouth every morning.  Marland Kitchen lisinopril (PRINIVIL,ZESTRIL) 40 MG tablet Take 40 mg by mouth every morning.   . MULTIPLE VITAMIN PO Take 1 tablet by mouth daily.  Marland Kitchen omeprazole (PRILOSEC) 20 MG capsule Take 1 capsule (20 mg total) by mouth 2 (two) times daily.  . polyethylene glycol (MIRALAX / GLYCOLAX) packet Take 17 g by mouth daily.  . promethazine (PHENERGAN) 12.5 MG tablet Take 12.5 mg by mouth every 6 (six) hours as needed for nausea.  Marland Kitchen QUEtiapine (SEROQUEL) 100 MG tablet Take 100 mg by mouth at bedtime.      Allergy:  Allergies  Allergen Reactions  . Gluten     Celiac disease    Social Hx:   History   Social History  . Marital Status: Divorced    Spouse Name: N/A    Number of Children: N/A  . Years of Education: 12th   Occupational History  . Retired    Social History Main Topics  . Smoking status: Never Smoker   . Smokeless tobacco: Not on file  . Alcohol Use: No     Comment: rarely  . Drug Use: No  . Sexual Activity: Not on file   Other Topics Concern  . Not on file   Social History Narrative  . No narrative on file    Past Surgical Hx:  Past Surgical History  Procedure Laterality Date  . Left  shoulder    . Thyroidectomy    . Vertebroplasty    . Left hip replacement    . Esophagogastroduodenoscopy  09/27/2011    Inflammatory changes midesophagus most likely pill-induced injury (Fosamax), large diaphragmatic hernia, biopsies from small bowel consistent with celiac disease  . Esophagogastroduodenoscopy (egd) with esophageal dilation N/A 07/13/2013    VOZ:DGUYQI esophagitis-more likely pill-induced s/p esophageal biopsy. Noncritical. Schatzki's ring s/p passage of a Maloney dilator. Hiatal hernia. Abnormal duodenum consistent with prior diagnosis of celiac disease. Status post 25 French Maloney dilator. Esophageal biopsies benign showed marked acute and chronic inflammation.     Past Medical Hx:  Past Medical History  Diagnosis Date  . Thyroid disease   . HTN (hypertension)   . Reflux   . Anxiety   . Osteopenia   . GERD (gastroesophageal reflux disease)   . Hypothyroidism   . Dysphagia 09/21/2011  . Chest pain 09/21/2011  . Schizophrenia   . Polydipsia 09/21/2011  . 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 Celiac disease   . Knee fracture, left 03/2012  . Dysrhythmia     "heart arrthymia" per son, but unsure what kind    Oncology Hx:   No history exists.    Family Hx:  Family History  Problem Relation Age of Onset  . Cancer Sister     brain tumor  . Hypertension Brother   . Hypertension Daughter   . Kidney disease Sister     Vitals:  Blood pressure 170/100, pulse 74, temperature 97.4 F (36.3 C), temperature source Oral, resp. rate 16, height 5' 2.01" (1.575 m), weight 100 lb (45.36 kg).  Physical Exam: Well-nourished well-developed female in no acute distress.  Neck: Status post right clavicle fracture. No lymphadenopathy, no thyromegaly.  Lungs: Marked kyphosis. Lungs are clear to auscultation bilaterally.  Cardiovascular: Regular rate rhythm.  Abdomen: Protuberant, soft, nontender, nondistended. There is no fluid wave. There is no hepatosplenomegaly.  Groins: No lymphadenopathy.  Extremities: No edema.  Pelvic: External genitalia atrophic. Bimanual examination the cervix is palpably normal. I cannot appreciate an enlarged uterus. There is no defined adnexal masses. There is no nodularity.  Assessment/Plan: 79 year old with bilateral adnexal masses that in and of themselves radiographically do not appear malignant. However, her CA 125 is elevated at 103.6. I had a lengthy discussion with the patient and her daughters B. very many non-oncologic reasons for an elevation in CA 125 and again I truly believe that these masses do not represent a malignancy. However, after discussion they wish to have them surgically removed. We will  tentatively plan for surgery on December 9. We'll attempt a laparoscopic bilateral salpingo-oophorectomy. He we will be sent for frozen section. We discussed Gen. anesthesia and the need for her to spend the night in the hospital. They motivated to have the surgery done prior to the end of the calendar year due to insurance purposes. We will contact Dr. Shelva Majestic her primary care physician to ensure that there is no perioperative concerns. She be seen by anesthesia for preoperative visit. She her daughters are instructed for her to stop the Voltaren one week prior surgery.  The questions were elicited in answer to their satisfaction. They will call us if they have any other questions prior to surgery  Felicia Harvey A., MD 10/21/2013, 1:01 PM

## 2013-10-21 NOTE — Patient Instructions (Signed)
Ovarian Cyst  The ovaries are small organs that are on each side of the uterus. The ovaries are the organs that produce the female hormones, estrogen and progesterone. An ovarian cyst is a sac filled with fluid that can vary in its size. It is normal for a small cyst to form in women who are in the childbearing age and who have menstrual periods. This type of cyst is called a follicle cyst that becomes an ovulation cyst (corpus luteum cyst) after it produces the women's egg. It later goes away on its own if the woman does not become pregnant. There are other kinds of ovarian cysts that may cause problems and may need to be treated. The most serious problem is a cyst with cancer. It should be noted that menopausal women who have an ovarian cyst are at a higher risk of it being a cancer cyst. They should be evaluated very quickly, thoroughly and followed closely. This is especially true in menopausal women because of the high rate of ovarian cancer in women in menopause.  CAUSES AND TYPES OF OVARIAN CYSTS:   FUNCTIONAL CYST: The follicle/corpus luteum cyst is a functional cyst that occurs every month during ovulation with the menstrual cycle. They go away with the next menstrual cycle if the woman does not get pregnant. Usually, there are no symptoms with a functional cyst.   ENDOMETRIOMA CYST: This cyst develops from the lining of the uterus tissue. This cyst gets in or on the ovary. It grows every month from the bleeding during the menstrual period. It is also called a "chocolate cyst" because it becomes filled with blood that turns brown. This cyst can cause pain in the lower abdomen during intercourse and with your menstrual period.   CYSTADENOMA CYST: This cyst develops from the cells on the outside of the ovary. They usually are not cancerous. They can get very big and cause lower abdomen pain and pain with intercourse. This type of cyst can twist on itself, cut off its blood supply and cause severe pain. It  also can easily rupture and cause a lot of pain.   DERMOID CYST: This type of cyst is sometimes found in both ovaries. They are found to have different kinds of body tissue in the cyst. The tissue includes skin, teeth, hair, and/or cartilage. They usually do not have symptoms unless they get very big. Dermoid cysts are rarely cancerous.   POLYCYSTIC OVARY: This is a rare condition with hormone problems that produces many small cysts on both ovaries. The cysts are follicle-like cysts that never produce an egg and become a corpus luteum. It can cause an increase in body weight, infertility, acne, increase in body and facial hair and lack of menstrual periods or rare menstrual periods. Many women with this problem develop type 2 diabetes. The exact cause of this problem is unknown. A polycystic ovary is rarely cancerous.   THECA LUTEIN CYST: Occurs when too much hormone (human chorionic gonadotropin) is produced and over-stimulates the ovaries to produce an egg. They are frequently seen when doctors stimulate the ovaries for invitro-fertilization (test tube babies).   LUTEOMA CYST: This cyst is seen during pregnancy. Rarely it can cause an obstruction to the birth canal during labor and delivery. They usually go away after delivery.  SYMPTOMS    Pelvic pain or pressure.   Pain during sexual intercourse.   Increasing girth (swelling) of the abdomen.   Abnormal menstrual periods.   Increasing pain with menstrual periods.     You stop having menstrual periods and you are not pregnant.  DIAGNOSIS   The diagnosis can be made during:   Routine or annual pelvic examination (common).   Ultrasound.   X-ray of the pelvis.   CT Scan.   MRI.   Blood tests.  TREATMENT    Treatment may only be to follow the cyst monthly for 2 to 3 months with your caregiver. Many go away on their own, especially functional cysts.   May be aspirated (drained) with a long needle with ultrasound, or by laparoscopy (inserting a tube into  the pelvis through a small incision).   The whole cyst can be removed by laparoscopy.   Sometimes the cyst may need to be removed through an incision in the lower abdomen.   Hormone treatment is sometimes used to help dissolve certain cysts.   Birth control pills are sometimes used to help dissolve certain cysts.  HOME CARE INSTRUCTIONS   Follow your caregiver's advice regarding:   Medicine.   Follow up visits to evaluate and treat the cyst.   You may need to come back or make an appointment with another caregiver, to find the exact cause of your cyst, if your caregiver is not a gynecologist.   Get your yearly and recommended pelvic examinations and Pap tests.   Let your caregiver know if you have had an ovarian cyst in the past.  SEEK MEDICAL CARE IF:    Your periods are late, irregular, they stop, or are painful.   Your stomach (abdomen) or pelvic pain does not go away.   Your stomach becomes larger or swollen.   You have pressure on your bladder or trouble emptying your bladder completely.   You have painful sexual intercourse.   You have feelings of fullness, pressure, or discomfort in your stomach.   You lose weight for no apparent reason.   You feel generally ill.   You become constipated.   You lose your appetite.   You develop acne.   You have an increase in body and facial hair.   You are gaining weight, without changing your exercise and eating habits.   You think you are pregnant.  SEEK IMMEDIATE MEDICAL CARE IF:    You have increasing abdominal pain.   You feel sick to your stomach (nausea) and/or vomit.   You develop a fever that comes on suddenly.   You develop abdominal pain during a bowel movement.   Your menstrual periods become heavier than usual.  Document Released: 11/25/2005 Document Revised: 02/17/2012 Document Reviewed: 09/28/2009  [employer] Patient Information 2014 [employer]

## 2013-10-29 ENCOUNTER — Other Ambulatory Visit (HOSPITAL_COMMUNITY): Payer: Self-pay | Admitting: *Deleted

## 2013-11-01 ENCOUNTER — Encounter (HOSPITAL_COMMUNITY): Payer: Self-pay

## 2013-11-01 ENCOUNTER — Encounter (HOSPITAL_COMMUNITY)
Admission: RE | Admit: 2013-11-01 | Discharge: 2013-11-01 | Disposition: A | Discharge status: Home / Self Care | Payer: Medicare Other | Source: Ambulatory Visit | Attending: Gynecologic Oncology | Admitting: Gynecologic Oncology

## 2013-11-01 ENCOUNTER — Ambulatory Visit (HOSPITAL_COMMUNITY)
Admission: RE | Admit: 2013-11-01 | Discharge: 2013-11-01 | Disposition: A | Discharge status: Home / Self Care | Payer: Medicare Other | Source: Ambulatory Visit | Attending: Gynecologic Oncology | Admitting: Gynecologic Oncology

## 2013-11-01 DIAGNOSIS — Z01812 Encounter for preprocedural laboratory examination: Secondary | ICD-10-CM | POA: Insufficient documentation

## 2013-11-01 DIAGNOSIS — I517 Cardiomegaly: Secondary | ICD-10-CM | POA: Diagnosis not present

## 2013-11-01 DIAGNOSIS — N839 Noninflammatory disorder of ovary, fallopian tube and broad ligament, unspecified: Secondary | ICD-10-CM | POA: Diagnosis not present

## 2013-11-01 DIAGNOSIS — Z01818 Encounter for other preprocedural examination: Secondary | ICD-10-CM | POA: Insufficient documentation

## 2013-11-01 HISTORY — DX: Sleep disorder, unspecified: G47.9

## 2013-11-01 HISTORY — DX: Constipation, unspecified: K59.00

## 2013-11-01 HISTORY — DX: Other noninflammatory disorders of ovary, fallopian tube and broad ligament: N83.8

## 2013-11-01 LAB — COMPREHENSIVE METABOLIC PANEL WITH GFR
ALT: 11 U/L (ref 0–35)
AST: 22 U/L (ref 0–37)
Albumin: 3.6 g/dL (ref 3.5–5.2)
Alkaline Phosphatase: 44 U/L (ref 39–117)
BUN: 14 mg/dL (ref 6–23)
CO2: 23 meq/L (ref 19–32)
Calcium: 8.8 mg/dL (ref 8.4–10.5)
Chloride: 101 meq/L (ref 96–112)
Creatinine, Ser: 0.73 mg/dL (ref 0.50–1.10)
GFR calc Af Amer: 90 mL/min — ABNORMAL LOW
GFR calc non Af Amer: 77 mL/min — ABNORMAL LOW
Glucose, Bld: 97 mg/dL (ref 70–99)
Potassium: 3.9 meq/L (ref 3.5–5.1)
Sodium: 132 meq/L — ABNORMAL LOW (ref 135–145)
Total Bilirubin: 0.3 mg/dL (ref 0.3–1.2)
Total Protein: 6.3 g/dL (ref 6.0–8.3)

## 2013-11-01 LAB — URINALYSIS, ROUTINE W REFLEX MICROSCOPIC
Bilirubin Urine: NEGATIVE
Glucose, UA: NEGATIVE mg/dL
Hgb urine dipstick: NEGATIVE
Ketones, ur: NEGATIVE mg/dL
Nitrite: NEGATIVE
Protein, ur: NEGATIVE mg/dL
Specific Gravity, Urine: 1.016 (ref 1.005–1.030)
Urobilinogen, UA: 1 mg/dL (ref 0.0–1.0)
pH: 6.5 (ref 5.0–8.0)

## 2013-11-01 LAB — CBC WITH DIFFERENTIAL/PLATELET
Basophils Absolute: 0.1 10*3/uL (ref 0.0–0.1)
Eosinophils Relative: 4 % (ref 0–5)
HCT: 35.4 % — ABNORMAL LOW (ref 36.0–46.0)
Lymphocytes Relative: 29 % (ref 12–46)
MCHC: 33.3 g/dL (ref 30.0–36.0)
MCV: 84.7 fL (ref 78.0–100.0)
Neutro Abs: 2.9 10*3/uL (ref 1.7–7.7)
Platelets: 241 10*3/uL (ref 150–400)
RBC: 4.18 MIL/uL (ref 3.87–5.11)
RDW: 14.2 % (ref 11.5–15.5)
WBC: 5.2 10*3/uL (ref 4.0–10.5)

## 2013-11-01 LAB — URINE MICROSCOPIC-ADD ON

## 2013-11-01 NOTE — Patient Instructions (Signed)
ELYCE ZOLLINGER  11/01/2013                           YOUR PROCEDURE IS SCHEDULED ON: 11/16/13               PLEASE REPORT TO SHORT STAY CENTER AT : 5;30 AM               CALL THIS NUMBER IF ANY PROBLEMS THE DAY OF SURGERY :               [phone]                      REMEMBER:   Do not eat food or drink liquids AFTER MIDNIGHT   Take these medicines the morning of surgery with A SIP OF WATER:  LEVTHYROXINE / OMEPRAZOLE   Do not wear jewelry, make-up   Do not wear lotions, powders, or perfumes.   Do not shave legs or underarms 12 hrs. before surgery (men may shave face)  Do not bring valuables to the hospital.  Contacts, dentures or bridgework may not be worn into surgery.  Leave suitcase in the car. After surgery it may be brought to your room.  For patients admitted to the hospital more than one night, checkout time is 11:00                          The day of discharge.   Patients discharged the day of surgery will not be allowed to drive home                             If going home same day of surgery, must have someone stay with you first                           24 hrs at home and arrange for some one to drive you home from hospital.    Special Instructions:   Please read over the following fact sheets that you were given:               1.[employer]                    2.  [facility] PREPARING FOR SURGERY SHEET                                                X_____________________________________________________________________        Failure to follow these instructions may result in cancellation of your surgery

## 2013-11-15 ENCOUNTER — Telehealth: Payer: Self-pay | Admitting: Gynecologic Oncology

## 2013-11-15 NOTE — Anesthesia Preprocedure Evaluation (Addendum)
Anesthesia Evaluation  Patient identified by MRN, date of birth, ID band Patient awake    Reviewed: Allergy & Precautions, H&P , NPO status , Patient's Chart, lab work & pertinent test results  Airway       Dental  (+) Dental Advisory Given   Pulmonary neg pulmonary ROS, COPD         Cardiovascular hypertension, Pt. on medications + dysrhythmias  Echo 09/2011: - Left ventricle: The cavity size was normal. There was mild concentric hypertrophy. Systolic function was normal. The estimated ejection fraction was in the range of 55% to 60%. Wall motion was normal; there were no regional wall motion abnormalities. - Aortic valve: Cusp separation was mildly reduced. Mild   regurgitation. - Mitral valve: Calcified annulus. - Left atrium: The atrium was mildly dilated. - Atrial septum: No defect or patent foramen ovale was   identified.    Neuro/Psych  Headaches, PSYCHIATRIC DISORDERS Anxiety Depression    GI/Hepatic Neg liver ROS, GERD-  Medicated,  Endo/Other  negative endocrine ROSHypothyroidism   Renal/GU negative Renal ROS     Musculoskeletal negative musculoskeletal ROS (+)   Abdominal   Peds  Hematology negative hematology ROS (+) anemia ,   Anesthesia Other Findings   Reproductive/Obstetrics negative OB ROS                          Anesthesia Physical Anesthesia Plan  ASA: III  Anesthesia Plan: General   Post-op Pain Management:    Induction: Intravenous  Airway Management Planned: Oral ETT  Additional Equipment:   Intra-op Plan:   Post-operative Plan: Extubation in OR  Informed Consent: I have reviewed the patients History and Physical, chart, labs and discussed the procedure including the risks, benefits and alternatives for the proposed anesthesia with the patient or authorized representative who has indicated his/her understanding and acceptance.   Dental advisory  given  Plan Discussed with: CRNA  Anesthesia Plan Comments:         Anesthesia Quick Evaluation

## 2013-11-15 NOTE — Telephone Encounter (Signed)
Message left with both daughters about mother's upcoming surgery tomorrow.  Called to make sure there were no questions or concerns.  Advised to please call the office for any needs.

## 2013-11-16 ENCOUNTER — Encounter (HOSPITAL_COMMUNITY)
Admission: RE | Disposition: A | Discharge status: Home / Self Care | Payer: Self-pay | Source: Ambulatory Visit | Attending: Gynecologic Oncology

## 2013-11-16 ENCOUNTER — Encounter (HOSPITAL_COMMUNITY): Payer: Medicare Other | Admitting: Anesthesiology

## 2013-11-16 ENCOUNTER — Encounter (HOSPITAL_COMMUNITY): Payer: Self-pay

## 2013-11-16 ENCOUNTER — Ambulatory Visit (HOSPITAL_COMMUNITY): Payer: Medicare Other | Admitting: Anesthesiology

## 2013-11-16 ENCOUNTER — Ambulatory Visit (HOSPITAL_COMMUNITY)
Admission: RE | Admit: 2013-11-16 | Discharge: 2013-11-16 | Disposition: A | Discharge status: Home / Self Care | Payer: Medicare Other | Source: Ambulatory Visit | Attending: Gynecologic Oncology | Admitting: Gynecologic Oncology

## 2013-11-16 ENCOUNTER — Other Ambulatory Visit: Payer: Self-pay | Admitting: Gynecologic Oncology

## 2013-11-16 DIAGNOSIS — K219 Gastro-esophageal reflux disease without esophagitis: Secondary | ICD-10-CM | POA: Insufficient documentation

## 2013-11-16 DIAGNOSIS — I10 Essential (primary) hypertension: Secondary | ICD-10-CM | POA: Diagnosis not present

## 2013-11-16 DIAGNOSIS — K4090 Unilateral inguinal hernia, without obstruction or gangrene, not specified as recurrent: Secondary | ICD-10-CM | POA: Insufficient documentation

## 2013-11-16 DIAGNOSIS — Z79899 Other long term (current) drug therapy: Secondary | ICD-10-CM | POA: Diagnosis not present

## 2013-11-16 DIAGNOSIS — N8353 Torsion of ovary, ovarian pedicle and fallopian tube: Secondary | ICD-10-CM | POA: Insufficient documentation

## 2013-11-16 DIAGNOSIS — E039 Hypothyroidism, unspecified: Secondary | ICD-10-CM | POA: Diagnosis not present

## 2013-11-16 DIAGNOSIS — D279 Benign neoplasm of unspecified ovary: Secondary | ICD-10-CM | POA: Insufficient documentation

## 2013-11-16 DIAGNOSIS — N83209 Unspecified ovarian cyst, unspecified side: Secondary | ICD-10-CM | POA: Diagnosis not present

## 2013-11-16 DIAGNOSIS — R1900 Intra-abdominal and pelvic swelling, mass and lump, unspecified site: Secondary | ICD-10-CM

## 2013-11-16 DIAGNOSIS — R971 Elevated cancer antigen 125 [CA 125]: Secondary | ICD-10-CM | POA: Insufficient documentation

## 2013-11-16 HISTORY — PX: LAPAROSCOPIC SALPINGO OOPHERECTOMY: SHX5927

## 2013-11-16 SURGERY — SALPINGO-OOPHORECTOMY, LAPAROSCOPIC
Anesthesia: General | Site: Abdomen | Laterality: Bilateral

## 2013-11-16 MED ORDER — 0.9 % SODIUM CHLORIDE (POUR BTL) OPTIME
TOPICAL | Status: DC | PRN
  Administered 2013-11-16: 1000 mL

## 2013-11-16 MED ORDER — FENTANYL CITRATE 0.05 MG/ML IJ SOLN
25.0000 ug | INTRAMUSCULAR | Status: DC | PRN
  Administered 2013-11-16: 50 ug via INTRAVENOUS
  Administered 2013-11-16: 25 ug via INTRAVENOUS

## 2013-11-16 MED ORDER — SUCCINYLCHOLINE CHLORIDE 20 MG/ML IJ SOLN
INTRAMUSCULAR | Status: AC
  Filled 2013-11-16: qty 1

## 2013-11-16 MED ORDER — PROPOFOL 10 MG/ML IV BOLUS
INTRAVENOUS | Status: DC | PRN
  Administered 2013-11-16: 120 mg via INTRAVENOUS

## 2013-11-16 MED ORDER — HYDROMORPHONE HCL PF 1 MG/ML IJ SOLN
0.2500 mg | INTRAMUSCULAR | Status: DC | PRN
  Administered 2013-11-16: 0.5 mg via INTRAVENOUS
  Administered 2013-11-16 (×2): 0.25 mg via INTRAVENOUS

## 2013-11-16 MED ORDER — ACETAMINOPHEN-CODEINE #3 300-30 MG PO TABS: 1.0000 | ORAL_TABLET | ORAL | Status: DC | PRN

## 2013-11-16 MED ORDER — OXYCODONE HCL 5 MG PO TABS
5.0000 mg | ORAL_TABLET | Freq: Once | ORAL | Status: AC | PRN
  Administered 2013-11-16: 2.5 mg via ORAL
  Filled 2013-11-16: qty 1

## 2013-11-16 MED ORDER — LIDOCAINE HCL (CARDIAC) 20 MG/ML IV SOLN
INTRAVENOUS | Status: DC | PRN
  Administered 2013-11-16: 50 mg via INTRAVENOUS

## 2013-11-16 MED ORDER — GLYCOPYRROLATE 0.2 MG/ML IJ SOLN
INTRAMUSCULAR | Status: AC
  Filled 2013-11-16: qty 2

## 2013-11-16 MED ORDER — HYDROMORPHONE HCL PF 1 MG/ML IJ SOLN
INTRAMUSCULAR | Status: AC
  Filled 2013-11-16: qty 1

## 2013-11-16 MED ORDER — PROMETHAZINE HCL 25 MG/ML IJ SOLN
6.2500 mg | INTRAMUSCULAR | Status: DC | PRN
  Administered 2013-11-16: 6.25 mg via INTRAVENOUS
  Filled 2013-11-16: qty 1

## 2013-11-16 MED ORDER — GLYCOPYRROLATE 0.2 MG/ML IJ SOLN
INTRAMUSCULAR | Status: DC | PRN
  Administered 2013-11-16: 0.4 mg via INTRAVENOUS
  Administered 2013-11-16: 0.2 mg via INTRAVENOUS

## 2013-11-16 MED ORDER — NEOSTIGMINE METHYLSULFATE 1 MG/ML IJ SOLN
INTRAMUSCULAR | Status: AC
  Filled 2013-11-16: qty 10

## 2013-11-16 MED ORDER — ACETAMINOPHEN-CODEINE #2 300-15 MG PO TABS: 1.0000 | ORAL_TABLET | ORAL | Status: DC | PRN

## 2013-11-16 MED ORDER — HEPARIN SODIUM (PORCINE) 1000 UNIT/ML IJ SOLN
INTRAMUSCULAR | Status: DC | PRN
  Administered 2013-11-16: 1000 [IU]

## 2013-11-16 MED ORDER — LACTATED RINGERS IV SOLN
INTRAVENOUS | Status: DC | PRN
  Administered 2013-11-16 (×2): via INTRAVENOUS

## 2013-11-16 MED ORDER — LACTATED RINGERS IR SOLN
Status: DC | PRN
  Administered 2013-11-16: 1000 mL

## 2013-11-16 MED ORDER — FENTANYL CITRATE 0.05 MG/ML IJ SOLN
INTRAMUSCULAR | Status: DC | PRN
  Administered 2013-11-16: 50 ug via INTRAVENOUS
  Administered 2013-11-16: 100 ug via INTRAVENOUS
  Administered 2013-11-16 (×2): 50 ug via INTRAVENOUS

## 2013-11-16 MED ORDER — OXYCODONE HCL 5 MG/5ML PO SOLN
5.0000 mg | Freq: Once | ORAL | Status: AC | PRN
  Filled 2013-11-16: qty 5

## 2013-11-16 MED ORDER — PHENYLEPHRINE 40 MCG/ML (10ML) SYRINGE FOR IV PUSH (FOR BLOOD PRESSURE SUPPORT)
PREFILLED_SYRINGE | INTRAVENOUS | Status: AC
  Filled 2013-11-16: qty 10

## 2013-11-16 MED ORDER — FENTANYL CITRATE 0.05 MG/ML IJ SOLN
INTRAMUSCULAR | Status: AC
  Filled 2013-11-16: qty 2

## 2013-11-16 MED ORDER — PROPOFOL 10 MG/ML IV BOLUS
INTRAVENOUS | Status: AC
  Filled 2013-11-16: qty 20

## 2013-11-16 MED ORDER — ONDANSETRON HCL 4 MG/2ML IJ SOLN
INTRAMUSCULAR | Status: DC | PRN
  Administered 2013-11-16: 4 mg via INTRAVENOUS

## 2013-11-16 MED ORDER — GLYCOPYRROLATE 0.2 MG/ML IJ SOLN
INTRAMUSCULAR | Status: AC
  Filled 2013-11-16: qty 1

## 2013-11-16 MED ORDER — NEOSTIGMINE METHYLSULFATE 1 MG/ML IJ SOLN
INTRAMUSCULAR | Status: DC | PRN
  Administered 2013-11-16: 3 mg via INTRAVENOUS

## 2013-11-16 MED ORDER — MEPERIDINE HCL 50 MG/ML IJ SOLN: 6.2500 mg | INTRAMUSCULAR | Status: DC | PRN

## 2013-11-16 MED ORDER — FENTANYL CITRATE 0.05 MG/ML IJ SOLN
INTRAMUSCULAR | Status: AC
  Filled 2013-11-16: qty 5

## 2013-11-16 MED ORDER — LACTATED RINGERS IV SOLN
INTRAVENOUS | Status: DC
  Administered 2013-11-16: 12:00:00 via INTRAVENOUS

## 2013-11-16 MED ORDER — ROCURONIUM BROMIDE 100 MG/10ML IV SOLN
INTRAVENOUS | Status: AC
  Filled 2013-11-16: qty 1

## 2013-11-16 MED ORDER — PHENYLEPHRINE HCL 10 MG/ML IJ SOLN
INTRAMUSCULAR | Status: DC | PRN
  Administered 2013-11-16: 40 ug via INTRAVENOUS
  Administered 2013-11-16: 80 ug via INTRAVENOUS

## 2013-11-16 MED ORDER — LIDOCAINE HCL (CARDIAC) 20 MG/ML IV SOLN
INTRAVENOUS | Status: AC
  Filled 2013-11-16: qty 5

## 2013-11-16 MED ORDER — HEPARIN SODIUM (PORCINE) 1000 UNIT/ML IJ SOLN
INTRAMUSCULAR | Status: AC
  Filled 2013-11-16: qty 1

## 2013-11-16 MED ORDER — ONDANSETRON HCL 4 MG/2ML IJ SOLN
INTRAMUSCULAR | Status: AC
  Filled 2013-11-16: qty 2

## 2013-11-16 MED ORDER — ROCURONIUM BROMIDE 100 MG/10ML IV SOLN
INTRAVENOUS | Status: DC | PRN
  Administered 2013-11-16: 30 mg via INTRAVENOUS

## 2013-11-16 SURGICAL SUPPLY — 50 items
BAG URINE DRAINAGE (UROLOGICAL SUPPLIES) IMPLANT
BENZOIN TINCTURE PRP APPL 2/3 (GAUZE/BANDAGES/DRESSINGS) ×2 IMPLANT
BLADE SURG 15 STRL LF DISP TIS (BLADE) ×1 IMPLANT
BLADE SURG 15 STRL SS (BLADE) ×1
CABLE HIGH FREQUENCY MONO STRZ (ELECTRODE) IMPLANT
CANISTER SUCTION 2500CC (MISCELLANEOUS) ×2 IMPLANT
CATH FOLEY 2WAY SLVR  5CC 16FR (CATHETERS)
CATH FOLEY 2WAY SLVR 5CC 16FR (CATHETERS) IMPLANT
CATH ROBINSON RED A/P 16FR (CATHETERS) IMPLANT
CHLORAPREP W/TINT 26ML (MISCELLANEOUS) ×2 IMPLANT
CLEANER TIP ELECTROSURG 2X2 (MISCELLANEOUS) IMPLANT
CONT SPEC 4OZ CLIKSEAL STRL BL (MISCELLANEOUS) ×2 IMPLANT
COVER MAYO STAND STRL (DRAPES) ×2 IMPLANT
DEVICE TROCAR PUNCTURE CLOSURE (ENDOMECHANICALS) IMPLANT
DRAPE TABLE BACK 44X90 PK DISP (DRAPES) ×2 IMPLANT
DRSG TEGADERM 2-3/8X2-3/4 SM (GAUZE/BANDAGES/DRESSINGS) ×8 IMPLANT
ELECT REM PT RETURN 9FT ADLT (ELECTROSURGICAL) ×2
ELECTRODE REM PT RTRN 9FT ADLT (ELECTROSURGICAL) ×1 IMPLANT
GAUZE SPONGE 2X2 8PLY STRL LF (GAUZE/BANDAGES/DRESSINGS) ×1 IMPLANT
GAUZE SPONGE 4X4 16PLY XRAY LF (GAUZE/BANDAGES/DRESSINGS) IMPLANT
GLOVE BIO SURGEON STRL SZ 6.5 (GLOVE) ×2 IMPLANT
GLOVE BIO SURGEON STRL SZ7.5 (GLOVE) ×4 IMPLANT
GLOVE BIOGEL PI IND STRL 7.0 (GLOVE) ×1 IMPLANT
GLOVE BIOGEL PI INDICATOR 7.0 (GLOVE) ×1
HOLDER FOLEY CATH W/STRAP (MISCELLANEOUS) ×2 IMPLANT
KIT BASIN OR (CUSTOM PROCEDURE TRAY) ×2 IMPLANT
MANIPULATOR UTERINE 4.5 ZUMI (MISCELLANEOUS) ×2 IMPLANT
NS IRRIG 1000ML POUR BTL (IV SOLUTION) IMPLANT
PACK GENERAL/GYN (CUSTOM PROCEDURE TRAY) IMPLANT
PENCIL BUTTON HOLSTER BLD 10FT (ELECTRODE) IMPLANT
POUCH SPECIMEN RETRIEVAL 10MM (ENDOMECHANICALS) IMPLANT
SCISSORS LAP 5X35 DISP (ENDOMECHANICALS) ×2 IMPLANT
SET IRRIG TUBING LAPAROSCOPIC (IRRIGATION / IRRIGATOR) ×2 IMPLANT
SHEET LAVH (DRAPES) ×2 IMPLANT
SPONGE GAUZE 2X2 STER 10/PKG (GAUZE/BANDAGES/DRESSINGS) ×1
SPONGE GAUZE 4X4 12PLY (GAUZE/BANDAGES/DRESSINGS) ×2 IMPLANT
STRIP CLOSURE SKIN 1/2X4 (GAUZE/BANDAGES/DRESSINGS) ×2 IMPLANT
SUT VIC AB 3-0 PS2 18 (SUTURE)
SUT VIC AB 3-0 PS2 18XBRD (SUTURE) IMPLANT
SUT VIC AB 4-0 PS2 27 (SUTURE) ×2 IMPLANT
SUT VICRYL 0 UR6 27IN ABS (SUTURE) IMPLANT
SYR CONTROL 10ML LL (SYRINGE) IMPLANT
TOWEL OR 17X26 10 PK STRL BLUE (TOWEL DISPOSABLE) ×2 IMPLANT
TRAY FOLEY CATH 14FRSI W/METER (CATHETERS) ×2 IMPLANT
TROCAR BLADELESS OPT 5 100 (ENDOMECHANICALS) IMPLANT
TROCAR BLADELESS OPT 5 75 (ENDOMECHANICALS) ×4 IMPLANT
TROCAR XCEL 12X100 BLDLESS (ENDOMECHANICALS) ×2 IMPLANT
TROCAR XCEL BLUNT TIP 100MML (ENDOMECHANICALS) ×2 IMPLANT
TUBING NON-CON 1/4 X 20 CONN (TUBING) ×2 IMPLANT
UNDERPAD 30X30 INCONTINENT (UNDERPADS AND DIAPERS) ×2 IMPLANT

## 2013-11-16 NOTE — Progress Notes (Addendum)
Paged Dr Duard Brady that pharmacies do not carry the tylenol #2 . [facility] for tylenol #3.   Dr Duard Brady wrote Rx for tylenol # 3. Family member picked up prescription from cancer center and filled at out patient pharmacy.

## 2013-11-16 NOTE — Anesthesia Postprocedure Evaluation (Signed)
Anesthesia Post Note  Patient: Felicia Harvey  Procedure(s) Performed: Procedure(s) (LRB): LAPAROSCOPIC BILATERAL SALPINGO OOPHORECTOMY (Bilateral)  Anesthesia type: General  Patient location: PACU  Post pain: Pain level controlled  Post assessment: Post-op Vital signs reviewed  Last Vitals: BP 135/62  Pulse 86  Temp(Src) 35.9 C  Resp 17  SpO2 96%  Post vital signs: Reviewed  Level of consciousness: sedated  Complications: No apparent anesthesia complications

## 2013-11-16 NOTE — H&P (View-Only) (Signed)
Consult Note: Gyn-Onc  Felicia Harvey 79 y.o. female  CC:  Chief Complaint  Patient presents with  . Ovarian mass    New Consult    HPI:  Patient is seen today in consultation at the request of Dr. John Ferguson.  Patient is a 4-year-old gravida 5 para 3 who experienced some lower abdominal discomfort and went to the hospital had a CT scan performed which revealed: FINDINGS:  BODY WALL: There is a small hernia in the right inguinal region, which may represent developing femoral or inguinal hernia.  LOWER CHEST: Mediastinum: Aortic valvular calcification. Lungs/pleura: Lower lung scarring and atelectasis.  ABDOMEN/PELVIS: Liver: There are numerous low-density lesions throughout the liver, the largest of which appears circumscribed and low-density. Appearance is overall benign. Largest lesion is subcapsular right lobe, 18 mm. Biliary: No evidence of biliary obstruction or stone. Pancreas: Unremarkable. Spleen: Unremarkable. Adrenals: Unremarkable. Kidneys and ureters: 3 cm exophytic cyst in the lower pole right kidney. There is a 14 mm left interpolar renal cyst. Additional low dense renal foci are too small to characterize. Bladder: Unremarkable. Bowel: No obstruction. Under rotation of the bowel, with the duodenum not crossing midline and small bowel clustered in the right abdomen. There is gaseous distention of colon diffusely. No evidence of mechanical obstruction. Retroperitoneum: No mass or adenopathy. Peritoneum: No free fluid or gas.   Reproductive: There are 2 pelvic cystic lesions, the largest on the right, posterior to the uterus, measuring 7.5 cm in diameter. This likely arises from the right ovary, although there is broad contact with the anteriorly displaced uterus. The left ovary contains a 3.4 cm low-density lesion. Vascular: No acute abnormality. OSSEOUS: Remote posterior right 10th rib fracture. Remote L1 compression fracture status post vertebroplasty. Remote right lower sacral  ala fracture, with sclerosis. Remote right iliac wing fracture, acute 09/20/2011. Remote right inferior pubic ramus fracture. Bipolar left hip hemiarthroplasty, unremarkable. No suspicious lytic or blastic lesions.   IMPRESSION:  1. No acute intra-abdominal findings.  2. Gaseous distention of colon without obstruction.  3. Two adnexal cystic masses, the larger on the right measuring 7.5 cm. Outpatient pelvic ultrasound recommended.  4. Developing right groin hernia, favor femoral hernia.  She also had an ultrasound which revealed:  COMPARISON: CT 08/25/2013  FINDINGS:  Uterus Measurements: Not well visualized. MEASUREMENTS could not be  accurately obtained. Endometrium Thickness: Not well visualized. Measurements could not be accurately obtained. Right ovary Measurements: 8.7 x 8.6 cm cystic anechoic appearing mass lesion in the right adnexa. No separate ovarian tissue could be identified. Left ovary Measurements: 5.1 x 4.0 x 3.8 cm. Cyst with mildly crenellated margins common internally anechoic, measuring 3.8 x 3.5 x 3.4 cm. Other findings: No free fluid   IMPRESSION:  Suboptimal visualization with transabdominal technique only. The patient could not tolerate transvaginal imaging. Bilateral cystic-appearing adnexal masses, favored to be ovarian in origin on the left and of unclear origin on the right; the questioned finding could correspond to an ovarian or adnexal cyst, although the uterus is not well visualized and hematometrocolpos could mimic this appearance, based on review of the prior exam findings. Mural infolding or possible focal nodularity in the left ovarian cyst.  She was seen by Dr. Ferguson on October 21. At that time she really had no significant symptoms. Her CA 125 was obtained it was 103.6. For this reason she is referred to us today. She states that the pain is pretty much resolved itself. She does complain of her abdomen being "swollen"   and she feels "pregnant". Most of her  pain is associated with her orthopedic injuries. She is very active she walks about an hour a day. She lives with her son who is disabled and she is very independent. She does complain of left knee and left hip pain but does have antedated these known pelvic masses. She has a history of prior falls but has not fallen in quite some time. She is very hard of hearing. It is notable that today is her 84nd birthday. Specific review of systems includes that she denies any chest pain, shortness of breath, nausea, vomiting, fevers, chills. She denies any early satiety.  Review of Systems:  Constitutional: Denies fever. She denies any intentional weight loss or weight gain. Skin: No rash, sores, jaundice, itching, or dryness.  Cardiovascular: No chest pain, shortness of breath, or edema  Pulmonary: No cough or wheeze.  Gastro Intestinal: Reporting intermittent lower abdominal soreness.  No nausea, vomiting, constipation, or diarrhea reported. No bright red blood per rectum or change in bowel movement.  Genitourinary: No frequency, urgency, or dysuria.  Denies vaginal bleeding and discharge.  Musculoskeletal: She endorses back, left knee, left hip pain Neurologic: No weakness, numbness, or change in gait.  Psychology: No changes   Current Meds:  Outpatient Encounter Prescriptions as of 10/21/2013  Medication Sig  . acetaminophen (TYLENOL) 500 MG tablet Take 325 mg by mouth every 6 (six) hours as needed for pain.   . bisacodyl (DULCOLAX) 5 MG EC tablet Take 5-10 mg by mouth daily as needed for constipation.  . calcium carbonate (OS-CAL - DOSED IN MG OF ELEMENTAL CALCIUM) 1250 MG tablet Take 1 tablet by mouth every morning.  . denosumab (PROLIA) 60 MG/ML SOLN injection Inject 60 mg into the skin every 6 (six) months. Administer in upper arm, thigh, or abdomen  . diclofenac (VOLTAREN) 50 MG EC tablet Take 50 mg by mouth daily.   . docusate sodium (COLACE) 100 MG capsule Take 200 mg by mouth daily as needed  for constipation.  . levothyroxine (SYNTHROID, LEVOTHROID) 75 MCG tablet Take 75 mcg by mouth every morning.  . lisinopril (PRINIVIL,ZESTRIL) 40 MG tablet Take 40 mg by mouth every morning.   . MULTIPLE VITAMIN PO Take 1 tablet by mouth daily.  . omeprazole (PRILOSEC) 20 MG capsule Take 1 capsule (20 mg total) by mouth 2 (two) times daily.  . polyethylene glycol (MIRALAX / GLYCOLAX) packet Take 17 g by mouth daily.  . promethazine (PHENERGAN) 12.5 MG tablet Take 12.5 mg by mouth every 6 (six) hours as needed for nausea.  . QUEtiapine (SEROQUEL) 100 MG tablet Take 100 mg by mouth at bedtime.      Allergy:  Allergies  Allergen Reactions  . Gluten     Celiac disease    Social Hx:   History   Social History  . Marital Status: Divorced    Spouse Name: N/A    Number of Children: N/A  . Years of Education: 12th   Occupational History  . Retired    Social History Main Topics  . Smoking status: Never Smoker   . Smokeless tobacco: Not on file  . Alcohol Use: No     Comment: rarely  . Drug Use: No  . Sexual Activity: Not on file   Other Topics Concern  . Not on file   Social History Narrative  . No narrative on file    Past Surgical Hx:  Past Surgical History  Procedure Laterality Date  . Left   shoulder    . Thyroidectomy    . Vertebroplasty    . Left hip replacement    . Esophagogastroduodenoscopy  09/27/2011    Inflammatory changes midesophagus most likely pill-induced injury (Fosamax), large diaphragmatic hernia, biopsies from small bowel consistent with celiac disease  . Esophagogastroduodenoscopy (egd) with esophageal dilation N/A 07/13/2013    RMR:Distal esophagitis-more likely pill-induced s/p esophageal biopsy. Noncritical. Schatzki's ring s/p passage of a Maloney dilator. Hiatal hernia. Abnormal duodenum consistent with prior diagnosis of celiac disease. Status post 54 French Maloney dilator. Esophageal biopsies benign showed marked acute and chronic inflammation.     Past Medical Hx:  Past Medical History  Diagnosis Date  . Thyroid disease   . HTN (hypertension)   . Reflux   . Anxiety   . Osteopenia   . GERD (gastroesophageal reflux disease)   . Hypothyroidism   . Dysphagia 09/21/2011  . Chest pain 09/21/2011  . Schizophrenia   . Polydipsia 09/21/2011  . 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 Celiac disease   . Knee fracture, left 03/2012  . Dysrhythmia     "heart arrthymia" per son, but unsure what kind    Oncology Hx:   No history exists.    Family Hx:  Family History  Problem Relation Age of Onset  . Cancer Sister     brain tumor  . Hypertension Brother   . Hypertension Daughter   . Kidney disease Sister     Vitals:  Blood pressure 170/100, pulse 74, temperature 97.4 F (36.3 C), temperature source Oral, resp. rate 16, height 5' 2.01" (1.575 m), weight 100 lb (45.36 kg).  Physical Exam: Well-nourished well-developed female in no acute distress.  Neck: Status post right clavicle fracture. No lymphadenopathy, no thyromegaly.  Lungs: Marked kyphosis. Lungs are clear to auscultation bilaterally.  Cardiovascular: Regular rate rhythm.  Abdomen: Protuberant, soft, nontender, nondistended. There is no fluid wave. There is no hepatosplenomegaly.  Groins: No lymphadenopathy.  Extremities: No edema.  Pelvic: External genitalia atrophic. Bimanual examination the cervix is palpably normal. I cannot appreciate an enlarged uterus. There is no defined adnexal masses. There is no nodularity.  Assessment/Plan: 79-year-old with bilateral adnexal masses that in and of themselves radiographically do not appear malignant. However, her CA 125 is elevated at 103.6. I had a lengthy discussion with the patient and her daughters B. very many non-oncologic reasons for an elevation in CA 125 and again I truly believe that these masses do not represent a malignancy. However, after discussion they wish to have them surgically removed. We will  tentatively plan for surgery on December 9. We'll attempt a laparoscopic bilateral salpingo-oophorectomy. He we will be sent for frozen section. We discussed Gen. anesthesia and the need for her to spend the night in the hospital. They motivated to have the surgery done prior to the end of the calendar year due to insurance purposes. We will contact Dr. Zachary Hall her primary care physician to ensure that there is no perioperative concerns. She be seen by anesthesia for preoperative visit. She her daughters are instructed for her to stop the Voltaren one week prior surgery.  The questions were elicited in answer to their satisfaction. They will call us if they have any other questions prior to surgery  Rainelle Sulewski A., MD 10/21/2013, 1:01 PM  

## 2013-11-16 NOTE — Transfer of Care (Signed)
Immediate Anesthesia Transfer of Care Note  Patient: Felicia Harvey  Procedure(s) Performed: Procedure(s): LAPAROSCOPIC BILATERAL SALPINGO OOPHORECTOMY (Bilateral)  Patient Location: PACU  Anesthesia Type:General  Level of Consciousness: awake, alert  and oriented  Airway & Oxygen Therapy: Patient Spontanous Breathing and Patient connected to face mask oxygen  Post-op Assessment: Report given to PACU RN and Post -op Vital signs reviewed and stable  Post vital signs: Reviewed and stable  Complications: No apparent anesthesia complications

## 2013-11-16 NOTE — Interval H&P Note (Signed)
History and Physical Interval Note:  11/16/2013 7:54 AM  Felicia Harvey  has presented today for surgery, with the diagnosis of bilateral pelvic masses  The various methods of treatment have been discussed with the patient and family. After consideration of risks, benefits and other options for treatment, the patient has consented to  Procedure(s): LAPAROSCOPIC BILATERAL SALPINGO OOPHORECTOMY (Bilateral) as a surgical intervention .  The patient's history has been reviewed, patient examined, no change in status, stable for surgery.  I have reviewed the patient's chart and labs.  Questions were answered to the patient's satisfaction.     Lashane Whelpley A.

## 2013-11-16 NOTE — Op Note (Signed)
PATIENT: Felicia Harvey DATE OF BIRTH: [birth year] ENCOUNTER DATE: 11/16/2013   Preop Diagnosis: Bilateral adnexal masses, elevated CA 125  Postoperative Diagnosis: Right ovarian torsion and frozen section benign  Surgery: Laparoscopic bilateral salpingo-oophorectomy  Surgeons:  Colby Catanese A. Duard Brady, MD; Christin Bach, MD   Anesthesia: General   Estimated blood loss: 20 ml  IVF: 1500  ml   Urine output: 700 ml   Complications: None   Pathology: Peritoneal washings. bilateral tubes and ovaries  Operative findings: 8 cm right ovarian mass torsed x2. Frozen section revealed a hemorrhage. No viable epithelium. No evidence of carcinoma. Benign-appearing 3 cm left ovarian mass. Normal abdominal survey.  Procedure: The patient was identified in the preoperative holding area. Informed consent was signed on the chart. Patient was seen history was reviewed and exam was performed.   The patient was then taken to the operating room and placed in the supine position with SCD hose on. General anesthesia was then induced without difficulty. She was then placed in the dorsolithotomy position. The abdomen was prepped with chlor prep sponges per protocol. Perineum was prepped with Betadine. The vagina was prepped with Betadine a Foley catheter was inserted into the bladder under sterile conditions. Timeout was performed at this point to confirm the patient, procedure, antibiotic, allergy status. Dear speculum was placed in the vagina the cervix is visualized. There are no visible lesions on the cervix. The cervix was grasped with single-tooth tenaculum and dilated without difficulty and the ZUMI was placed.  The patient was then draped after the prep was dried. Timeout was performed the patient, procedure, antibiotic, allergy, and length of procedure. 1 cm infraumbilical incision was made with a knife and using a 5 mm operative intra-abdominal placement was achieved and confirmed. The abdomen was insufflated  with CO2 gas. This point all points during the case the abdominal pressure did not increased over 15 mm of mercury. The patient was then placed in Trendelenburg position. Bilateral 5 mm ports replaced 2 cm above and medial to the ACIS. A 10/12 port was placed the suprapubic position. Abdominal pelvic washings were obtained.  Attention was first drawn to the right ovary. There were adhesions secondary to the inflammation from the torsion of the ovary to the cul-de-sac. These were taken down with pinpoint cautery and dissection. The ovary was elevated from the pelvis. The retroperitoneum on the right side was entered using monopolar cautery. The ureter was identified. A window was made between the ureter and the infundibular pelvic vessels. The IP was coagulated and transected. We then skeletonized the utero-ovarian vessels. It was similarly coagulated and transected. And Endo catch bag was placed through the 10/12 port. It was placed in the Endo catch bag and delivered through the 10/12 suprapubic port site. It was deliveredwithout difficulty and without any spillage into the abdomen and sent for frozen section. A similar procedure was performed on the patient's left side. The abdomen pelvis were copiously irrigated. This point frozen section returned as benign. The trochars were removed. Anesthesia gave the patient deep breaths too help expel the intraabdominal CO2 gas. The 10/12 skin fascia was closed with a UR 6-0 Vicryl figure-of-eight suture and the skin was closed using 4 Vicryl. Steri-Strips and benzoin were applied. Sterile dressing was applied. The ZUMI and foley were both removed. There was a small non-bleeding abrasion at the introitus.  All instrument, suture, laparotomy, Ray-Tec, and needle counts were correct x2. The patient tolerated the procedure well and was taken recovery room in stable  condition. This is Felicia Harvey dictating an operative note on Felicia Harvey.

## 2013-11-17 ENCOUNTER — Encounter (HOSPITAL_COMMUNITY): Payer: Self-pay | Admitting: Gynecologic Oncology

## 2013-11-18 ENCOUNTER — Telehealth: Payer: Self-pay | Admitting: Gynecologic Oncology

## 2013-11-18 NOTE — Telephone Encounter (Signed)
Pt informed of surgical pathology.  Doing well post-operatively.  No concerns voiced.  Instructed to call for any needs or concerns.

## 2013-11-24 ENCOUNTER — Ambulatory Visit (INDEPENDENT_AMBULATORY_CARE_PROVIDER_SITE_OTHER): Payer: Medicare Other | Admitting: Obstetrics and Gynecology

## 2013-11-24 ENCOUNTER — Encounter: Payer: Self-pay | Admitting: Obstetrics and Gynecology

## 2013-11-24 VITALS — BP 100/60 | Ht 60.0 in | Wt 99.0 lb

## 2013-11-24 DIAGNOSIS — N839 Noninflammatory disorder of ovary, fallopian tube and broad ligament, unspecified: Secondary | ICD-10-CM

## 2013-11-24 DIAGNOSIS — N838 Other noninflammatory disorders of ovary, fallopian tube and broad ligament: Secondary | ICD-10-CM | POA: Insufficient documentation

## 2013-11-24 NOTE — Patient Instructions (Signed)
Thank your for lettingus care for you 

## 2013-11-24 NOTE — Progress Notes (Signed)
Patient ID: Felicia Harvey, female   DOB: 07-09-1929, 78 y.o.   MRN: [id number]   Subjective:  Felicia Harvey is a 79 y.o. female who presents to the clinic 1 weeks status post laparoscopic bilateral salpingoophorectomy for bilateral ovarian cysts, with infarcted right ovary cyst, and left cystadenofibroma.  She has been eating a regular diet without difficulty.  Bowel movements are normal. The patient is not having any pain.  Review of Systems Negative except as per HPI   Objective:  BP 100/60  Ht 5' (1.524 m)  Wt 99 lb (44.906 kg)  BMI 19.33 kg/m2 General:Well developed, well nourished.  No acute distress. Abdomen: Bowel sounds normal, soft, non-tender. Pelvic Exam: External Genitalia:  Normal. l Incision(s):Healing well, no drainage, no erythema, no hernia, no swelling, no dehiscence, incision well approximated.   Assessment:  Post-Op s/p laparoscopic bilateral salpingoophorectomy for ovarian masses, benign 1 weeks ago.   Doing well postoperatively.   Plan:  1. Continue any current medications. 2. Wound care discussed. 3. Activity restrictions: none 4. Anticipated return to work: not applicable. 5. Follow up in prn concerns. Marland Kitchen

## 2013-12-29 ENCOUNTER — Ambulatory Visit: Payer: Medicare Other | Admitting: Obstetrics and Gynecology

## 2014-01-12 DIAGNOSIS — E039 Hypothyroidism, unspecified: Secondary | ICD-10-CM | POA: Diagnosis not present

## 2014-01-12 DIAGNOSIS — I10 Essential (primary) hypertension: Secondary | ICD-10-CM | POA: Diagnosis not present

## 2014-01-12 DIAGNOSIS — M810 Age-related osteoporosis without current pathological fracture: Secondary | ICD-10-CM | POA: Diagnosis not present

## 2014-01-14 DIAGNOSIS — M810 Age-related osteoporosis without current pathological fracture: Secondary | ICD-10-CM | POA: Diagnosis not present

## 2014-01-14 DIAGNOSIS — L259 Unspecified contact dermatitis, unspecified cause: Secondary | ICD-10-CM | POA: Diagnosis not present

## 2014-01-14 DIAGNOSIS — E039 Hypothyroidism, unspecified: Secondary | ICD-10-CM | POA: Diagnosis not present

## 2014-01-14 DIAGNOSIS — I10 Essential (primary) hypertension: Secondary | ICD-10-CM | POA: Diagnosis not present

## 2014-02-03 ENCOUNTER — Encounter (HOSPITAL_COMMUNITY): Admission: RE | Admit: 2014-02-03 | Payer: Medicare Other | Source: Ambulatory Visit

## 2014-02-08 ENCOUNTER — Encounter (HOSPITAL_COMMUNITY)
Admission: RE | Admit: 2014-02-08 | Discharge: 2014-02-08 | Disposition: A | Discharge status: Home / Self Care | Payer: Medicare Other | Source: Ambulatory Visit | Attending: Internal Medicine | Admitting: Internal Medicine

## 2014-02-08 DIAGNOSIS — N839 Noninflammatory disorder of ovary, fallopian tube and broad ligament, unspecified: Secondary | ICD-10-CM | POA: Insufficient documentation

## 2014-02-08 DIAGNOSIS — K219 Gastro-esophageal reflux disease without esophagitis: Secondary | ICD-10-CM | POA: Diagnosis not present

## 2014-02-08 DIAGNOSIS — E039 Hypothyroidism, unspecified: Secondary | ICD-10-CM | POA: Diagnosis not present

## 2014-02-08 DIAGNOSIS — I10 Essential (primary) hypertension: Secondary | ICD-10-CM | POA: Diagnosis not present

## 2014-02-08 DIAGNOSIS — M949 Disorder of cartilage, unspecified: Secondary | ICD-10-CM | POA: Diagnosis not present

## 2014-02-08 DIAGNOSIS — M899 Disorder of bone, unspecified: Secondary | ICD-10-CM | POA: Insufficient documentation

## 2014-02-08 DIAGNOSIS — F209 Schizophrenia, unspecified: Secondary | ICD-10-CM | POA: Insufficient documentation

## 2014-02-08 DIAGNOSIS — E079 Disorder of thyroid, unspecified: Secondary | ICD-10-CM | POA: Diagnosis not present

## 2014-02-08 DIAGNOSIS — K900 Celiac disease: Secondary | ICD-10-CM | POA: Insufficient documentation

## 2014-02-08 DIAGNOSIS — F411 Generalized anxiety disorder: Secondary | ICD-10-CM | POA: Insufficient documentation

## 2014-02-08 LAB — COMPREHENSIVE METABOLIC PANEL
ALT: 11 U/L (ref 0–35)
AST: 23 U/L (ref 0–37)
Albumin: 3.7 g/dL (ref 3.5–5.2)
Alkaline Phosphatase: 54 U/L (ref 39–117)
BILIRUBIN TOTAL: 0.4 mg/dL (ref 0.3–1.2)
BUN: 11 mg/dL (ref 6–23)
CALCIUM: 9.1 mg/dL (ref 8.4–10.5)
CHLORIDE: 102 meq/L (ref 96–112)
CO2: 26 meq/L (ref 19–32)
CREATININE: 0.79 mg/dL (ref 0.50–1.10)
GFR, EST AFRICAN AMERICAN: 87 mL/min — AB (ref >90)
GFR, EST NON AFRICAN AMERICAN: 75 mL/min — AB (ref >90)
Glucose, Bld: 83 mg/dL (ref 70–99)
Potassium: 3.9 mEq/L (ref 3.7–5.3)
Sodium: 137 mEq/L (ref 137–147)
Total Protein: 6.9 g/dL (ref 6.0–8.3)

## 2014-02-08 MED ORDER — DENOSUMAB 60 MG/ML ~~LOC~~ SOLN
60.0000 mg | Freq: Once | SUBCUTANEOUS | Status: AC
  Administered 2014-02-08: 60 mg via SUBCUTANEOUS
  Filled 2014-02-08: qty 1

## 2014-03-31 ENCOUNTER — Other Ambulatory Visit: Payer: Self-pay

## 2014-04-04 MED ORDER — OMEPRAZOLE 20 MG PO CPDR: 20.0000 mg | DELAYED_RELEASE_CAPSULE | Freq: Every day | ORAL | Status: DC

## 2014-07-12 DIAGNOSIS — E039 Hypothyroidism, unspecified: Secondary | ICD-10-CM | POA: Diagnosis not present

## 2014-07-12 DIAGNOSIS — I10 Essential (primary) hypertension: Secondary | ICD-10-CM | POA: Diagnosis not present

## 2014-07-14 DIAGNOSIS — D509 Iron deficiency anemia, unspecified: Secondary | ICD-10-CM | POA: Diagnosis not present

## 2014-07-14 DIAGNOSIS — M810 Age-related osteoporosis without current pathological fracture: Secondary | ICD-10-CM | POA: Diagnosis not present

## 2014-07-14 DIAGNOSIS — I10 Essential (primary) hypertension: Secondary | ICD-10-CM | POA: Diagnosis not present

## 2014-07-14 DIAGNOSIS — E039 Hypothyroidism, unspecified: Secondary | ICD-10-CM | POA: Diagnosis not present

## 2014-07-18 DIAGNOSIS — Z1211 Encounter for screening for malignant neoplasm of colon: Secondary | ICD-10-CM | POA: Diagnosis not present

## 2014-08-11 ENCOUNTER — Encounter (HOSPITAL_COMMUNITY)
Admission: RE | Admit: 2014-08-11 | Discharge: 2014-08-11 | Disposition: A | Discharge status: Home / Self Care | Payer: Medicare Other | Source: Ambulatory Visit | Attending: Internal Medicine | Admitting: Internal Medicine

## 2014-08-11 DIAGNOSIS — M810 Age-related osteoporosis without current pathological fracture: Secondary | ICD-10-CM | POA: Diagnosis not present

## 2014-08-11 LAB — COMPREHENSIVE METABOLIC PANEL
ALK PHOS: 49 U/L (ref 39–117)
ALT: 9 U/L (ref 0–35)
ANION GAP: 11 (ref 5–15)
AST: 23 U/L (ref 0–37)
Albumin: 3.5 g/dL (ref 3.5–5.2)
BILIRUBIN TOTAL: 0.5 mg/dL (ref 0.3–1.2)
BUN: 7 mg/dL (ref 6–23)
CHLORIDE: 98 meq/L (ref 96–112)
CO2: 24 mEq/L (ref 19–32)
Calcium: 8.6 mg/dL (ref 8.4–10.5)
Creatinine, Ser: 0.77 mg/dL (ref 0.50–1.10)
GFR calc Af Amer: 88 mL/min — ABNORMAL LOW (ref >90)
GFR calc non Af Amer: 76 mL/min — ABNORMAL LOW (ref >90)
Glucose, Bld: 121 mg/dL — ABNORMAL HIGH (ref 70–99)
POTASSIUM: 4.4 meq/L (ref 3.7–5.3)
SODIUM: 133 meq/L — AB (ref 137–147)
TOTAL PROTEIN: 6.3 g/dL (ref 6.0–8.3)

## 2014-08-11 LAB — MAGNESIUM: Magnesium: 1.9 mg/dL (ref 1.5–2.5)

## 2014-08-11 MED ORDER — DENOSUMAB 60 MG/ML ~~LOC~~ SOLN
60.0000 mg | Freq: Once | SUBCUTANEOUS | Status: AC
  Administered 2014-08-11: 60 mg via SUBCUTANEOUS
  Filled 2014-08-11: qty 1

## 2014-08-11 NOTE — Progress Notes (Signed)
Lab results faxed to Delphina Cahill, MD

## 2014-08-16 DIAGNOSIS — D509 Iron deficiency anemia, unspecified: Secondary | ICD-10-CM | POA: Diagnosis not present

## 2014-08-16 DIAGNOSIS — D500 Iron deficiency anemia secondary to blood loss (chronic): Secondary | ICD-10-CM | POA: Diagnosis not present

## 2014-09-21 ENCOUNTER — Encounter: Payer: Self-pay | Admitting: Internal Medicine

## 2014-11-25 ENCOUNTER — Ambulatory Visit (INDEPENDENT_AMBULATORY_CARE_PROVIDER_SITE_OTHER): Payer: Medicare Other | Admitting: Internal Medicine

## 2014-11-25 ENCOUNTER — Encounter: Payer: Self-pay | Admitting: Internal Medicine

## 2014-11-25 ENCOUNTER — Other Ambulatory Visit: Payer: Self-pay

## 2014-11-25 ENCOUNTER — Telehealth: Payer: Self-pay

## 2014-11-25 VITALS — BP 132/80 | HR 76 | Temp 97.9°F | Ht 61.0 in | Wt 101.2 lb

## 2014-11-25 DIAGNOSIS — K900 Celiac disease: Secondary | ICD-10-CM | POA: Diagnosis not present

## 2014-11-25 DIAGNOSIS — R1314 Dysphagia, pharyngoesophageal phase: Secondary | ICD-10-CM | POA: Diagnosis not present

## 2014-11-25 DIAGNOSIS — K219 Gastro-esophageal reflux disease without esophagitis: Secondary | ICD-10-CM

## 2014-11-25 MED ORDER — OMEPRAZOLE 20 MG PO CPDR: 20.0000 mg | DELAYED_RELEASE_CAPSULE | Freq: Two times a day (BID) | ORAL | Status: DC

## 2014-11-25 NOTE — Telephone Encounter (Signed)
Called Son to notify him about mothers appt. She is scheduled for a BPE on 12/05/2014 @ 845 am. Nothing to eat or drink after midnight the night before.

## 2014-11-25 NOTE — Patient Instructions (Signed)
Barium pill esophogram to evaluate dysphagia  Increase Prilosec to 20 mg twice daily (will send prescription for 60 pills with 11 refills to your pharmacy)  Continue Gluten free diet  Further recommendations to follow.

## 2014-11-25 NOTE — Progress Notes (Signed)
Primary Care Physician:  Delphina Cahill, MD Primary Gastroenterologist:  Dr. Gala Romney  Pre-Procedure History & Physical: HPI:  Felicia Harvey is a 80 y.o. female here for follow-up GERD and celiac disease. Doing well on a gluten-free diet. Having some breakthrough GERD symptoms 2-3 times weekly in spite of taking Prilosec 20 mg once daily. No longer on oral Fosamax (takes a parenteral bisphosphonate). Intermittent difficulties with the swallowing bread and meat. Status post dilation of a noncritical Schatzki's ring 2 years ago. Patient states it helped considerably. Her dentition is very poor. She got false teeth. Did not like the fit. Does not wear them. Has just a few of her natural teeth remaining. Hasn't had any abdominal pain, melena or hematochezia. History of a small Zenker's diverticulum.  Weight actually up 3 pounds.  Past Medical History  Diagnosis Date  . Thyroid disease   . HTN (hypertension)   . Anxiety   . GERD (gastroesophageal reflux disease)   . Hypothyroidism   . Dysphagia 09/21/2011  . Chest pain 09/21/2011    OCCASIONAL  . Polydipsia 09/21/2011  . Celiac disease   . Knee fracture, left 03/2012  . Dysrhythmia     "heart arrthymia" per son, but unsure what kind  . Osteopenia   . Difficulty sleeping     TAKES MED TO SLEEP  . Schizophrenia     FAMILY UNSURE OF THIS  . 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 Ovarian mass   . Constipation     Past Surgical History  Procedure Laterality Date  . Left shoulder    . Thyroidectomy    . Vertebroplasty    . Left hip replacement    . Esophagogastroduodenoscopy  09/27/2011    Inflammatory changes midesophagus most likely pill-induced injury (Fosamax), large diaphragmatic hernia, biopsies from small bowel consistent with celiac disease  . Esophagogastroduodenoscopy (egd) with esophageal dilation N/A 07/13/2013    [id number] esophagitis-more likely pill-induced s/p esophageal biopsy. Noncritical. Schatzki's ring s/p passage of a  Maloney dilator. Hiatal hernia. Abnormal duodenum consistent with prior diagnosis of celiac disease. Status post 57 French Maloney dilator. Esophageal biopsies benign showed marked acute and chronic inflammation.  . Laparoscopic salpingo oopherectomy Bilateral 11/16/2013    Procedure: LAPAROSCOPIC BILATERAL SALPINGO OOPHORECTOMY;  Surgeon: Imagene Gurney A. Alycia Rossetti, MD;  Location: [facility];  Service: Gynecology;  Laterality: Bilateral;    Prior to Admission medications   Medication Sig Start Date End Date Taking? Authorizing Provider  acetaminophen (TYLENOL) 500 MG tablet Take 325 mg by mouth every 6 (six) hours as needed for pain.    Yes Historical Provider, MD  diclofenac (VOLTAREN) 50 MG EC tablet Take 50 mg by mouth daily.    Yes Historical Provider, MD  Iron-FA-B Cmp-C-Biot-Probiotic (FUSION PLUS PO) Take by mouth daily.   Yes Historical Provider, MD  levothyroxine (SYNTHROID, LEVOTHROID) 75 MCG tablet Take 75 mcg by mouth every morning.   Yes Historical Provider, MD  lisinopril (PRINIVIL,ZESTRIL) 40 MG tablet Take 40 mg by mouth every morning.    Yes Historical Provider, MD  omeprazole (PRILOSEC) 20 MG capsule Take 1 capsule (20 mg total) by mouth daily.   Yes Mahala Menghini, PA-C  QUEtiapine (SEROQUEL) 100 MG tablet Take 100 mg by mouth at bedtime.     Yes Historical Provider, MD  acetaminophen-codeine (TYLENOL #3) 300-30 MG per tablet Take 1 tablet by mouth every 4 (four) hours as needed for moderate pain. Patient not taking: Reported on 11/25/2014 11/16/13   Paola A. Alycia Rossetti, MD  bisacodyl (DULCOLAX) 5 MG EC tablet Take 5-10 mg by mouth daily as needed for constipation.    Historical Provider, MD  calcium carbonate (OS-CAL - DOSED IN MG OF ELEMENTAL CALCIUM) 1250 MG tablet Take 1 tablet by mouth every morning.    Historical Provider, MD  denosumab (PROLIA) 60 MG/ML SOLN injection Inject 60 mg into the skin every 6 (six) months. Administer in upper arm, thigh, or abdomen    Historical Provider, MD    docusate sodium (COLACE) 100 MG capsule Take 200 mg by mouth daily as needed for constipation.    Historical Provider, MD  polyethylene glycol (MIRALAX / GLYCOLAX) packet Take 17 g by mouth daily. Patient not taking: Reported on 11/25/2014 08/25/13   Ephraim Hamburger, MD    Allergies as of 11/25/2014 - Review Complete 11/25/2014  Allergen Reaction Noted  . Gluten  09/20/2011    Family History  Problem Relation Age of Onset  . Cancer Sister     brain tumor  . Hypertension Brother   . Hypertension Daughter   . Kidney disease Sister     History   Social History  . Marital Status: Divorced    Spouse Name: N/A    Number of Children: N/A  . Years of Education: 12th   Occupational History  . Retired    Social History Main Topics  . Smoking status: Never Smoker   . Smokeless tobacco: Never Used  . Alcohol Use: No     Comment: rarely  . Drug Use: No  . Sexual Activity: Not Currently    Birth Control/ Protection: Post-menopausal   Other Topics Concern  . Not on file   Social History Narrative    Review of Systems: See HPI, otherwise negative ROS  Physical Exam: BP 132/80 mmHg  Pulse 76  Temp(Src) 97.9 F (36.6 C) (Oral)  Ht 5\' 1"  (1.549 m)  Wt 101 lb 3.2 oz (45.904 kg)  BMI 19.13 kg/m2 General:   Alert,  Hard of hearing, frail lady resting comfortably. She is accompanied by her son. Skin:  Intact without significant lesions or rashes. Eyes:  Sclera clear, no icterus.   Conjunctiva pink. Ears:  Diminished hearing. Nose:  No deformity, discharge,  or lesions. Mouth:  No deformity or lesions.  poor dentition. Neck:  Supple; no masses or thyromegaly. No significant cervical adenopathy. Lungs:  Clear throughout to auscultation.   No wheezes, crackles, or rhonchi. No acute distress. Heart:  Regular rate and rhythm; no murmurs, clicks, rubs,  or gallops. Abdomen: Non-distended, normal bowel sounds.  Soft and nontender without appreciable mass or hepatosplenomegaly.   Pulses:  Normal pulses noted. Extremities:  Without clubbing or edema.  Impression:   80 year old lady with celiac disease doing well on a gluten free diet with recurrent intermittent esophageal dysphagia. Known Schatzki's ring. Reflux symptoms breaking through once daily Prilosec. History of a small Zenker's diverticulum. Poor dentition likely contributing to dysphagia. I'm glad to see she's not taking any oral bisphosphonate therapy.  Recommendations:   Increase Prilosec to 20 mg twice daily (I feel the benefits outweigh the risks).   Proceed with a barium pill esophagram to further evaluate dysphagia. Continue gluten-free diet. Getting better fitting dentures would be of great benefit to assist with chewing/swallowing process. Swallowing precautions reviewed. Further recommendations to follow.   Notice: This dictation was prepared with Dragon dictation along with smaller phrase technology. Any transcriptional errors that result from this process are unintentional and may not be corrected upon review.

## 2014-11-28 NOTE — Telephone Encounter (Signed)
Spoke with son. He is aware of appt for his mother.

## 2014-12-05 ENCOUNTER — Ambulatory Visit (HOSPITAL_COMMUNITY)
Admission: RE | Admit: 2014-12-05 | Discharge: 2014-12-05 | Disposition: A | Discharge status: Home / Self Care | Payer: Medicare Other | Source: Ambulatory Visit | Attending: Internal Medicine | Admitting: Internal Medicine

## 2014-12-05 DIAGNOSIS — K224 Dyskinesia of esophagus: Secondary | ICD-10-CM | POA: Insufficient documentation

## 2014-12-05 DIAGNOSIS — R1310 Dysphagia, unspecified: Secondary | ICD-10-CM | POA: Insufficient documentation

## 2014-12-05 DIAGNOSIS — R112 Nausea with vomiting, unspecified: Secondary | ICD-10-CM | POA: Insufficient documentation

## 2014-12-05 DIAGNOSIS — K219 Gastro-esophageal reflux disease without esophagitis: Secondary | ICD-10-CM | POA: Diagnosis not present

## 2014-12-05 DIAGNOSIS — R1314 Dysphagia, pharyngoesophageal phase: Secondary | ICD-10-CM

## 2014-12-07 NOTE — Progress Notes (Signed)
ON RECALL LIST  °

## 2015-01-03 DIAGNOSIS — I10 Essential (primary) hypertension: Secondary | ICD-10-CM | POA: Diagnosis not present

## 2015-01-03 DIAGNOSIS — E039 Hypothyroidism, unspecified: Secondary | ICD-10-CM | POA: Diagnosis not present

## 2015-01-05 DIAGNOSIS — M818 Other osteoporosis without current pathological fracture: Secondary | ICD-10-CM | POA: Diagnosis not present

## 2015-01-05 DIAGNOSIS — D509 Iron deficiency anemia, unspecified: Secondary | ICD-10-CM | POA: Diagnosis not present

## 2015-01-05 DIAGNOSIS — E039 Hypothyroidism, unspecified: Secondary | ICD-10-CM | POA: Diagnosis not present

## 2015-01-05 DIAGNOSIS — E782 Mixed hyperlipidemia: Secondary | ICD-10-CM | POA: Diagnosis not present

## 2015-02-09 ENCOUNTER — Encounter (HOSPITAL_COMMUNITY)
Admission: RE | Admit: 2015-02-09 | Discharge: 2015-02-09 | Disposition: A | Discharge status: Home / Self Care | Payer: Medicare Other | Source: Ambulatory Visit | Attending: Internal Medicine | Admitting: Internal Medicine

## 2015-02-09 ENCOUNTER — Encounter: Payer: Self-pay | Admitting: Internal Medicine

## 2015-02-09 DIAGNOSIS — M810 Age-related osteoporosis without current pathological fracture: Secondary | ICD-10-CM | POA: Insufficient documentation

## 2015-02-09 LAB — COMPREHENSIVE METABOLIC PANEL
ALBUMIN: 4 g/dL (ref 3.5–5.2)
ALT: 17 U/L (ref 0–35)
AST: 31 U/L (ref 0–37)
Alkaline Phosphatase: 41 U/L (ref 39–117)
Anion gap: 8 (ref 5–15)
BUN: 8 mg/dL (ref 6–23)
CO2: 23 mmol/L (ref 19–32)
Calcium: 8.9 mg/dL (ref 8.4–10.5)
Chloride: 103 mmol/L (ref 96–112)
Creatinine, Ser: 0.74 mg/dL (ref 0.50–1.10)
GFR calc Af Amer: 89 mL/min — ABNORMAL LOW (ref >90)
GFR calc non Af Amer: 77 mL/min — ABNORMAL LOW (ref >90)
GLUCOSE: 87 mg/dL (ref 70–99)
Potassium: 3.7 mmol/L (ref 3.5–5.1)
SODIUM: 134 mmol/L — AB (ref 135–145)
TOTAL PROTEIN: 6.6 g/dL (ref 6.0–8.3)
Total Bilirubin: 0.5 mg/dL (ref 0.3–1.2)

## 2015-02-09 LAB — MAGNESIUM: Magnesium: 2 mg/dL (ref 1.5–2.5)

## 2015-02-09 LAB — PHOSPHORUS: Phosphorus: 3.7 mg/dL (ref 2.3–4.6)

## 2015-02-09 MED ORDER — DENOSUMAB 60 MG/ML ~~LOC~~ SOLN
60.0000 mg | Freq: Once | SUBCUTANEOUS | Status: AC
  Administered 2015-02-09: 60 mg via SUBCUTANEOUS
  Filled 2015-02-09: qty 1

## 2015-02-09 NOTE — Progress Notes (Signed)
Results for Felicia Harvey, Felicia Harvey (MRN [id number]) as of 02/09/2015 11:03  Ref. Range 02/09/2015 09:00  Sodium Latest Range: 135-145 mmol/L 134 (L)  Potassium Latest Range: 3.5-5.1 mmol/L 3.7  Chloride Latest Range: 96-112 mmol/L 103  CO2 Latest Range: 19-32 mmol/L 23  BUN Latest Range: 6-23 mg/dL 8  Creatinine Latest Range: 0.50-1.10 mg/dL 0.74  Calcium Latest Range: 8.4-10.5 mg/dL 8.9  GFR calc non Af Amer Latest Range: >90 mL/min 77 (L)  GFR calc Af Amer Latest Range: >90 mL/min 89 (L)  Glucose Latest Range: 70-99 mg/dL 87  Anion gap Latest Range: 5-15  8  Phosphorus Latest Range: 2.3-4.6 mg/dL 3.7  Magnesium Latest Range: 1.5-2.5 mg/dL 2.0  Alkaline Phosphatase Latest Range: 39-117 U/L 41  Albumin Latest Range: 3.5-5.2 g/dL 4.0  AST Latest Range: 0-37 U/L 31  ALT Latest Range: 0-35 U/L 17  Total Protein Latest Range: 6.0-8.3 g/dL 6.6  Total Bilirubin Latest Range: 0.3-1.2 mg/dL 0.5  Prolia given this clinic visit

## 2015-03-07 ENCOUNTER — Ambulatory Visit (INDEPENDENT_AMBULATORY_CARE_PROVIDER_SITE_OTHER): Payer: Medicare Other | Admitting: Gastroenterology

## 2015-03-07 ENCOUNTER — Encounter: Payer: Self-pay | Admitting: Gastroenterology

## 2015-03-07 VITALS — BP 166/90 | HR 63 | Temp 97.0°F | Ht 61.0 in | Wt 101.4 lb

## 2015-03-07 DIAGNOSIS — K219 Gastro-esophageal reflux disease without esophagitis: Secondary | ICD-10-CM | POA: Diagnosis not present

## 2015-03-07 DIAGNOSIS — R1310 Dysphagia, unspecified: Secondary | ICD-10-CM

## 2015-03-07 DIAGNOSIS — K900 Celiac disease: Secondary | ICD-10-CM | POA: Diagnosis not present

## 2015-03-07 NOTE — Progress Notes (Signed)
Primary Care Physician:  Delphina Cahill, MD  Primary Gastroenterologist:  Garfield Cornea, MD   Chief Complaint  Patient presents with  . Follow-up    HPI:  Felicia Harvey is a 81 y.o. female here for follow-up of GERD and celiac disease. Last seen in December 2015 by Dr. Gala Romney. At the time was having breakthrough GERD symptoms about 2-3 times per week, Prilosec was increased from 20 mg once daily to twice daily. She has a history of small Zenker's diverticulum. Status post dilation of noncritical Schatzki ring couple of years ago. History of pill-induced esophageal injury due to Fosamax previously, no longer on oral medication.  Patient reports that she is doing very well. She maintains a gluten-free diet regularly. Denies any constipation, diarrhea, melena, rectal bleeding, abdominal pain. Denies any dysphagia, heartburn. Uses MiraLAX, Colace, Dulcolax as needed only.   Current Outpatient Prescriptions  Medication Sig Dispense Refill  . acetaminophen (TYLENOL) 500 MG tablet Take 325 mg by mouth every 6 (six) hours as needed for pain.     . bisacodyl (DULCOLAX) 5 MG EC tablet Take 5-10 mg by mouth daily as needed for constipation.    . calcium carbonate (OS-CAL - DOSED IN MG OF ELEMENTAL CALCIUM) 1250 MG tablet Take 1 tablet by mouth every morning.    . denosumab (PROLIA) 60 MG/ML SOLN injection Inject 60 mg into the skin every 6 (six) months. Administer in upper arm, thigh, or abdomen    . diclofenac (VOLTAREN) 50 MG EC tablet Take 50 mg by mouth daily.     Marland Kitchen docusate sodium (COLACE) 100 MG capsule Take 200 mg by mouth daily as needed for constipation.    . Iron-FA-B Cmp-C-Biot-Probiotic (FUSION PLUS PO) Take by mouth daily.    Marland Kitchen levothyroxine (SYNTHROID, LEVOTHROID) 75 MCG tablet Take 75 mcg by mouth every morning.    Marland Kitchen lisinopril (PRINIVIL,ZESTRIL) 40 MG tablet Take 40 mg by mouth every morning.     Marland Kitchen omeprazole (PRILOSEC) 20 MG capsule Take 1 capsule (20 mg total) by mouth 2 (two) times  daily. 60 capsule 11  . polyethylene glycol (MIRALAX / GLYCOLAX) packet Take 17 g by mouth daily. 14 each 0  . QUEtiapine (SEROQUEL) 100 MG tablet Take 100 mg by mouth at bedtime.       No current facility-administered medications for this visit.    Allergies as of 03/07/2015 - Review Complete 03/07/2015  Allergen Reaction Noted  . Gluten  09/20/2011    Past Medical History  Diagnosis Date  . Thyroid disease   . HTN (hypertension)   . Anxiety   . GERD (gastroesophageal reflux disease)   . Hypothyroidism   . Dysphagia 09/21/2011  . Chest pain 09/21/2011    OCCASIONAL  . Polydipsia 09/21/2011  . Celiac disease   . Knee fracture, left 03/2012  . Dysrhythmia     "heart arrthymia" per son, but unsure what kind  . Osteopenia   . Difficulty sleeping     TAKES MED TO SLEEP  . Schizophrenia     FAMILY UNSURE OF THIS  . 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 Ovarian mass   . Constipation     Past Surgical History  Procedure Laterality Date  . Left shoulder    . Thyroidectomy    . Vertebroplasty    . Left hip replacement    . Esophagogastroduodenoscopy  09/27/2011    Inflammatory changes midesophagus most likely pill-induced injury (Fosamax), large diaphragmatic hernia, biopsies from small bowel consistent with celiac disease  .  Esophagogastroduodenoscopy (egd) with esophageal dilation N/A 07/13/2013    [id number] esophagitis-more likely pill-induced s/p esophageal biopsy. Noncritical. Schatzki's ring s/p passage of a Maloney dilator. Hiatal hernia. Abnormal duodenum consistent with prior diagnosis of celiac disease. Status post 73 French Maloney dilator. Esophageal biopsies benign showed marked acute and chronic inflammation.  . Laparoscopic salpingo oopherectomy Bilateral 11/16/2013    Procedure: LAPAROSCOPIC BILATERAL SALPINGO OOPHORECTOMY;  Surgeon: Imagene Gurney A. Alycia Rossetti, MD;  Location: [facility];  Service: Gynecology;  Laterality: Bilateral;    Family History  Problem Relation Age of  Onset  . Cancer Sister     brain tumor  . Hypertension Brother   . Hypertension Daughter   . Kidney disease Sister     History   Social History  . Marital Status: Divorced    Spouse Name: N/A  . Number of Children: N/A  . Years of Education: 12th   Occupational History  . Retired    Social History Main Topics  . Smoking status: Never Smoker   . Smokeless tobacco: Never Used  . Alcohol Use: No     Comment: rarely  . Drug Use: No  . Sexual Activity: Not Currently    Birth Control/ Protection: Post-menopausal   Other Topics Concern  . Not on file   Social History Narrative      ROS: Gives negative review of systems as outlined below.  General: Negative for anorexia, weight loss, fever, chills, fatigue, weakness. Eyes: Negative for vision changes.  ENT: Negative for hoarseness, difficulty swallowing , nasal congestion. CV: Negative for chest pain, angina, palpitations, dyspnea on exertion, peripheral edema.  Respiratory: Negative for dyspnea at rest, dyspnea on exertion, cough, sputum, wheezing.  GI: See history of present illness. GU:  Negative for dysuria, hematuria, urinary incontinence, urinary frequency, nocturnal urination.  MS: Negative for joint pain, low back pain.  Derm: Negative for rash or itching.  Neuro: Negative for weakness, abnormal sensation, seizure, frequent headaches, memory loss, confusion.  Psych: Negative for anxiety, depression, suicidal ideation, hallucinations.  Endo: Negative for unusual weight change.  Heme: Negative for bruising or bleeding. Allergy: Negative for rash or hives.    Physical Examination:  BP 166/90 mmHg  Pulse 63  Temp(Src) 97 F (36.1 C)  Ht 5\' 1"  (1.549 m)  Wt 101 lb 6.4 oz (45.995 kg)  BMI 19.17 kg/m2   General: Thin elderly, well-developed in no acute distress.  Head: Normocephalic, atraumatic.   Eyes: Conjunctiva pink, no icterus. Mouth: Oropharyngeal mucosa moist and pink , no lesions erythema or  exudate. Neck: Supple without thyromegaly, masses, or lymphadenopathy.  Lungs: Clear to auscultation bilaterally.  Heart: Regular rate and rhythm, no murmurs rubs or gallops.  Abdomen: Bowel sounds are normal, nontender, nondistended, no hepatosplenomegaly or masses, no abdominal bruits or    hernia , no rebound or guarding.   Rectal: Not performed Extremities: No lower extremity edema. No clubbing or deformities.  Neuro: Alert and oriented x 4 , grossly normal neurologically.  Skin: Warm and dry, no rash or jaundice.   Psych: Alert and cooperative, normal mood and affect.  Labs: Lab Results  Component Value Date   ALT 17 02/09/2015   AST 31 02/09/2015   ALKPHOS 41 02/09/2015   BILITOT 0.5 02/09/2015   Lab Results  Component Value Date   CREATININE 0.74 02/09/2015   BUN 8 02/09/2015   NA 134* 02/09/2015   K 3.7 02/09/2015   CL 103 02/09/2015   CO2 23 02/09/2015   02/09/2015, magnesium level 2.0  Imaging Studies: No results found.

## 2015-03-07 NOTE — Patient Instructions (Signed)
1. You can try decreasing omeprazole to once daily before breakfast for your acid reflux BUT if you start having heartburn again, you should go back to twice daily before breakfast and evening meal. 2. Return to the office in six months.

## 2015-03-09 NOTE — Progress Notes (Signed)
cc'ed to pcp °

## 2015-03-09 NOTE — Assessment & Plan Note (Addendum)
Continue gluten-free diet. Continue supplements and treatment for osteoporosis. Return to the office in 6 months or sooner if needed.

## 2015-03-09 NOTE — Assessment & Plan Note (Signed)
Doing well from the standpoint of acid reflux and dysphagia. She may try decreasing Prilosec to once daily but if she has recurrent symptoms she will require twice a day dosing. Return to the office in 6 months or sooner if needed.

## 2015-06-07 DIAGNOSIS — F209 Schizophrenia, unspecified: Secondary | ICD-10-CM | POA: Diagnosis not present

## 2015-06-07 DIAGNOSIS — F339 Major depressive disorder, recurrent, unspecified: Secondary | ICD-10-CM | POA: Diagnosis not present

## 2015-07-04 DIAGNOSIS — I10 Essential (primary) hypertension: Secondary | ICD-10-CM | POA: Diagnosis not present

## 2015-07-04 DIAGNOSIS — E039 Hypothyroidism, unspecified: Secondary | ICD-10-CM | POA: Diagnosis not present

## 2015-07-04 DIAGNOSIS — E782 Mixed hyperlipidemia: Secondary | ICD-10-CM | POA: Diagnosis not present

## 2015-07-06 DIAGNOSIS — G4700 Insomnia, unspecified: Secondary | ICD-10-CM | POA: Diagnosis not present

## 2015-07-06 DIAGNOSIS — E785 Hyperlipidemia, unspecified: Secondary | ICD-10-CM | POA: Diagnosis not present

## 2015-07-06 DIAGNOSIS — E039 Hypothyroidism, unspecified: Secondary | ICD-10-CM | POA: Diagnosis not present

## 2015-07-06 DIAGNOSIS — I10 Essential (primary) hypertension: Secondary | ICD-10-CM | POA: Diagnosis not present

## 2015-07-11 ENCOUNTER — Encounter: Payer: Self-pay | Admitting: Internal Medicine

## 2015-07-12 DIAGNOSIS — F209 Schizophrenia, unspecified: Secondary | ICD-10-CM | POA: Diagnosis not present

## 2015-08-09 DIAGNOSIS — K5900 Constipation, unspecified: Secondary | ICD-10-CM | POA: Diagnosis not present

## 2015-08-15 ENCOUNTER — Encounter (HOSPITAL_COMMUNITY)
Admission: RE | Admit: 2015-08-15 | Discharge: 2015-08-15 | Disposition: A | Discharge status: Home / Self Care | Payer: Medicare Other | Source: Ambulatory Visit | Attending: Internal Medicine | Admitting: Internal Medicine

## 2015-08-15 DIAGNOSIS — M810 Age-related osteoporosis without current pathological fracture: Secondary | ICD-10-CM | POA: Insufficient documentation

## 2015-08-15 MED ORDER — DENOSUMAB 60 MG/ML ~~LOC~~ SOLN
60.0000 mg | Freq: Once | SUBCUTANEOUS | Status: AC
  Administered 2015-08-15: 60 mg via SUBCUTANEOUS
  Filled 2015-08-15: qty 1

## 2015-08-18 ENCOUNTER — Encounter: Payer: Self-pay | Admitting: Gastroenterology

## 2015-08-18 ENCOUNTER — Ambulatory Visit (INDEPENDENT_AMBULATORY_CARE_PROVIDER_SITE_OTHER): Payer: Medicare Other | Admitting: Gastroenterology

## 2015-08-18 VITALS — BP 116/76 | HR 79 | Temp 97.9°F | Ht 63.0 in | Wt 97.8 lb

## 2015-08-18 DIAGNOSIS — K219 Gastro-esophageal reflux disease without esophagitis: Secondary | ICD-10-CM | POA: Diagnosis not present

## 2015-08-18 DIAGNOSIS — R1310 Dysphagia, unspecified: Secondary | ICD-10-CM

## 2015-08-18 DIAGNOSIS — K5900 Constipation, unspecified: Secondary | ICD-10-CM

## 2015-08-18 DIAGNOSIS — K900 Celiac disease: Secondary | ICD-10-CM | POA: Diagnosis not present

## 2015-08-18 NOTE — Progress Notes (Signed)
Primary Care Physician: Delphina Cahill, MD  Primary Gastroenterologist:  Garfield Cornea, MD   Chief Complaint  Patient presents with  . Follow-up    HPI: Felicia Harvey is a 81 y.o. female here for follow-up of GERD and celiac disease. Last seen in March 2016. She also has a history of small Zenker's diverticulum seen on upper GI series in July 2014. History of pill induced esophageal injury due to Fosamax as well as dilation of a noncritical Schatzki ring back in 2014. Barium pill esophagram December 2015 showed nonspecific esophageal motility disorder with occasional tertiary contraction, GERD but no stricture.  No prior colonoscopy to patient's knowledge.  Presents with her son today. She feels well. Very vague esophageal dysphagia intermittently but not frequently. No difficulty swallowing pills. No odynophagia. Appetite is good. Denies abdominal pain. Recently had some issues with constipation but this is much better on MiraLAX once daily when necessary. Some tendency towards looser stools now. Denies blood in the stool or melena. Weight has been stable. Not interested in pursuing colonoscopy.  Current Outpatient Prescriptions  Medication Sig Dispense Refill  . acetaminophen (TYLENOL) 500 MG tablet Take 325 mg by mouth every 6 (six) hours as needed for pain.     . calcium carbonate (OS-CAL - DOSED IN MG OF ELEMENTAL CALCIUM) 1250 MG tablet Take 1 tablet by mouth every morning.    . denosumab (PROLIA) 60 MG/ML SOLN injection Inject 60 mg into the skin every 6 (six) months. Administer in upper arm, thigh, or abdomen    . diclofenac (VOLTAREN) 50 MG EC tablet Take 50 mg by mouth daily.     . Iron-FA-B Cmp-C-Biot-Probiotic (FUSION PLUS PO) Take by mouth daily.    Marland Kitchen levothyroxine (SYNTHROID, LEVOTHROID) 75 MCG tablet Take 75 mcg by mouth every morning.    Marland Kitchen lisinopril (PRINIVIL,ZESTRIL) 40 MG tablet Take 40 mg by mouth every morning.     Marland Kitchen omeprazole (PRILOSEC) 20 MG capsule Take 1  capsule (20 mg total) by mouth 2 (two) times daily. 60 capsule 11  . polyethylene glycol (MIRALAX / GLYCOLAX) packet Take 17 g by mouth daily. 14 each 0  . QUEtiapine (SEROQUEL) 100 MG tablet Take 100 mg by mouth at bedtime.       No current facility-administered medications for this visit.    Allergies as of 08/18/2015 - Review Complete 08/18/2015  Allergen Reaction Noted  . Gluten  09/20/2011    ROS:  General: Negative for anorexia, weight loss, fever, chills, fatigue, weakness. ENT: Negative for hoarseness, nasal congestion. See history of present illness CV: Negative for chest pain, angina, palpitations, dyspnea on exertion, peripheral edema.  Respiratory: Negative for dyspnea at rest, dyspnea on exertion, cough, sputum, wheezing.  GI: See history of present illness. GU:  Negative for dysuria, hematuria, urinary incontinence, urinary frequency, nocturnal urination.  Endo: Negative for unusual weight change.    Physical Examination:   BP 116/76 mmHg  Pulse 79  Temp(Src) 97.9 F (36.6 C) (Oral)  Ht 5\' 3"  (1.6 m)  Wt 97 lb 12.8 oz (44.362 kg)  BMI 17.33 kg/m2  General: Well-nourished, well-developed in no acute distress. Accompanied by son. Eyes: No icterus. Mouth: Oropharyngeal mucosa moist and pink , no lesions erythema or exudate. Lungs: Clear to auscultation bilaterally.  Heart: Regular rate and rhythm, no murmurs rubs or gallops.  Abdomen: Bowel sounds are normal, nontender, nondistended, no hepatosplenomegaly or masses, no abdominal bruits or hernia , no rebound or guarding.   Extremities:  No lower extremity edema. No clubbing or deformities. Neuro: Alert and oriented x 4   Skin: Warm and dry, no jaundice.   Psych: Alert and cooperative, normal mood and affect.  Labs:  Lab Results  Component Value Date   CREATININE 0.74 02/09/2015   BUN 8 02/09/2015   NA 134* 02/09/2015   K 3.7 02/09/2015   CL 103 02/09/2015   CO2 23 02/09/2015   Lab Results  Component  Value Date   ALT 17 02/09/2015   AST 31 02/09/2015   ALKPHOS 41 02/09/2015   BILITOT 0.5 02/09/2015     Imaging Studies: No results found.

## 2015-08-18 NOTE — Assessment & Plan Note (Signed)
Continue gluten-free diet. Continue supplements. Return to the office in 6 months or sooner if needed.

## 2015-08-18 NOTE — Patient Instructions (Signed)
1. If your bowels are too frequent or too loose, decrease Miralax to 1/2 capful daily as needed. 2. Call if you have worsening swallowing problems or blood in your stool or more difficult to manage constipation. 3. Return to the office in six months.

## 2015-08-18 NOTE — Assessment & Plan Note (Signed)
Titrate MiraLAX dose as needed. Take half to one capful daily as needed for management of constipation. Call if you have any questions, concerns, developed blood in the stool or worsening constipation.

## 2015-08-18 NOTE — Assessment & Plan Note (Signed)
Vague intermittent symptoms in the setting of tiny Zenker's diverticulum, esophageal motility disorder, history of noncritical Schatzki ring. Doubt significant esophageal stricture at this time. Discussed at length with patient and son. They plan to monitor her symptoms and let me know if anything worsens. At that time would consider repeat barium pill esophagram as an initial step.

## 2015-08-21 NOTE — Progress Notes (Signed)
cc'ed to pcp °

## 2015-12-04 ENCOUNTER — Other Ambulatory Visit: Payer: Self-pay | Admitting: Gastroenterology

## 2015-12-06 ENCOUNTER — Telehealth: Payer: Self-pay | Admitting: Gastroenterology

## 2015-12-06 NOTE — Telephone Encounter (Signed)
Patient stating that [facility] for omeprazole refill a few times and has not had a response from [place]

## 2015-12-06 NOTE — Telephone Encounter (Signed)
I informed the pt the refill request is in the refill box. We are a little behind because of the holidays.  I gave her #6 Prilosec to have on hand til we get the refill sent in.

## 2015-12-06 NOTE — Telephone Encounter (Signed)
Routing to Anna Sams, NP. 

## 2015-12-06 NOTE — Telephone Encounter (Signed)
Please route to responsible provider who is doing refills today. Thanks!

## 2015-12-06 NOTE — Telephone Encounter (Signed)
Completed.

## 2015-12-08 ENCOUNTER — Encounter (HOSPITAL_COMMUNITY): Payer: Self-pay | Admitting: Cardiology

## 2015-12-08 ENCOUNTER — Emergency Department (HOSPITAL_COMMUNITY)
Admission: EM | Admit: 2015-12-08 | Discharge: 2015-12-08 | Disposition: A | Discharge status: Home / Self Care | Payer: Medicare Other | Attending: Emergency Medicine | Admitting: Emergency Medicine

## 2015-12-08 ENCOUNTER — Emergency Department (HOSPITAL_COMMUNITY): Payer: Medicare Other

## 2015-12-08 DIAGNOSIS — M8580 Other specified disorders of bone density and structure, unspecified site: Secondary | ICD-10-CM | POA: Insufficient documentation

## 2015-12-08 DIAGNOSIS — E039 Hypothyroidism, unspecified: Secondary | ICD-10-CM | POA: Insufficient documentation

## 2015-12-08 DIAGNOSIS — Z8781 Personal history of (healed) traumatic fracture: Secondary | ICD-10-CM | POA: Insufficient documentation

## 2015-12-08 DIAGNOSIS — F419 Anxiety disorder, unspecified: Secondary | ICD-10-CM | POA: Diagnosis not present

## 2015-12-08 DIAGNOSIS — I499 Cardiac arrhythmia, unspecified: Secondary | ICD-10-CM | POA: Diagnosis not present

## 2015-12-08 DIAGNOSIS — E079 Disorder of thyroid, unspecified: Secondary | ICD-10-CM | POA: Insufficient documentation

## 2015-12-08 DIAGNOSIS — Z8742 Personal history of other diseases of the female genital tract: Secondary | ICD-10-CM | POA: Insufficient documentation

## 2015-12-08 DIAGNOSIS — K219 Gastro-esophageal reflux disease without esophagitis: Secondary | ICD-10-CM | POA: Insufficient documentation

## 2015-12-08 DIAGNOSIS — I10 Essential (primary) hypertension: Secondary | ICD-10-CM | POA: Diagnosis not present

## 2015-12-08 DIAGNOSIS — F209 Schizophrenia, unspecified: Secondary | ICD-10-CM | POA: Insufficient documentation

## 2015-12-08 DIAGNOSIS — Z79899 Other long term (current) drug therapy: Secondary | ICD-10-CM | POA: Insufficient documentation

## 2015-12-08 DIAGNOSIS — Z791 Long term (current) use of non-steroidal anti-inflammatories (NSAID): Secondary | ICD-10-CM | POA: Diagnosis not present

## 2015-12-08 DIAGNOSIS — R05 Cough: Secondary | ICD-10-CM | POA: Diagnosis not present

## 2015-12-08 DIAGNOSIS — J029 Acute pharyngitis, unspecified: Secondary | ICD-10-CM

## 2015-12-08 DIAGNOSIS — J069 Acute upper respiratory infection, unspecified: Secondary | ICD-10-CM | POA: Diagnosis not present

## 2015-12-08 LAB — CBC WITH DIFFERENTIAL/PLATELET
Basophils Absolute: 0 10*3/uL (ref 0.0–0.1)
Basophils Relative: 1 %
EOS ABS: 0 10*3/uL (ref 0.0–0.7)
EOS PCT: 0 %
HCT: 38.9 % (ref 36.0–46.0)
Hemoglobin: 13.7 g/dL (ref 12.0–15.0)
LYMPHS ABS: 0.4 10*3/uL — AB (ref 0.7–4.0)
LYMPHS PCT: 9 %
MCH: 32 pg (ref 26.0–34.0)
MCHC: 35.2 g/dL (ref 30.0–36.0)
MCV: 90.9 fL (ref 78.0–100.0)
MONOS PCT: 11 %
Monocytes Absolute: 0.5 10*3/uL (ref 0.1–1.0)
Neutro Abs: 3.5 10*3/uL (ref 1.7–7.7)
Neutrophils Relative %: 79 %
Platelets: 126 10*3/uL — ABNORMAL LOW (ref 150–400)
RBC: 4.28 MIL/uL (ref 3.87–5.11)
RDW: 12.7 % (ref 11.5–15.5)
WBC: 4.4 10*3/uL (ref 4.0–10.5)

## 2015-12-08 LAB — COMPREHENSIVE METABOLIC PANEL
ALT: 13 U/L — AB (ref 14–54)
ANION GAP: 7 (ref 5–15)
AST: 25 U/L (ref 15–41)
Albumin: 3.9 g/dL (ref 3.5–5.0)
Alkaline Phosphatase: 43 U/L (ref 38–126)
BUN: 9 mg/dL (ref 6–20)
CHLORIDE: 101 mmol/L (ref 101–111)
CO2: 25 mmol/L (ref 22–32)
CREATININE: 0.71 mg/dL (ref 0.44–1.00)
Calcium: 8.4 mg/dL — ABNORMAL LOW (ref 8.9–10.3)
Glucose, Bld: 105 mg/dL — ABNORMAL HIGH (ref 65–99)
Potassium: 3.6 mmol/L (ref 3.5–5.1)
SODIUM: 133 mmol/L — AB (ref 135–145)
Total Bilirubin: 0.7 mg/dL (ref 0.3–1.2)
Total Protein: 6.2 g/dL — ABNORMAL LOW (ref 6.5–8.1)

## 2015-12-08 MED ORDER — AMOXICILLIN 500 MG PO CAPS: 500.0000 mg | ORAL_CAPSULE | Freq: Three times a day (TID) | ORAL | Status: DC

## 2015-12-08 MED ORDER — HYDROCOD POLST-CPM POLST ER 10-8 MG/5ML PO SUER: 2.5000 mL | Freq: Two times a day (BID) | ORAL | Status: DC | PRN

## 2015-12-08 NOTE — ED Notes (Signed)
Productive cough,  Weakness and generalized pain for several days.

## 2015-12-08 NOTE — ED Provider Notes (Signed)
CSN: [id number]     Arrival date & time 12/08/15  [id number] History   First MD Initiated Contact with Patient 12/08/15 1005     Chief Complaint  Patient presents with  . Weakness  . Cough     (Consider location/radiation/quality/duration/timing/severity/associated sxs/prior Treatment) Patient is a 81 y.o. female presenting with cough. The history is provided by the patient and a relative. No language interpreter was used.  Cough Cough characteristics:  Productive Sputum characteristics:  Clear Severity:  Moderate Onset quality:  Gradual Duration:  3 days Timing:  Constant Progression:  Worsening Chronicity:  New Context: upper respiratory infection   Relieved by:  Nothing Worsened by:  Nothing tried Associated symptoms: sore throat   Associated symptoms: no chest pain and no fever   Pt complains of pain in her throat.  Pt has a harsh cough and raspy voice.  Past Medical History  Diagnosis Date  . Thyroid disease   . HTN (hypertension)   . Anxiety   . GERD (gastroesophageal reflux disease)   . Hypothyroidism   . Dysphagia 09/21/2011  . Chest pain 09/21/2011    OCCASIONAL  . Polydipsia 09/21/2011  . Celiac disease   . Knee fracture, left 03/2012  . Dysrhythmia     "heart arrthymia" per son, but unsure what kind  . Osteopenia   . Difficulty sleeping     TAKES MED TO SLEEP  . Schizophrenia ([place])     FAMILY UNSURE OF THIS  . 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 Ovarian mass   . Constipation    Past Surgical History  Procedure Laterality Date  . Left shoulder    . Thyroidectomy    . Vertebroplasty    . Left hip replacement    . Esophagogastroduodenoscopy  09/27/2011    Inflammatory changes midesophagus most likely pill-induced injury (Fosamax), large diaphragmatic hernia, biopsies from small bowel consistent with celiac disease  . Esophagogastroduodenoscopy (egd) with esophageal dilation N/A 07/13/2013    [id number] esophagitis-more likely pill-induced s/p esophageal  biopsy. Noncritical. Schatzki's ring s/p passage of a Maloney dilator. Hiatal hernia. Abnormal duodenum consistent with prior diagnosis of celiac disease. Status post 47 French Maloney dilator. Esophageal biopsies benign showed marked acute and chronic inflammation.  . Laparoscopic salpingo oopherectomy Bilateral 11/16/2013    Procedure: LAPAROSCOPIC BILATERAL SALPINGO OOPHORECTOMY;  Surgeon: Imagene Gurney A. Alycia Rossetti, MD;  Location: [facility];  Service: Gynecology;  Laterality: Bilateral;   Family History  Problem Relation Age of Onset  . Cancer Sister     brain tumor  . Hypertension Brother   . Hypertension Daughter   . Kidney disease Sister    Social History  Substance Use Topics  . Smoking status: Never Smoker   . Smokeless tobacco: Never Used  . Alcohol Use: No     Comment: rarely   OB History    No data available     Review of Systems  Constitutional: Negative for fever.  HENT: Positive for sore throat.   Respiratory: Positive for cough.   Cardiovascular: Negative for chest pain.  All other systems reviewed and are negative.     Allergies  Gluten  Home Medications   Prior to Admission medications   Medication Sig Start Date End Date Taking? Authorizing Provider  acetaminophen (TYLENOL) 500 MG tablet Take 325 mg by mouth every 6 (six) hours as needed for pain.     Historical Provider, MD  calcium carbonate (OS-CAL - DOSED IN MG OF ELEMENTAL CALCIUM) 1250 MG tablet Take 1 tablet by mouth  every morning.    Historical Provider, MD  denosumab (PROLIA) 60 MG/ML SOLN injection Inject 60 mg into the skin every 6 (six) months. Administer in upper arm, thigh, or abdomen    Historical Provider, MD  diclofenac (VOLTAREN) 50 MG EC tablet Take 50 mg by mouth daily.     Historical Provider, MD  Iron-FA-B Cmp-C-Biot-Probiotic (FUSION PLUS PO) Take by mouth daily.    Historical Provider, MD  levothyroxine (SYNTHROID, LEVOTHROID) 75 MCG tablet Take 75 mcg by mouth every morning.    Historical  Provider, MD  lisinopril (PRINIVIL,ZESTRIL) 40 MG tablet Take 40 mg by mouth every morning.     Historical Provider, MD  omeprazole (PRILOSEC) 20 MG capsule TAKE ONE CAPSULE BY MOUTH TWICE DAILY. 12/06/15   Orvil Feil, NP  polyethylene glycol [facility] / Floria Raveling) packet Take 17 g by mouth daily. 08/25/13   Sherwood Gambler, MD  QUEtiapine (SEROQUEL) 100 MG tablet Take 100 mg by mouth at bedtime.      Historical Provider, MD   BP 139/83 mmHg  Pulse 102  Temp(Src) 97.7 F (36.5 C) (Oral)  Ht 5\' 3"  (1.6 m)  Wt 43.999 kg  BMI 17.19 kg/m2  SpO2 99% Physical Exam  Constitutional: She is oriented to person, place, and time. She appears well-developed and well-nourished.  HENT:  Head: Normocephalic.  Right Ear: External ear normal.  Left Ear: External ear normal.  Nose: Nose normal.  Mouth/Throat: Oropharynx is clear and moist.  Raspy voice  Eyes: Conjunctivae and EOM are normal. Pupils are equal, round, and reactive to light.  Neck: Normal range of motion.  Cardiovascular: Normal rate and normal heart sounds.   Pulmonary/Chest: Effort normal and breath sounds normal.  Abdominal: Soft. She exhibits no distension.  Musculoskeletal: Normal range of motion.  Neurological: She is alert and oriented to person, place, and time.  Skin: Skin is warm.  Psychiatric: She has a normal mood and affect.  Nursing note and vitals reviewed.   ED Course  Procedures (including critical care time) Labs Review Labs Reviewed  CBC WITH DIFFERENTIAL/PLATELET - Abnormal; Notable for the following:    Platelets 126 (*)    Lymphs Abs 0.4 (*)    All other components within normal limits  COMPREHENSIVE METABOLIC PANEL - Abnormal; Notable for the following:    Sodium 133 (*)    Glucose, Bld 105 (*)    Calcium 8.4 (*)    Total Protein 6.2 (*)    ALT 13 (*)    All other components within normal limits    Imaging Review Dg Chest 2 View  12/08/2015  CLINICAL DATA:  Productive cough EXAM: CHEST - 2 VIEW  COMPARISON:  11/01/2013 FINDINGS: Cardiac shadow is stable. The lungs are well aerated bilaterally. S-shaped scoliosis of the thoracolumbar spine is again seen. Vertebral augmentation is again noted. IMPRESSION: No active disease. Electronically Signed   By: Inez Catalina M.D.   On: 12/08/2015 10:46   I have personally reviewed and evaluated these images and lab results as part of my medical decision-making.   EKG Interpretation None      MDM   I will treat with amoxicillian for throat and possible sinus involvement.     Final diagnoses:  Pharyngitis  URI (upper respiratory infection)        Fransico Meadow, PA-C 12/08/15 [place], MD 12/10/15 [phone]

## 2015-12-08 NOTE — Discharge Instructions (Signed)

## 2015-12-22 DIAGNOSIS — E782 Mixed hyperlipidemia: Secondary | ICD-10-CM | POA: Diagnosis not present

## 2015-12-22 DIAGNOSIS — D509 Iron deficiency anemia, unspecified: Secondary | ICD-10-CM | POA: Diagnosis not present

## 2015-12-22 DIAGNOSIS — E039 Hypothyroidism, unspecified: Secondary | ICD-10-CM | POA: Diagnosis not present

## 2015-12-22 DIAGNOSIS — I10 Essential (primary) hypertension: Secondary | ICD-10-CM | POA: Diagnosis not present

## 2015-12-26 DIAGNOSIS — F209 Schizophrenia, unspecified: Secondary | ICD-10-CM | POA: Diagnosis not present

## 2015-12-26 DIAGNOSIS — D509 Iron deficiency anemia, unspecified: Secondary | ICD-10-CM | POA: Diagnosis not present

## 2015-12-26 DIAGNOSIS — E039 Hypothyroidism, unspecified: Secondary | ICD-10-CM | POA: Diagnosis not present

## 2015-12-26 DIAGNOSIS — I10 Essential (primary) hypertension: Secondary | ICD-10-CM | POA: Diagnosis not present

## 2016-02-12 ENCOUNTER — Encounter (HOSPITAL_COMMUNITY): Payer: Self-pay

## 2016-02-12 ENCOUNTER — Encounter (HOSPITAL_COMMUNITY)
Admission: RE | Admit: 2016-02-12 | Discharge: 2016-02-12 | Disposition: A | Discharge status: Home / Self Care | Payer: Medicare Other | Source: Ambulatory Visit | Attending: Internal Medicine | Admitting: Internal Medicine

## 2016-02-12 DIAGNOSIS — M818 Other osteoporosis without current pathological fracture: Secondary | ICD-10-CM | POA: Diagnosis not present

## 2016-02-12 MED ORDER — DENOSUMAB 60 MG/ML ~~LOC~~ SOLN
60.0000 mg | Freq: Once | SUBCUTANEOUS | Status: AC
  Administered 2016-02-12: 60 mg via SUBCUTANEOUS
  Filled 2016-02-12: qty 1

## 2016-02-12 MED ORDER — SODIUM CHLORIDE 0.9 % IV SOLN: INTRAVENOUS | Status: DC

## 2016-02-15 ENCOUNTER — Encounter: Payer: Self-pay | Admitting: Gastroenterology

## 2016-02-15 ENCOUNTER — Ambulatory Visit (INDEPENDENT_AMBULATORY_CARE_PROVIDER_SITE_OTHER): Payer: Medicare Other | Admitting: Gastroenterology

## 2016-02-15 VITALS — BP 122/79 | HR 82 | Temp 97.6°F | Ht 60.0 in | Wt 99.2 lb

## 2016-02-15 DIAGNOSIS — K900 Celiac disease: Secondary | ICD-10-CM | POA: Diagnosis not present

## 2016-02-15 DIAGNOSIS — K219 Gastro-esophageal reflux disease without esophagitis: Secondary | ICD-10-CM | POA: Diagnosis not present

## 2016-02-15 NOTE — Progress Notes (Signed)
Primary Care Physician: Wende Neighbors, MD  Primary Gastroenterologist:  Garfield Cornea, MD   Chief Complaint  Patient presents with  . Gastroesophageal Reflux    HPI: Felicia Harvey is a 82 y.o. female here for follow-up of GERD and celiac disease. Last seen in September 2016.She also has a history of small Zenker's diverticulum seen on upper GI series in July 2014. History of pill induced esophageal injury due to Fosamax as well as dilation of a noncritical Schatzki ring back in 2014. Barium pill esophagram December 2015 showed nonspecific esophageal motility disorder with occasional tertiary contraction, GERD but no stricture. No prior colonoscopy to patient's knowledge.  Clinically she is doing well. Her appetite is good. Bowel movements are regular with MiraLAX. Only occasional swallowing concerns with sticky foods. Denies abdominal pain. No melena rectal bleeding.  Current Outpatient Prescriptions  Medication Sig Dispense Refill  . acetaminophen (TYLENOL) 500 MG tablet Take 325 mg by mouth every 6 (six) hours as needed for pain.     . calcium carbonate (OS-CAL - DOSED IN MG OF ELEMENTAL CALCIUM) 1250 MG tablet Take 1 tablet by mouth every morning.    . denosumab (PROLIA) 60 MG/ML SOLN injection Inject 60 mg into the skin every 6 (six) months. Administer in upper arm, thigh, or abdomen    . diclofenac (VOLTAREN) 50 MG EC tablet Take 50 mg by mouth daily.     . Iron-FA-B Cmp-C-Biot-Probiotic (FUSION PLUS PO) Take 1 capsule by mouth daily.     Marland Kitchen levothyroxine (SYNTHROID, LEVOTHROID) 75 MCG tablet Take 75 mcg by mouth every morning.    Marland Kitchen lisinopril (PRINIVIL,ZESTRIL) 40 MG tablet Take 40 mg by mouth every morning.     Marland Kitchen omeprazole (PRILOSEC) 20 MG capsule TAKE ONE CAPSULE BY MOUTH TWICE DAILY. 60 capsule 5  . polyethylene glycol (MIRALAX / GLYCOLAX) packet Take 17 g by mouth daily. 14 each 0  . QUEtiapine (SEROQUEL) 200 MG tablet Take 200 mg by mouth at bedtime.     No current  facility-administered medications for this visit.    Allergies as of 02/15/2016 - Review Complete 02/15/2016  Allergen Reaction Noted  . Gluten  09/20/2011    ROS:  General: Negative for anorexia, weight loss, fever, chills, fatigue, weakness. ENT: Negative for hoarseness, difficulty swallowing , nasal congestion. CV: Negative for chest pain, angina, palpitations, dyspnea on exertion, peripheral edema.  Respiratory: Negative for dyspnea at rest, dyspnea on exertion, cough, sputum, wheezing.  GI: See history of present illness. GU:  Negative for dysuria, hematuria, urinary incontinence, urinary frequency, nocturnal urination.  Endo: Negative for unusual weight change.    Physical Examination:   BP 122/79 mmHg  Pulse 82  Temp(Src) 97.6 F (36.4 C) (Oral)  Ht 5' (1.524 m)  Wt 99 lb 3.2 oz (44.997 kg)  BMI 19.37 kg/m2  General: Well-nourished, well-developed in no acute distress.  Eyes: No icterus. Mouth: Oropharyngeal mucosa moist and pink , no lesions erythema or exudate. Lungs: Clear to auscultation bilaterally.  Heart: Regular rate and rhythm, no murmurs rubs or gallops.  Abdomen: Bowel sounds are normal, nontender, nondistended, no hepatosplenomegaly or masses, no abdominal bruits or hernia , no rebound or guarding.   Extremities: No lower extremity edema. No clubbing or deformities. Neuro: Alert and oriented x 4   Skin: Warm and dry, no jaundice.   Psych: Alert and cooperative, normal mood and affect.  Labs:  Lab Results  Component Value Date   WBC 4.4 12/08/2015  HGB 13.7 12/08/2015   HCT 38.9 12/08/2015   MCV 90.9 12/08/2015   PLT 126* 12/08/2015   Lab Results  Component Value Date   CREATININE 0.71 12/08/2015   BUN 9 12/08/2015   NA 133* 12/08/2015   K 3.6 12/08/2015   CL 101 12/08/2015   CO2 25 12/08/2015   Lab Results  Component Value Date   ALT 13* 12/08/2015   AST 25 12/08/2015   ALKPHOS 43 12/08/2015   BILITOT 0.7 12/08/2015    Imaging  Studies: No results found.

## 2016-02-15 NOTE — Assessment & Plan Note (Signed)
Doing well from a acid reflux and dysphagia standpoint. Continue omeprazole 1-2 times daily as needed. Return to the office in 6 months or sooner if needed.

## 2016-02-15 NOTE — Assessment & Plan Note (Signed)
Continue gluten-free diet and supplements. Return to the office in 6 months or sooner if needed.

## 2016-02-15 NOTE — Patient Instructions (Signed)
1. Return to the office in six months or call sooner if needed.  

## 2016-02-19 NOTE — Progress Notes (Signed)
cc'ed to pcp °

## 2016-04-30 DIAGNOSIS — H52223 Regular astigmatism, bilateral: Secondary | ICD-10-CM | POA: Diagnosis not present

## 2016-04-30 DIAGNOSIS — H43813 Vitreous degeneration, bilateral: Secondary | ICD-10-CM | POA: Diagnosis not present

## 2016-04-30 DIAGNOSIS — H524 Presbyopia: Secondary | ICD-10-CM | POA: Diagnosis not present

## 2016-04-30 DIAGNOSIS — H5203 Hypermetropia, bilateral: Secondary | ICD-10-CM | POA: Diagnosis not present

## 2016-05-24 ENCOUNTER — Telehealth: Payer: Self-pay

## 2016-05-24 NOTE — Telephone Encounter (Signed)
pts son- Gwyndolyn Saxon had left me a message about his mom. He said she was having difficulty swallowing and was getting chocked on certain foods. I called him back and he said that he has spoken with someone from the office and pt has ov on Monday at 11:30 with EG. I advised him that if she has problems over the weekend she should go to the ED. Son verbalized understanding.

## 2016-05-27 ENCOUNTER — Encounter: Payer: Self-pay | Admitting: Nurse Practitioner

## 2016-05-27 ENCOUNTER — Ambulatory Visit (INDEPENDENT_AMBULATORY_CARE_PROVIDER_SITE_OTHER): Payer: Medicare Other | Admitting: Nurse Practitioner

## 2016-05-27 VITALS — BP 167/80 | HR 49 | Temp 96.8°F | Ht 61.0 in | Wt 100.8 lb

## 2016-05-27 DIAGNOSIS — R1310 Dysphagia, unspecified: Secondary | ICD-10-CM

## 2016-05-27 DIAGNOSIS — K5900 Constipation, unspecified: Secondary | ICD-10-CM | POA: Diagnosis not present

## 2016-05-27 DIAGNOSIS — K900 Celiac disease: Secondary | ICD-10-CM

## 2016-05-27 NOTE — Progress Notes (Addendum)
Referring Provider: Celene Squibb, MD Primary Care Physician:  Wende Neighbors, MD Primary GI:  Dr. Gala Romney  Chief Complaint  Patient presents with  . Follow-up    HPI:   Felicia Harvey is a 82 y.o. female who presents for worsening dysphagia symptoms. She was last seen in our office 02/15/16 for GERD and celiac disease. Noted history of small Zenker's diverticulum on upper GI series in July 2014, history of pill-induced esophageal injury, history of dilation of noncritical Schatzki's ring in 2014. Barium pill esophagram December 2015 showed nonspecific esophageal motility disorder with occasional tertiary contraction, GERD, but no stricture. No prior colonoscopy per patient's knowledge. At her last visit she is doing well. Recommended continue gluten-free diet and supplements, continue omeprazole 1-2 times a day as needed, return for follow-up in 6 months.  Today her son accompanies her. States she's beeing doing much better with swallowing now that son is preparing her food. He provides her gluten free foods. Cereal and milk (softened), salads with very small cuts of vegetables, boiled/softened foods. Keeps fluids on had while eating. Occasional bloating. Has more constipation and is on Miralax daily and Milk of Magnesia as needed. Bloating tends to resolve after bowel movement. She is satisfied with how these are working. Denies hematochezia, melena, fever, chills, unintentional weight loss. Denies chest pain, dyspnea, dizziness, lightheadedness, syncope, near syncope. Denies any other upper or lower GI symptoms.  Past Medical History  Diagnosis Date  . Thyroid disease   . HTN (hypertension)   . Anxiety   . GERD (gastroesophageal reflux disease)   . Hypothyroidism   . Dysphagia 09/21/2011  . Chest pain 09/21/2011    OCCASIONAL  . Polydipsia 09/21/2011  . Celiac disease   . Knee fracture, left 03/2012  . Dysrhythmia     "heart arrthymia" per son, but unsure what kind  . Osteopenia   .  Difficulty sleeping     TAKES MED TO SLEEP  . Schizophrenia ([place])     FAMILY UNSURE OF THIS  . 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 Ovarian mass   . Constipation     Past Surgical History  Procedure Laterality Date  . Left shoulder    . Thyroidectomy    . Vertebroplasty    . Left hip replacement    . Esophagogastroduodenoscopy  09/27/2011    Inflammatory changes midesophagus most likely pill-induced injury (Fosamax), large diaphragmatic hernia, biopsies from small bowel consistent with celiac disease  . Esophagogastroduodenoscopy (egd) with esophageal dilation N/A 07/13/2013    [id number] esophagitis-more likely pill-induced s/p esophageal biopsy. Noncritical. Schatzki's ring s/p passage of a Maloney dilator. Hiatal hernia. Abnormal duodenum consistent with prior diagnosis of celiac disease. Status post 62 French Maloney dilator. Esophageal biopsies benign showed marked acute and chronic inflammation.  . Laparoscopic salpingo oopherectomy Bilateral 11/16/2013    Procedure: LAPAROSCOPIC BILATERAL SALPINGO OOPHORECTOMY;  Surgeon: Imagene Gurney A. Alycia Rossetti, MD;  Location: [facility];  Service: Gynecology;  Laterality: Bilateral;    Current Outpatient Prescriptions  Medication Sig Dispense Refill  . acetaminophen (TYLENOL) 500 MG tablet Take 325 mg by mouth every 6 (six) hours as needed for pain.     . calcium carbonate (OS-CAL - DOSED IN MG OF ELEMENTAL CALCIUM) 1250 MG tablet Take 1 tablet by mouth every morning.    . denosumab (PROLIA) 60 MG/ML SOLN injection Inject 60 mg into the skin every 6 (six) months. Administer in upper arm, thigh, or abdomen    . diclofenac (VOLTAREN) 50 MG EC tablet  Take 50 mg by mouth daily.     . Iron-FA-B Cmp-C-Biot-Probiotic (FUSION PLUS PO) Take 1 capsule by mouth daily.     Marland Kitchen levothyroxine (SYNTHROID, LEVOTHROID) 75 MCG tablet Take 75 mcg by mouth every morning.    Marland Kitchen lisinopril (PRINIVIL,ZESTRIL) 40 MG tablet Take 40 mg by mouth every morning.     Marland Kitchen omeprazole (PRILOSEC)  20 MG capsule TAKE ONE CAPSULE BY MOUTH TWICE DAILY. 60 capsule 5  . polyethylene glycol (MIRALAX / GLYCOLAX) packet Take 17 g by mouth daily. 14 each 0  . QUEtiapine (SEROQUEL) 200 MG tablet Take 200 mg by mouth at bedtime.     No current facility-administered medications for this visit.    Allergies as of 05/27/2016 - Review Complete 05/27/2016  Allergen Reaction Noted  . Gluten  09/20/2011    Family History  Problem Relation Age of Onset  . Cancer Sister     brain tumor  . Hypertension Brother   . Hypertension Daughter   . Kidney disease Sister     Social History   Social History  . Marital Status: Divorced    Spouse Name: N/A  . Number of Children: N/A  . Years of Education: 12th   Occupational History  . Retired    Social History Main Topics  . Smoking status: Never Smoker   . Smokeless tobacco: Never Used  . Alcohol Use: No     Comment: rarely  . Drug Use: No  . Sexual Activity: Not Currently    Birth Control/ Protection: Post-menopausal   Other Topics Concern  . None   Social History Narrative    Review of Systems: General: Negative for anorexia, weight loss, fever, chills, fatigue, weakness. ENT: Negative for hoarseness, difficulty swallowing. CV: Negative for chest pain, angina, palpitations, peripheral edema.  Respiratory: Negative for dyspnea at rest, cough, sputum, wheezing.  GI: See history of present illness. Endo: Negative for unusual weight change.    Physical Exam: BP 167/80 mmHg  Pulse 49  Temp(Src) 96.8 F (36 C)  Ht 5\' 1"  (1.549 m)  Wt 100 lb 12.8 oz (45.723 kg)  BMI 19.06 kg/m2 General:   Alert and oriented. Pleasant and cooperative. Well-nourished and well-developed.  Ears:  Normal auditory acuity. Cardiovascular:  S1, S2 present without murmurs appreciated. Extremities without clubbing or edema. Respiratory:  Clear to auscultation bilaterally. No wheezes, rales, or rhonchi. No distress.  Gastrointestinal:  +BS, soft,  non-tender and non-distended. No HSM noted. No guarding or rebound. No masses appreciated.  Rectal:  Deferred  Musculoskalatal:  Symmetrical without gross deformities. Neurologic:  Alert and oriented x4;  grossly normal neurologically. Psych:  Alert and cooperative. Normal mood and affect. Heme/Lymph/Immune: No excessive bruising noted.    05/27/2016 12:24 PM   Disclaimer: This note was dictated with voice recognition software. Similar sounding words can inadvertently be transcribed and may not be corrected upon review.

## 2016-05-27 NOTE — Assessment & Plan Note (Signed)
The patient notes that she does have some issues with constipation. Is currently taking MiraLAX 17 g once a day and milk of magnesia as needed. This tends to work well for her. Started her to call us if constipation becomes worse and we can evaluate for retained stool and possible need for change in regimen. Return for follow-up as previously scheduled.

## 2016-05-27 NOTE — Progress Notes (Signed)
cc'ed to pcp °

## 2016-05-27 NOTE — Assessment & Plan Note (Signed)
Doing well on gluten-free diet. Recommend she continue this. Return for follow-up as previously scheduled.

## 2016-05-27 NOTE — Patient Instructions (Addendum)
1. Continue your gluten-free diet. 2. Continue taking MiraLAX every day and milk of magnesia as needed. 3. If her constipation worsens, call us. 4. Continue with her food preparation and swallowing precautions as you have been and as we discussed. Further information is provided below. 5. Return for follow-up on September 1 which are ready scheduled appointment with Magda Paganini.   Dysphagia Diet Level 3, Mechanically Advanced The dysphagia level 3 diet includes foods that are soft, moist, and can be chopped into 1-inch chunks. This diet is helpful for people with mild swallowing difficulties. It reduces the risk of food getting caught in the windpipe, trachea, or lungs. WHAT DO I NEED TO KNOW ABOUT THIS DIET?  You may eat foods that are soft and moist.  If you were on the dysphagia level 1 or level 2 diets, you may eat any of the foods included on those lists.  Avoid foods that are dry, hard, sticky, chewy, coarse, and crunchy. Also avoid large cuts of food.  Take small bites. Each bite should contain 1 inch or less of food.  Thicken liquids if instructed by your health care provider. Follow your health care provider's instructions on how to do this and to what consistency.  See your dietitian or speech language pathologist regularly for help with your dietary changes. WHAT FOODS CAN I EAT? Grains Moist breads without nuts or seeds. Biscuits, muffins, pancakes, and waffles well-moistened with syrup, jelly, margarine, or butter. Smooth cereals with plenty of milk to moisten them. Moist bread stuffing. Moist rice. Vegetables All cooked, soft vegetables. Shredded lettuce. Tender fried potatoes. Fruits All canned and cooked fruits. Soft, peeled fresh fruits, such as peaches, nectarines, kiwis, cantaloupe, honeydew melon, and watermelon without seeds. Soft berries, such as strawberries. Meat and Other Protein Sources Moist ground or finely diced or sliced meats. Solid, tender cuts of meat.  Meatloaf. Hamburger with a bun. Sausage patty. Deli thin-sliced lunch meat. Chicken, egg, or tuna salad sandwich. Sloppy joe. Moist fish. Eggs prepared any way. Casseroles with small chunks of meats, ground meats, or tender meats. Dairy Cheese spreads without coarse large chunks. Shredded cheese. Cheese slices. Cottage cheese. Milk at the right texture. Smooth frappes. Yogurt without nuts or coconut. Ask your health care provider whether you can have frozen desserts (such as malts or milk shakes) and thin liquids. Sweets/Desserts Soft, smooth, moist desserts. Non-chewy, smooth candy. Jam. Jelly. Honey. Preserves. Ask your health care provider whether you can have frozen desserts. Fats and Oils Butter. Oils. Margarine. Mayonnaise. Gravy. Spreads. Other All seasonings and sweeteners. All sauces without large chunks. The items listed above may not be a complete list of recommended foods or beverages. Contact your dietitian for more options. WHAT FOODS ARE NOT RECOMMENDED? Grains Coarse or dry cereals. Dry breads. Toast. Crackers. Tough, crusty breads, such French bread and baguettes. Tough, crisp fried potatoes. Potato skins. Dry bread stuffing. Granola. Popcorn. Chips. Vegetables All raw vegetables except shredded lettuce. Cooked corn. Rubbery or stiff cooked vegetables. Stringy vegetables, such as celery. Fruits Hard fruits that are difficult to chew, such as apples or pears. Stringy, high-pulp fruits, such as pineapple, papaya, or mango. Fruits with tough skins, such as grapes. Coconut. All dried fruits. Fruit leather. Fruit roll-ups. Fruit snacks. Meat and Other Protein Sources Dry or tough meats or poultry. Dry fish. Fish with bones. Peanut butter. All nuts and seeds. Dairy  Any with nuts, seeds, chocolate chips, dried fruit, coconut, or pineapple. Sweets/Desserts Dry cakes. Chewy or dry cookies. Any with nuts,  seeds, dry fruits, coconut, pineapple, or anything dry, sticky, or hard. Chewy  caramel. Licorice. Taffy-type candies. Ask your health care provider whether you can have frozen desserts. Fats and Oils Any with chunks, nuts, seeds, or pineapple. Olives. Angie Fava. Other Soups with tough or large chunks of meats, poultry, or vegetables. Corn or clam chowder. The items listed above may not be a complete list of foods and beverages to avoid. Contact your dietitian for more information.   This information is not intended to replace advice given to you by your health care provider. Make sure you discuss any questions you have with your health care provider.   Document Released: 11/25/2005 Document Revised: 12/16/2014 Document Reviewed: 11/08/2013 Elsevier Interactive Patient Education [employer].

## 2016-05-27 NOTE — Assessment & Plan Note (Signed)
She scheduled this interim follow-up appointment for worsening dysphagia. However, her son states that he started preparing her foods and has cooked lots of tender boiled meats, smaller pieces, keeping fluids on hand, small pieces of vegetables. She notes that her dysphagia has improved significantly/essentially resolved with this change in dietary intake. Recommended continue dysphagia 3 diet recommendations, provided additional information. Return for follow-up as previously scheduled.

## 2016-05-28 NOTE — Telephone Encounter (Signed)
Noted, patient seen in office. See OV note for details.

## 2016-06-03 ENCOUNTER — Other Ambulatory Visit: Payer: Self-pay | Admitting: Gastroenterology

## 2016-06-24 DIAGNOSIS — E782 Mixed hyperlipidemia: Secondary | ICD-10-CM | POA: Diagnosis not present

## 2016-06-24 DIAGNOSIS — D509 Iron deficiency anemia, unspecified: Secondary | ICD-10-CM | POA: Diagnosis not present

## 2016-06-24 DIAGNOSIS — E039 Hypothyroidism, unspecified: Secondary | ICD-10-CM | POA: Diagnosis not present

## 2016-06-26 DIAGNOSIS — M818 Other osteoporosis without current pathological fracture: Secondary | ICD-10-CM | POA: Diagnosis not present

## 2016-06-26 DIAGNOSIS — F209 Schizophrenia, unspecified: Secondary | ICD-10-CM | POA: Diagnosis not present

## 2016-06-26 DIAGNOSIS — I10 Essential (primary) hypertension: Secondary | ICD-10-CM | POA: Diagnosis not present

## 2016-06-26 DIAGNOSIS — E039 Hypothyroidism, unspecified: Secondary | ICD-10-CM | POA: Diagnosis not present

## 2016-06-26 DIAGNOSIS — D509 Iron deficiency anemia, unspecified: Secondary | ICD-10-CM | POA: Diagnosis not present

## 2016-06-26 DIAGNOSIS — R1310 Dysphagia, unspecified: Secondary | ICD-10-CM | POA: Diagnosis not present

## 2016-08-09 ENCOUNTER — Other Ambulatory Visit: Payer: Self-pay

## 2016-08-09 ENCOUNTER — Encounter: Payer: Self-pay | Admitting: Gastroenterology

## 2016-08-09 ENCOUNTER — Ambulatory Visit (INDEPENDENT_AMBULATORY_CARE_PROVIDER_SITE_OTHER): Payer: Medicare Other | Admitting: Gastroenterology

## 2016-08-09 VITALS — BP 128/78 | HR 74 | Temp 97.4°F | Ht 62.0 in | Wt 96.6 lb

## 2016-08-09 DIAGNOSIS — R1310 Dysphagia, unspecified: Secondary | ICD-10-CM

## 2016-08-09 DIAGNOSIS — K219 Gastro-esophageal reflux disease without esophagitis: Secondary | ICD-10-CM | POA: Diagnosis not present

## 2016-08-09 DIAGNOSIS — K900 Celiac disease: Secondary | ICD-10-CM

## 2016-08-09 NOTE — Progress Notes (Signed)
cc'ed to pcp °

## 2016-08-09 NOTE — Progress Notes (Signed)
Primary Care Physician: Wende Neighbors, MD  Primary Gastroenterologist:  Garfield Cornea, MD   Chief Complaint  Patient presents with  . Follow-up    doing ok    HPI: Felicia Harvey is a 82 y.o. female here for follow-up of GERD and celiac disease. Last seen in March of this year. She also has a history of small Zenker's diverticulum seen on upper GI series in July 2014. History of pill-induced esophageal injury due to Fosamax as well as dilation of noncritical Schatzki ring back in 2014. Barium pill esophagram December 2015 showed nonspecific esophageal motility disorder with occasional tertiary contraction, GERD but no stricture. No prior colonoscopy to patient's knowledge.  Patient doing well on dysphagia 3 diet. Maintaining gluten-free diet. Denies abdominal pain. Bowel movements are fairly regular. Occasional constipation which she takes MiraLAX or milk of magnesia. No blood in the stool or melena. Weight is fairly stable. No vomiting or diminished appetite.  Current Outpatient Prescriptions  Medication Sig Dispense Refill  . acetaminophen (TYLENOL) 500 MG tablet Take 325 mg by mouth every 6 (six) hours as needed for pain.     . calcium carbonate (OS-CAL - DOSED IN MG OF ELEMENTAL CALCIUM) 1250 MG tablet Take 1 tablet by mouth every morning.    . denosumab (PROLIA) 60 MG/ML SOLN injection Inject 60 mg into the skin every 6 (six) months. Administer in upper arm, thigh, or abdomen    . diclofenac (VOLTAREN) 50 MG EC tablet Take 50 mg by mouth daily.     . Iron-FA-B Cmp-C-Biot-Probiotic (FUSION PLUS PO) Take 1 capsule by mouth daily.     Marland Kitchen levothyroxine (SYNTHROID, LEVOTHROID) 75 MCG tablet Take 75 mcg by mouth every morning.    Marland Kitchen lisinopril (PRINIVIL,ZESTRIL) 40 MG tablet Take 40 mg by mouth every morning.     Marland Kitchen omeprazole (PRILOSEC) 20 MG capsule TAKE ONE CAPSULE BY MOUTH TWICE DAILY. 60 capsule 5  . polyethylene glycol (MIRALAX / GLYCOLAX) packet Take 17 g by mouth daily. 14 each 0    . QUEtiapine (SEROQUEL) 200 MG tablet Take 200 mg by mouth at bedtime.     No current facility-administered medications for this visit.     Allergies as of 08/09/2016 - Review Complete 08/09/2016  Allergen Reaction Noted  . Gluten  09/20/2011    ROS:  General: Negative for anorexia, weight loss, fever, chills, fatigue, weakness. ENT: Negative for hoarseness, difficulty swallowing , nasal congestion. CV: Negative for chest pain, angina, palpitations, dyspnea on exertion, peripheral edema.  Respiratory: Negative for dyspnea at rest, dyspnea on exertion, cough, sputum, wheezing.  GI: See history of present illness. GU:  Negative for dysuria, hematuria, urinary incontinence, urinary frequency, nocturnal urination.  Endo: Negative for unusual weight change.    Physical Examination:   BP 128/78   Pulse 74   Temp 97.4 F (36.3 C) (Oral)   Ht 5\' 2"  (1.575 m)   Wt 96 lb 9.6 oz (43.8 kg)   BMI 17.67 kg/m   General: Well-nourished, well-developed in no acute distress. Severe kyphosis/scoliosis. Eyes: No icterus. Mouth: Oropharyngeal mucosa moist and pink , no lesions erythema or exudate. Lungs: Clear to auscultation bilaterally.  Heart: Regular rate and rhythm, no murmurs rubs or gallops.  Abdomen: Bowel sounds are normal, nontender, nondistended, no hepatosplenomegaly or masses, no abdominal bruits or hernia , no rebound or guarding.   Extremities: No lower extremity edema. No clubbing or deformities. Neuro: Alert and oriented x 4   Skin: Warm and  dry, no jaundice.   Psych: Alert and cooperative, normal mood and affect.

## 2016-08-09 NOTE — Assessment & Plan Note (Signed)
Doing well on gluten-free diet. Continue supplements as before. Return to the office in 6 months or sooner if needed.  We will obtain most recent labs from PCP for review.

## 2016-08-09 NOTE — Patient Instructions (Signed)
1. If you have worsening problems swallowing, please call us.  2. Continue gluten free diet.  3. Dysphagia 3 diet.  4. I will review labs from PCP. 5. Return to see Dr. Gala Romney in six months.   Dysphagia Diet Level 3, Mechanically Advanced The dysphagia level 3 diet includes foods that are soft, moist, and can be chopped into 1-inch chunks. This diet is helpful for people with mild swallowing difficulties. It reduces the risk of food getting caught in the windpipe, trachea, or lungs. WHAT DO I NEED TO KNOW ABOUT THIS DIET?  You may eat foods that are soft and moist.  If you were on the dysphagia level 1 or level 2 diets, you may eat any of the foods included on those lists.  Avoid foods that are dry, hard, sticky, chewy, coarse, and crunchy. Also avoid large cuts of food.  Take small bites. Each bite should contain 1 inch or less of food.  Thicken liquids if instructed by your health care provider. Follow your health care provider's instructions on how to do this and to what consistency.  See your dietitian or speech language pathologist regularly for help with your dietary changes. WHAT FOODS CAN I EAT? Grains Moist breads without nuts or seeds. Biscuits, muffins, pancakes, and waffles well-moistened with syrup, jelly, margarine, or butter. Smooth cereals with plenty of milk to moisten them. Moist bread stuffing. Moist rice. Vegetables All cooked, soft vegetables. Shredded lettuce. Tender fried potatoes. Fruits All canned and cooked fruits. Soft, peeled fresh fruits, such as peaches, nectarines, kiwis, cantaloupe, honeydew melon, and watermelon without seeds. Soft berries, such as strawberries. Meat and Other Protein Sources Moist ground or finely diced or sliced meats. Solid, tender cuts of meat. Meatloaf. Hamburger with a bun. Sausage patty. Deli thin-sliced lunch meat. Chicken, egg, or tuna salad sandwich. Sloppy joe. Moist fish. Eggs prepared any way. Casseroles with small chunks of  meats, ground meats, or tender meats. Dairy Cheese spreads without coarse large chunks. Shredded cheese. Cheese slices. Cottage cheese. Milk at the right texture. Smooth frappes. Yogurt without nuts or coconut. Ask your health care provider whether you can have frozen desserts (such as malts or milk shakes) and thin liquids. Sweets/Desserts Soft, smooth, moist desserts. Non-chewy, smooth candy. Jam. Jelly. Honey. Preserves. Ask your health care provider whether you can have frozen desserts. Fats and Oils Butter. Oils. Margarine. Mayonnaise. Gravy. Spreads. Other All seasonings and sweeteners. All sauces without large chunks. The items listed above may not be a complete list of recommended foods or beverages. Contact your dietitian for more options. WHAT FOODS ARE NOT RECOMMENDED? Grains Coarse or dry cereals. Dry breads. Toast. Crackers. Tough, crusty breads, such French bread and baguettes. Tough, crisp fried potatoes. Potato skins. Dry bread stuffing. Granola. Popcorn. Chips. Vegetables All raw vegetables except shredded lettuce. Cooked corn. Rubbery or stiff cooked vegetables. Stringy vegetables, such as celery. Fruits Hard fruits that are difficult to chew, such as apples or pears. Stringy, high-pulp fruits, such as pineapple, papaya, or mango. Fruits with tough skins, such as grapes. Coconut. All dried fruits. Fruit leather. Fruit roll-ups. Fruit snacks. Meat and Other Protein Sources Dry or tough meats or poultry. Dry fish. Fish with bones. Peanut butter. All nuts and seeds. Dairy  Any with nuts, seeds, chocolate chips, dried fruit, coconut, or pineapple. Sweets/Desserts Dry cakes. Chewy or dry cookies. Any with nuts, seeds, dry fruits, coconut, pineapple, or anything dry, sticky, or hard. Chewy caramel. Licorice. Taffy-type candies. Ask your health care provider whether  you can have frozen desserts. Fats and Oils Any with chunks, nuts, seeds, or pineapple. Olives.  Angie Fava. Other Soups with tough or large chunks of meats, poultry, or vegetables. Corn or clam chowder. The items listed above may not be a complete list of foods and beverages to avoid. Contact your dietitian for more information.   This information is not intended to replace advice given to you by your health care provider. Make sure you discuss any questions you have with your health care provider.   Document Released: 11/25/2005 Document Revised: 12/16/2014 Document Reviewed: 11/08/2013 Elsevier Interactive Patient Education [employer].

## 2016-08-09 NOTE — Assessment & Plan Note (Signed)
GERD well-controlled. Doing well with dysphagia 3 diet. Discussed with patient and son, if worsening swallowing issues, consider further workup at that time. Some of her dysphagia may be related to nonspecific esophageal children disorder seen on previous esophagram. Otherwise see her back in 6 months.

## 2016-08-15 ENCOUNTER — Encounter (HOSPITAL_COMMUNITY): Payer: Medicare Other

## 2016-11-26 ENCOUNTER — Other Ambulatory Visit: Payer: Self-pay | Admitting: Nurse Practitioner

## 2016-12-12 ENCOUNTER — Encounter: Payer: Self-pay | Admitting: Internal Medicine

## 2016-12-20 DIAGNOSIS — E039 Hypothyroidism, unspecified: Secondary | ICD-10-CM | POA: Diagnosis not present

## 2016-12-20 DIAGNOSIS — E782 Mixed hyperlipidemia: Secondary | ICD-10-CM | POA: Diagnosis not present

## 2016-12-20 DIAGNOSIS — D509 Iron deficiency anemia, unspecified: Secondary | ICD-10-CM | POA: Diagnosis not present

## 2016-12-27 DIAGNOSIS — I10 Essential (primary) hypertension: Secondary | ICD-10-CM | POA: Diagnosis not present

## 2016-12-27 DIAGNOSIS — M818 Other osteoporosis without current pathological fracture: Secondary | ICD-10-CM | POA: Diagnosis not present

## 2016-12-27 DIAGNOSIS — E039 Hypothyroidism, unspecified: Secondary | ICD-10-CM | POA: Diagnosis not present

## 2016-12-27 DIAGNOSIS — F209 Schizophrenia, unspecified: Secondary | ICD-10-CM | POA: Diagnosis not present

## 2016-12-27 DIAGNOSIS — D509 Iron deficiency anemia, unspecified: Secondary | ICD-10-CM | POA: Diagnosis not present

## 2017-01-03 ENCOUNTER — Ambulatory Visit (INDEPENDENT_AMBULATORY_CARE_PROVIDER_SITE_OTHER): Payer: Medicare Other | Admitting: Internal Medicine

## 2017-01-03 ENCOUNTER — Encounter: Payer: Self-pay | Admitting: Internal Medicine

## 2017-01-03 VITALS — BP 127/72 | HR 70 | Temp 97.8°F | Ht 61.0 in | Wt 95.2 lb

## 2017-01-03 DIAGNOSIS — K900 Celiac disease: Secondary | ICD-10-CM | POA: Diagnosis not present

## 2017-01-03 DIAGNOSIS — K219 Gastro-esophageal reflux disease without esophagitis: Secondary | ICD-10-CM

## 2017-01-03 NOTE — Patient Instructions (Addendum)
Continue Gluten free diet - information provided  Continue omeprazole daily  Miralax daily as needed  Office visit in 1 year and as needed

## 2017-01-03 NOTE — Progress Notes (Signed)
Primary Care Physician:  Wende Neighbors, MD Primary Gastroenterologist:  Dr. Gala Romney  Pre-Procedure History & Physical: HPI:  Felicia Harvey is a 83 y.o. female here for follow-up of GERD and celiac disease. Doing good with a gluten-free diet. No longer on bisphosphonate therapy due to age. On calcium vitamin D. On omeprazole 40 mg daily with good control of reflux symptoms. Adherent to a gluten-free diet. No dysphagia. Weight stable. Occasional constipation responsive to MiraLAX.  Past Medical History:  Diagnosis Date  . Anxiety   . Celiac disease   . Chest pain 09/21/2011   OCCASIONAL  . Constipation   . Difficulty sleeping    TAKES MED TO SLEEP  . Dysphagia 09/21/2011  . Dysrhythmia    "heart arrthymia" per son, but unsure what kind  . GERD (gastroesophageal reflux disease)   . HTN (hypertension)   . Hypothyroidism   . Knee fracture, left 03/2012  . Osteopenia   . Ovarian mass   . Polydipsia 09/21/2011  . 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 Schizophrenia ([place])    FAMILY UNSURE OF THIS  . Thyroid disease     Past Surgical History:  Procedure Laterality Date  . ESOPHAGOGASTRODUODENOSCOPY  09/27/2011   Inflammatory changes midesophagus most likely pill-induced injury (Fosamax), large diaphragmatic hernia, biopsies from small bowel consistent with celiac disease  . ESOPHAGOGASTRODUODENOSCOPY (EGD) WITH ESOPHAGEAL DILATION N/A 07/13/2013   [id number] esophagitis-more likely pill-induced s/p esophageal biopsy. Noncritical. Schatzki's ring s/p passage of a Maloney dilator. Hiatal hernia. Abnormal duodenum consistent with prior diagnosis of celiac disease. Status post 104 French Maloney dilator. Esophageal biopsies benign showed marked acute and chronic inflammation.  Marland Kitchen LAPAROSCOPIC SALPINGO OOPHERECTOMY Bilateral 11/16/2013   Procedure: LAPAROSCOPIC BILATERAL SALPINGO OOPHORECTOMY;  Surgeon: Imagene Gurney A. Alycia Rossetti, MD;  Location: [facility];  Service: Gynecology;  Laterality: Bilateral;  . left  hip replacement    . left shoulder    . THYROIDECTOMY    . VERTEBROPLASTY      Prior to Admission medications   Medication Sig Start Date End Date Taking? Authorizing Provider  acetaminophen (TYLENOL) 500 MG tablet Take 325 mg by mouth every 6 (six) hours as needed for pain.    Yes Historical Provider, MD  calcium carbonate (OS-CAL - DOSED IN MG OF ELEMENTAL CALCIUM) 1250 MG tablet Take 1 tablet by mouth every morning.   Yes Historical Provider, MD  denosumab (PROLIA) 60 MG/ML SOLN injection Inject 60 mg into the skin every 6 (six) months. Administer in upper arm, thigh, or abdomen   Yes Historical Provider, MD  diclofenac (VOLTAREN) 50 MG EC tablet Take 50 mg by mouth daily.    Yes Historical Provider, MD  Iron-FA-B Cmp-C-Biot-Probiotic (FUSION PLUS PO) Take 1 capsule by mouth daily.    Yes Historical Provider, MD  levothyroxine (SYNTHROID, LEVOTHROID) 75 MCG tablet Take 75 mcg by mouth every morning.   Yes Historical Provider, MD  lisinopril (PRINIVIL,ZESTRIL) 40 MG tablet Take 40 mg by mouth every morning.    Yes Historical Provider, MD  omeprazole (PRILOSEC) 20 MG capsule TAKE ONE CAPSULE BY MOUTH TWICE DAILY. 11/26/16  Yes Annitta Needs, NP  polyethylene glycol [facility] / GLYCOLAX) packet Take 17 g by mouth daily. 08/25/13  Yes Sherwood Gambler, MD  QUEtiapine (SEROQUEL) 200 MG tablet Take 200 mg by mouth at bedtime.   Yes Historical Provider, MD    Allergies as of 01/03/2017 - Review Complete 08/09/2016  Allergen Reaction Noted  . Gluten  09/20/2011    Family History  Problem Relation Age of Onset  . Cancer Sister     brain tumor  . Hypertension Brother   . Hypertension Daughter   . Kidney disease Sister     Social History   Social History  . Marital status: Divorced    Spouse name: N/A  . Number of children: N/A  . Years of education: 12th   Occupational History  . Retired Retired   Social History Main Topics  . Smoking status: Never Smoker  . Smokeless tobacco:  Never Used  . Alcohol use No     Comment: rarely  . Drug use: No  . Sexual activity: Not Currently    Birth control/ protection: Post-menopausal   Other Topics Concern  . Not on file   Social History Narrative  . No narrative on file    Review of Systems: See HPI, otherwise negative ROS  Physical Exam: BP 127/72   Pulse 70   Temp 97.8 F (36.6 C) (Oral)   Ht 5\' 1"  (1.549 m)   Wt 95 lb 3.2 oz (43.2 kg)   BMI 17.99 kg/m  General:   Small framed elderly lady resting comfortably; currently accompanied by her son.  Abdomen: Non-distended, normal bowel sounds.  Soft and nontender without appreciable mass or hepatosplenomegaly.   Impression:  Pleasant 83 year old lady with celiac disease and GERD. Both appear well-controlled on current regimen. No further GI evaluation warranted.  Also, constipation well-controlled with when necessary use of MiraLAX.  Continue Gluten free diet - information provided  Continue omeprazole daily  Miralax daily as needed  Office visit in 1 year and as needed    Notice: This dictation was prepared with Dragon dictation along with smaller phrase technology. Any transcriptional errors that result from this process are unintentional and may not be corrected upon review.

## 2017-01-10 ENCOUNTER — Encounter (HOSPITAL_COMMUNITY): Payer: Self-pay

## 2017-01-10 ENCOUNTER — Emergency Department (HOSPITAL_COMMUNITY): Payer: Medicare Other

## 2017-01-10 ENCOUNTER — Telehealth: Payer: Self-pay | Admitting: Gastroenterology

## 2017-01-10 ENCOUNTER — Emergency Department (HOSPITAL_COMMUNITY)
Admission: EM | Admit: 2017-01-10 | Discharge: 2017-01-10 | Disposition: A | Discharge status: Home / Self Care | Payer: Medicare Other | Attending: Emergency Medicine | Admitting: Emergency Medicine

## 2017-01-10 DIAGNOSIS — Z79899 Other long term (current) drug therapy: Secondary | ICD-10-CM | POA: Diagnosis not present

## 2017-01-10 DIAGNOSIS — F209 Schizophrenia, unspecified: Secondary | ICD-10-CM | POA: Insufficient documentation

## 2017-01-10 DIAGNOSIS — E039 Hypothyroidism, unspecified: Secondary | ICD-10-CM | POA: Insufficient documentation

## 2017-01-10 DIAGNOSIS — R1310 Dysphagia, unspecified: Secondary | ICD-10-CM | POA: Insufficient documentation

## 2017-01-10 DIAGNOSIS — I10 Essential (primary) hypertension: Secondary | ICD-10-CM | POA: Diagnosis not present

## 2017-01-10 DIAGNOSIS — J984 Other disorders of lung: Secondary | ICD-10-CM | POA: Diagnosis not present

## 2017-01-10 MED ORDER — LIDOCAINE VISCOUS 2 % MT SOLN: 10.0000 mL | Freq: Three times a day (TID) | OROMUCOSAL | 0 refills | Status: DC

## 2017-01-10 MED ORDER — LIDOCAINE VISCOUS 2 % MT SOLN
10.0000 mL | Freq: Once | OROMUCOSAL | Status: AC
  Administered 2017-01-10: 10 mL via OROMUCOSAL
  Filled 2017-01-10: qty 15

## 2017-01-10 NOTE — ED Notes (Signed)
Awaiting disposition.

## 2017-01-10 NOTE — ED Notes (Signed)
Pt is up for DC, son at the desk several times requesting DC- awaiting paperwork

## 2017-01-10 NOTE — ED Notes (Signed)
Eating lunch with her son when she became choked on chicken she was eating- cleared it but went home and now feels her throat is sore and she has ear pain

## 2017-01-10 NOTE — ED Provider Notes (Signed)
[facility] DEPT Provider Note   CSN: [id number] Arrival date & time: 01/10/17  1357     History   Chief Complaint Chief Complaint  Patient presents with  . Dysphagia    HPI Felicia Harvey is a 83 y.o. female.  HPI She has history of esophageal strictures needing dilation from time to time. She is followed by Dr. Gala Romney. Patient states she was at lunch today and had a choking episode. Son states the patient turned red in the face. This eventually passed. She then attempted to take medication at home and had similar coughing/choking episode. She has not taken anything by mouth since that trying to take medication earlier. Denies difficulty breathing or drooling. Patient complains of left ear pain which may have been present prior to coughing episode. Denies using Q-tips. Denies hearing changes. No focal weakness or numbness. Past Medical History:  Diagnosis Date  . Anxiety   . Celiac disease   . Chest pain 09/21/2011   OCCASIONAL  . Constipation   . Difficulty sleeping    TAKES MED TO SLEEP  . Dysphagia 09/21/2011  . Dysrhythmia    "heart arrthymia" per son, but unsure what kind  . GERD (gastroesophageal reflux disease)   . HTN (hypertension)   . Hypothyroidism   . Knee fracture, left 03/2012  . Osteopenia   . Ovarian mass   . Polydipsia 09/21/2011  . 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 Schizophrenia ([place])    FAMILY UNSURE OF THIS  . Thyroid disease     Patient Active Problem List   Diagnosis Date Noted  . Constipation 08/18/2015  . Mass, ovarian 11/24/2013  . Esophagitis due to drug 10/19/2013  . Colon cancer screening 10/19/2013  . Bradycardia 06/05/2012  . Fracture of patella, left, closed 04/01/2012  . Fractured pelvis ([place]) 11/19/2011  . Celiac disease 11/13/2011  . Weakness generalized 09/27/2011  . Hyponatremia 09/27/2011  . 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 Dehydration 09/27/2011  . Anemia 09/21/2011  . Fall 09/21/2011  . Pelvic fracture ([place])  09/21/2011  . Emphysema 09/21/2011  . Dysphagia 09/21/2011  . Chest pain 09/21/2011  . SIADH (syndrome of inappropriate ADH production) ([place]) 09/21/2011  . Hematoma 09/21/2011  . GERD (gastroesophageal reflux disease) 09/21/2011  . Hypertension 09/20/2011  . Hypothyroidism 09/20/2011  . Depression 09/20/2011  . Headache(784.0) 09/20/2011    Past Surgical History:  Procedure Laterality Date  . ESOPHAGOGASTRODUODENOSCOPY  09/27/2011   Inflammatory changes midesophagus most likely pill-induced injury (Fosamax), large diaphragmatic hernia, biopsies from small bowel consistent with celiac disease  . ESOPHAGOGASTRODUODENOSCOPY (EGD) WITH ESOPHAGEAL DILATION N/A 07/13/2013   [id number] esophagitis-more likely pill-induced s/p esophageal biopsy. Noncritical. Schatzki's ring s/p passage of a Maloney dilator. Hiatal hernia. Abnormal duodenum consistent with prior diagnosis of celiac disease. Status post 84 French Maloney dilator. Esophageal biopsies benign showed marked acute and chronic inflammation.  Marland Kitchen LAPAROSCOPIC SALPINGO OOPHERECTOMY Bilateral 11/16/2013   Procedure: LAPAROSCOPIC BILATERAL SALPINGO OOPHORECTOMY;  Surgeon: Imagene Gurney A. Alycia Rossetti, MD;  Location: [facility];  Service: Gynecology;  Laterality: Bilateral;  . left hip replacement    . left shoulder    . THYROIDECTOMY    . VERTEBROPLASTY      OB History    No data available       Home Medications    Prior to Admission medications   Medication Sig Start Date End Date Taking? Authorizing Provider  calcium carbonate (OS-CAL - DOSED IN MG OF ELEMENTAL CALCIUM) 1250 MG tablet Take 1 tablet by mouth every morning.  Yes Historical Provider, MD  diclofenac (VOLTAREN) 50 MG EC tablet Take 50 mg by mouth daily.    Yes Historical Provider, MD  Iron-FA-B Cmp-C-Biot-Probiotic (FUSION PLUS PO) Take 1 capsule by mouth daily.    Yes Historical Provider, MD  levothyroxine (SYNTHROID, LEVOTHROID) 75 MCG tablet Take 75 mcg by mouth every morning.   Yes  Historical Provider, MD  lisinopril (PRINIVIL,ZESTRIL) 40 MG tablet Take 40 mg by mouth every morning.    Yes Historical Provider, MD  omeprazole (PRILOSEC) 20 MG capsule TAKE ONE CAPSULE BY MOUTH TWICE DAILY. 11/26/16  Yes Annitta Needs, NP  polyethylene glycol [facility] / GLYCOLAX) packet Take 17 g by mouth daily. 08/25/13  Yes Sherwood Gambler, MD  QUEtiapine (SEROQUEL) 200 MG tablet Take 200 mg by mouth at bedtime.   Yes Historical Provider, MD  lidocaine (XYLOCAINE) 2 % solution Use as directed 10 mLs in the mouth or throat 4 (four) times daily -  before meals and at bedtime. 01/10/17   Julianne Rice, MD    Family History Family History  Problem Relation Age of Onset  . Cancer Sister     brain tumor  . Hypertension Daughter   . Kidney disease Sister   . Hypertension Brother     Social History Social History  Substance Use Topics  . Smoking status: Never Smoker  . Smokeless tobacco: Never Used  . Alcohol use No     Comment: rarely     Allergies   Gluten   Review of Systems Review of Systems  Constitutional: Negative for chills and fever.  HENT: Positive for trouble swallowing. Negative for facial swelling and voice change.   Eyes: Negative for visual disturbance.  Respiratory: Positive for cough and choking.   Cardiovascular: Negative for chest pain.  Gastrointestinal: Negative for abdominal pain, nausea and vomiting.  Musculoskeletal: Negative for back pain and neck pain.  Skin: Negative for rash and wound.  Neurological: Negative for dizziness, syncope, weakness, light-headedness, numbness and headaches.  All other systems reviewed and are negative.    Physical Exam Updated Vital Signs BP (!) 189/101   Pulse 65   Temp 97.5 F (36.4 C) (Oral)   Resp 16   Ht 4\' 11"  (1.499 m)   Wt 95 lb (43.1 kg)   SpO2 98%   BMI 19.19 kg/m   Physical Exam  Constitutional: She is oriented to person, place, and time. She appears well-developed and well-nourished. No distress.   HENT:  Head: Normocephalic and atraumatic.  Mouth/Throat: Oropharynx is clear and moist.  Oropharynx is clear. Uvula is midline. Patient has small abrasion in the inferior surface of the external auditory canal of the left ear. Bilateral TMs are intact. Patient is tolerating secretions.  Eyes: EOM are normal. Pupils are equal, round, and reactive to light.  Neck: Normal range of motion. Neck supple.  Cardiovascular: Normal rate and regular rhythm.  Exam reveals no gallop and no friction rub.   No murmur heard. Pulmonary/Chest: Effort normal and breath sounds normal. No stridor. No respiratory distress. She has no wheezes. She has no rales. She exhibits no tenderness.  Abdominal: Soft. Bowel sounds are normal. There is no tenderness. There is no rebound and no guarding.  Musculoskeletal: Normal range of motion. She exhibits no edema or tenderness.  Neurological: She is alert and oriented to person, place, and time.  Patient is alert and oriented x3 with clear, goal oriented speech. Patient has 5/5 motor in all extremities. Sensation is intact to light touch.  Patient has a normal gait and walks without assistance.  Skin: Skin is warm and dry. No rash noted. No erythema.  Psychiatric: She has a normal mood and affect. Her behavior is normal.  Nursing note and vitals reviewed.    ED Treatments / Results  Labs (all labs ordered are listed, but only abnormal results are displayed) Labs Reviewed - No data to display  EKG  EKG Interpretation None       Radiology Dg Chest 2 View  Result Date: 01/10/2017 CLINICAL DATA:  Pain in throat after choking episode. EXAM: CHEST  2 VIEW COMPARISON:  December 08, 2015 FINDINGS: The cardiomediastinal silhouette is stable. No pneumothorax. The thoracic spine is stable. Linear density in the lateral right midlung may be associated with a rib. This was not appreciated previously. No other acute abnormalities. IMPRESSION: Linear density in the lateral right  midlung may be associated with an age-indeterminate rib injury, not seen previously. The difference could be due to positioning and rotation. No other acute abnormalities identified. Electronically Signed   By: Dorise Bullion III M.D   On: 01/10/2017 16:09    Procedures Procedures (including critical care time)  Medications Ordered in ED Medications  lidocaine (XYLOCAINE) 2 % viscous mouth solution 10 mL (10 mLs Mouth/Throat Given 01/10/17 1729)     Initial Impression / Assessment and Plan / ED Course  I have reviewed the triage vital signs and the nursing notes.  Pertinent labs & imaging results that were available during my care of the patient were reviewed by me and considered in my medical decision making (see chart for details).   patient was able to swallow fluid in the emergency department with complaint of pain with swallowing. No vomiting or choking. Discussed with Dr. Oneida Alar who recommends starting the patient on viscous lidocaine before meals and bedtime. Stated she would send message to Dr Gala Romney to have the patient followed up closely. Recommended full liquid diet.  Given viscous lidocaine and patient was able to drink fluids with no difficulty or pain. Return precautions given.  Final Clinical Impressions(s) / ED Diagnoses   Final diagnoses:  Odynophagia    New Prescriptions Discharge Medication List as of 01/10/2017  6:06 PM    START taking these medications   Details  lidocaine (XYLOCAINE) 2 % solution Use as directed 10 mLs in the mouth or throat 4 (four) times daily -  before meals and at bedtime., Starting Fri 01/10/2017, Print         Julianne Rice, MD 01/10/17 2250

## 2017-01-10 NOTE — ED Triage Notes (Signed)
Son reports that they were at [employer] and started choking. Pt was able to clear the obstruction by coughing. Pt went home and drank water and began coughing again. Throat is sore and left ear hurts. Has hx of esophageal stretching

## 2017-01-10 NOTE — ED Notes (Signed)
Pt medicated as ordered.  Would like to go home now.  Has removed self from monitor and has gotten dressed.  Sitting in chair with son at bedside.

## 2017-01-10 NOTE — Telephone Encounter (Signed)
PT ATE LUNCH EARLIER TODAY. HAD TROUBLE SWALLOWING. WENT HOME. TROUBLE SWALLOWING PILLS. CAME TO ED DUE TO PAIN WITH SWALLOWING.  TOLERATING HER SECRETION AND WATER WITH SWALLOW TEST.  CXR: PA/LAT-NACPD. DISCUSSED WITH DR. Dorthula Matas. VISCOUS LIDOCAINE QAC AND HS FOR 3 DAYS AND FULL LIQUID DIET. PLEASE CALL OFC ON MON FOR ADDITIONAL INSTRUCTIONS.

## 2017-01-13 ENCOUNTER — Other Ambulatory Visit: Payer: Self-pay

## 2017-01-13 DIAGNOSIS — R1310 Dysphagia, unspecified: Secondary | ICD-10-CM

## 2017-01-13 NOTE — Telephone Encounter (Signed)
I spoke with the pt and the pts son this morning-she choked while at a restaurant on Friday,  pt was on a liquid diet all day Saturday. Yesterday her son gave her some soft foods and she tolerated them well. The pt told me that she wasn't having any pain and no trouble at this time swallowing food, liquids or her pills. I told them that I would let RMR know and if he has any further recommendations, I would call them back.

## 2017-01-13 NOTE — Telephone Encounter (Signed)
Patient is to stay on a dysphagia 2 diet. Let's repeat a barium esophagram.

## 2017-01-13 NOTE — Telephone Encounter (Signed)
Tried to call pt- NA-no voicemail, tried to call pts son-William- NA-no voicemail.

## 2017-01-13 NOTE — Telephone Encounter (Signed)
DG Esophagus scheduled for 01/17/17 at 11:00am, arrive at 10:45am at [facility]. NPO 3 hours prior to test. Called and informed pt's son Gwyndolyn Saxon). Pt was with him and he told her. Advised for pt to have soft foods.

## 2017-01-17 ENCOUNTER — Ambulatory Visit (HOSPITAL_COMMUNITY)
Admission: RE | Admit: 2017-01-17 | Discharge: 2017-01-17 | Disposition: A | Discharge status: Home / Self Care | Payer: Medicare Other | Source: Ambulatory Visit | Attending: Internal Medicine | Admitting: Internal Medicine

## 2017-01-17 DIAGNOSIS — K224 Dyskinesia of esophagus: Secondary | ICD-10-CM | POA: Insufficient documentation

## 2017-01-17 DIAGNOSIS — R1310 Dysphagia, unspecified: Secondary | ICD-10-CM

## 2017-03-26 ENCOUNTER — Other Ambulatory Visit: Payer: Self-pay | Admitting: Gastroenterology

## 2017-03-30 ENCOUNTER — Observation Stay (HOSPITAL_COMMUNITY)
Admission: EM | Admit: 2017-03-30 | Discharge: 2017-03-31 | Disposition: A | Discharge status: Home / Self Care | Payer: Medicare Other | Attending: Internal Medicine | Admitting: Internal Medicine

## 2017-03-30 ENCOUNTER — Observation Stay (HOSPITAL_COMMUNITY): Payer: Medicare Other

## 2017-03-30 ENCOUNTER — Emergency Department (HOSPITAL_COMMUNITY): Payer: Medicare Other

## 2017-03-30 ENCOUNTER — Encounter (HOSPITAL_COMMUNITY): Payer: Self-pay | Admitting: Emergency Medicine

## 2017-03-30 DIAGNOSIS — M79659 Pain in unspecified thigh: Secondary | ICD-10-CM

## 2017-03-30 DIAGNOSIS — M546 Pain in thoracic spine: Principal | ICD-10-CM | POA: Insufficient documentation

## 2017-03-30 DIAGNOSIS — Z96642 Presence of left artificial hip joint: Secondary | ICD-10-CM | POA: Diagnosis not present

## 2017-03-30 DIAGNOSIS — M549 Dorsalgia, unspecified: Secondary | ICD-10-CM | POA: Diagnosis not present

## 2017-03-30 DIAGNOSIS — G8929 Other chronic pain: Secondary | ICD-10-CM | POA: Insufficient documentation

## 2017-03-30 DIAGNOSIS — M5489 Other dorsalgia: Secondary | ICD-10-CM | POA: Diagnosis not present

## 2017-03-30 DIAGNOSIS — G309 Alzheimer's disease, unspecified: Secondary | ICD-10-CM | POA: Diagnosis not present

## 2017-03-30 DIAGNOSIS — M25562 Pain in left knee: Secondary | ICD-10-CM | POA: Diagnosis not present

## 2017-03-30 DIAGNOSIS — M545 Low back pain: Secondary | ICD-10-CM | POA: Diagnosis not present

## 2017-03-30 DIAGNOSIS — M25552 Pain in left hip: Secondary | ICD-10-CM | POA: Diagnosis not present

## 2017-03-30 DIAGNOSIS — E039 Hypothyroidism, unspecified: Secondary | ICD-10-CM | POA: Diagnosis not present

## 2017-03-30 DIAGNOSIS — I10 Essential (primary) hypertension: Secondary | ICD-10-CM | POA: Diagnosis not present

## 2017-03-30 DIAGNOSIS — F0390 Unspecified dementia without behavioral disturbance: Secondary | ICD-10-CM | POA: Diagnosis present

## 2017-03-30 DIAGNOSIS — Z79899 Other long term (current) drug therapy: Secondary | ICD-10-CM | POA: Diagnosis not present

## 2017-03-30 DIAGNOSIS — F0280 Dementia in other diseases classified elsewhere without behavioral disturbance: Secondary | ICD-10-CM | POA: Diagnosis not present

## 2017-03-30 HISTORY — DX: Unspecified dementia, unspecified severity, without behavioral disturbance, psychotic disturbance, mood disturbance, and anxiety: F03.90

## 2017-03-30 LAB — BASIC METABOLIC PANEL
Anion gap: 9 (ref 5–15)
BUN: 12 mg/dL (ref 6–20)
CALCIUM: 8.8 mg/dL — AB (ref 8.9–10.3)
CO2: 24 mmol/L (ref 22–32)
Chloride: 99 mmol/L — ABNORMAL LOW (ref 101–111)
Creatinine, Ser: 0.64 mg/dL (ref 0.44–1.00)
GFR calc non Af Amer: >60 mL/min (ref >60)
Glucose, Bld: 106 mg/dL — ABNORMAL HIGH (ref 65–99)
POTASSIUM: 3.4 mmol/L — AB (ref 3.5–5.1)
Sodium: 132 mmol/L — ABNORMAL LOW (ref 135–145)

## 2017-03-30 LAB — CBC WITH DIFFERENTIAL/PLATELET
BASOS PCT: 0 %
Basophils Absolute: 0 10*3/uL (ref 0.0–0.1)
EOS PCT: 0 %
Eosinophils Absolute: 0 10*3/uL (ref 0.0–0.7)
HCT: 40.1 % (ref 36.0–46.0)
Hemoglobin: 14 g/dL (ref 12.0–15.0)
Lymphocytes Relative: 5 %
Lymphs Abs: 0.6 10*3/uL — ABNORMAL LOW (ref 0.7–4.0)
MCH: 31.5 pg (ref 26.0–34.0)
MCHC: 34.9 g/dL (ref 30.0–36.0)
MCV: 90.3 fL (ref 78.0–100.0)
MONO ABS: 0.5 10*3/uL (ref 0.1–1.0)
Monocytes Relative: 4 %
NEUTROS ABS: 10.3 10*3/uL — AB (ref 1.7–7.7)
Neutrophils Relative %: 91 %
PLATELETS: 190 10*3/uL (ref 150–400)
RBC: 4.44 MIL/uL (ref 3.87–5.11)
RDW: 12.7 % (ref 11.5–15.5)
WBC: 11.4 10*3/uL — ABNORMAL HIGH (ref 4.0–10.5)

## 2017-03-30 LAB — URINALYSIS, ROUTINE W REFLEX MICROSCOPIC
Bilirubin Urine: NEGATIVE
Glucose, UA: NEGATIVE mg/dL
HGB URINE DIPSTICK: NEGATIVE
Ketones, ur: NEGATIVE mg/dL
NITRITE: NEGATIVE
PH: 8 (ref 5.0–8.0)
Protein, ur: NEGATIVE mg/dL
Specific Gravity, Urine: 1.005 (ref 1.005–1.030)

## 2017-03-30 LAB — TSH: TSH: 1.008 u[IU]/mL (ref 0.350–4.500)

## 2017-03-30 MED ORDER — MORPHINE SULFATE (PF) 2 MG/ML IV SOLN
2.0000 mg | INTRAVENOUS | Status: DC | PRN
  Administered 2017-03-31 (×2): 2 mg via INTRAVENOUS
  Filled 2017-03-30 (×2): qty 1

## 2017-03-30 MED ORDER — ONDANSETRON HCL 4 MG/2ML IJ SOLN: 4.0000 mg | Freq: Four times a day (QID) | INTRAMUSCULAR | Status: DC | PRN

## 2017-03-30 MED ORDER — MORPHINE SULFATE (PF) 4 MG/ML IV SOLN
4.0000 mg | INTRAVENOUS | Status: DC | PRN
Start: 2017-03-30 — End: 2017-03-31

## 2017-03-30 MED ORDER — ACETAMINOPHEN 325 MG PO TABS: 650.0000 mg | ORAL_TABLET | Freq: Four times a day (QID) | ORAL | Status: DC | PRN

## 2017-03-30 MED ORDER — LISINOPRIL 10 MG PO TABS
40.0000 mg | ORAL_TABLET | ORAL | Status: DC
  Filled 2017-03-30: qty 4

## 2017-03-30 MED ORDER — HYDRALAZINE HCL 20 MG/ML IJ SOLN
5.0000 mg | Freq: Four times a day (QID) | INTRAMUSCULAR | Status: DC | PRN
  Administered 2017-03-30: 5 mg via INTRAVENOUS
  Filled 2017-03-30: qty 1

## 2017-03-30 MED ORDER — ENOXAPARIN SODIUM 40 MG/0.4ML ~~LOC~~ SOLN
40.0000 mg | SUBCUTANEOUS | Status: DC
  Administered 2017-03-30: 40 mg via SUBCUTANEOUS
  Filled 2017-03-30 (×2): qty 0.4

## 2017-03-30 MED ORDER — FENTANYL CITRATE (PF) 100 MCG/2ML IJ SOLN
50.0000 ug | Freq: Once | INTRAMUSCULAR | Status: AC
  Administered 2017-03-30: 50 ug via INTRAVENOUS
  Filled 2017-03-30: qty 2

## 2017-03-30 MED ORDER — LEVOTHYROXINE SODIUM 75 MCG PO TABS
75.0000 ug | ORAL_TABLET | Freq: Every day | ORAL | Status: DC
  Filled 2017-03-30: qty 1

## 2017-03-30 MED ORDER — QUETIAPINE FUMARATE 100 MG PO TABS
200.0000 mg | ORAL_TABLET | Freq: Every day | ORAL | Status: DC
  Administered 2017-03-30: 200 mg via ORAL
  Filled 2017-03-30: qty 2

## 2017-03-30 MED ORDER — MORPHINE SULFATE (PF) 4 MG/ML IV SOLN
2.0000 mg | Freq: Once | INTRAVENOUS | Status: AC
  Administered 2017-03-30: 2 mg via INTRAVENOUS
  Filled 2017-03-30: qty 1

## 2017-03-30 MED ORDER — DOCUSATE SODIUM 100 MG PO CAPS
100.0000 mg | ORAL_CAPSULE | Freq: Two times a day (BID) | ORAL | Status: DC
  Filled 2017-03-30 (×2): qty 1

## 2017-03-30 MED ORDER — PANTOPRAZOLE SODIUM 40 MG PO TBEC
40.0000 mg | DELAYED_RELEASE_TABLET | Freq: Every day | ORAL | Status: DC
  Filled 2017-03-30 (×2): qty 1

## 2017-03-30 MED ORDER — ACETAMINOPHEN 650 MG RE SUPP: 650.0000 mg | Freq: Four times a day (QID) | RECTAL | Status: DC | PRN

## 2017-03-30 MED ORDER — ONDANSETRON HCL 4 MG PO TABS: 4.0000 mg | ORAL_TABLET | Freq: Four times a day (QID) | ORAL | Status: DC | PRN

## 2017-03-30 MED ORDER — METHOCARBAMOL 1000 MG/10ML IJ SOLN
500.0000 mg | Freq: Four times a day (QID) | INTRAVENOUS | Status: DC | PRN
  Filled 2017-03-30: qty 5

## 2017-03-30 MED ORDER — POLYETHYLENE GLYCOL 3350 17 G PO PACK
17.0000 g | PACK | Freq: Every day | ORAL | Status: DC
  Filled 2017-03-30 (×2): qty 1

## 2017-03-30 MED ORDER — ORAL CARE MOUTH RINSE
15.0000 mL | Freq: Two times a day (BID) | OROMUCOSAL | Status: DC
  Administered 2017-03-30 – 2017-03-31 (×2): 15 mL via OROMUCOSAL

## 2017-03-30 NOTE — ED Notes (Signed)
Pt not able to get out of bed to ambulate.

## 2017-03-30 NOTE — ED Triage Notes (Addendum)
Pt brought in EMS, pt reports intense lower back pain intermittent for last several months. Pt denies any recent injury. Per EMS, pt son has been treating pain with otc icy hot with minimal relief. Pt denies gi/gu symptoms. nad noted.

## 2017-03-30 NOTE — ED Notes (Signed)
Patient to 300.  NAD noted.

## 2017-03-30 NOTE — ED Notes (Signed)
When attempting ambulation, pt managed to sit up in the bed, but then stated "I can't do it" and complained of pain. RN notified.

## 2017-03-30 NOTE — ED Provider Notes (Signed)
[facility] DEPT Provider Note   CSN: [id number] Arrival date & time: 03/30/17  1234     History   Chief Complaint Chief Complaint  Patient presents with  . Back Pain    HPI Felicia Harvey is a 83 y.o. female.  The history is provided by the patient.  Back Pain   This is a chronic problem. The current episode started more than 1 week ago. The problem occurs constantly. The problem has been gradually worsening. The pain is associated with no known injury. The pain is present in the thoracic spine and lumbar spine. The quality of the pain is described as stabbing. The pain does not radiate. The pain is at a severity of 10/10. The pain is severe. The symptoms are aggravated by bending, twisting and certain positions. The pain is the same all the time. Pertinent negatives include no chest pain, no fever, no numbness, no abdominal pain, no abdominal swelling, no bowel incontinence, no perianal numbness, no bladder incontinence, no dysuria, no leg pain, no paresthesias, no paresis and no weakness. She has tried analgesics for the symptoms. The treatment provided no relief. Risk factors include a history of osteoporosis.   83 year old female who presents with diffuse back pain. She has a history of osteoporosis, hypertension, and prior history of lumbar vertebral plasty. States that she has been having diffuse back pain since January, but over the past few weeks symptoms have acutely been worsening. Has been taking Tylenol without improvement. Denies any focal numbness or weakness, bowel or urinary incontinence. Pain is worse with movement and walking.  Past Medical History:  Diagnosis Date  . Anxiety   . Celiac disease   . Chest pain 09/21/2011   OCCASIONAL  . Constipation   . Difficulty sleeping    TAKES MED TO SLEEP  . Dysphagia 09/21/2011  . Dysrhythmia    "heart arrthymia" per son, but unsure what kind  . GERD (gastroesophageal reflux disease)   . HTN (hypertension)   .  Hypothyroidism   . Knee fracture, left 03/2012  . Osteopenia   . Ovarian mass   . Polydipsia 09/21/2011  . 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 Schizophrenia ([place])    FAMILY UNSURE OF THIS  . Thyroid disease     Patient Active Problem List   Diagnosis Date Noted  . Constipation 08/18/2015  . Mass, ovarian 11/24/2013  . Esophagitis due to drug 10/19/2013  . Colon cancer screening 10/19/2013  . Bradycardia 06/05/2012  . Fracture of patella, left, closed 04/01/2012  . Fractured pelvis ([place]) 11/19/2011  . Celiac disease 11/13/2011  . Weakness generalized 09/27/2011  . Hyponatremia 09/27/2011  . 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 Dehydration 09/27/2011  . Anemia 09/21/2011  . Fall 09/21/2011  . Pelvic fracture ([place]) 09/21/2011  . Emphysema 09/21/2011  . Dysphagia 09/21/2011  . Chest pain 09/21/2011  . SIADH (syndrome of inappropriate ADH production) ([place]) 09/21/2011  . Hematoma 09/21/2011  . GERD (gastroesophageal reflux disease) 09/21/2011  . Hypertension 09/20/2011  . Hypothyroidism 09/20/2011  . Depression 09/20/2011  . Headache(784.0) 09/20/2011    Past Surgical History:  Procedure Laterality Date  . ESOPHAGOGASTRODUODENOSCOPY  09/27/2011   Inflammatory changes midesophagus most likely pill-induced injury (Fosamax), large diaphragmatic hernia, biopsies from small bowel consistent with celiac disease  . ESOPHAGOGASTRODUODENOSCOPY (EGD) WITH ESOPHAGEAL DILATION N/A 07/13/2013   DPO:EUMPNT esophagitis-more likely pill-induced s/p esophageal biopsy. Noncritical. Schatzki's ring s/p passage of a Maloney dilator. Hiatal hernia. Abnormal duodenum consistent with prior diagnosis of celiac disease. Status post  65 French Maloney dilator. Esophageal biopsies benign showed marked acute and chronic inflammation.  Marland Kitchen LAPAROSCOPIC SALPINGO OOPHERECTOMY Bilateral 11/16/2013   Procedure: LAPAROSCOPIC BILATERAL SALPINGO OOPHORECTOMY;  Surgeon: Imagene Gurney A. Alycia Rossetti, MD;  Location: [facility] ORS;   Service: Gynecology;  Laterality: Bilateral;  . left hip replacement    . left shoulder    . THYROIDECTOMY    . VERTEBROPLASTY      OB History    No data available       Home Medications    Prior to Admission medications   Medication Sig Start Date End Date Taking? Authorizing Provider  acetaminophen (TYLENOL) 325 MG tablet Take 650 mg by mouth every 6 (six) hours as needed.   Yes Historical Provider, MD  calcium carbonate (OS-CAL - DOSED IN MG OF ELEMENTAL CALCIUM) 1250 MG tablet Take 1 tablet by mouth every morning.   Yes Historical Provider, MD  diclofenac (VOLTAREN) 50 MG EC tablet Take 50 mg by mouth daily.    Yes Historical Provider, MD  Iron-FA-B Cmp-C-Biot-Probiotic (FUSION PLUS PO) Take 1 capsule by mouth daily.    Yes Historical Provider, MD  levothyroxine (SYNTHROID, LEVOTHROID) 75 MCG tablet Take 75 mcg by mouth every morning.   Yes Historical Provider, MD  lisinopril (PRINIVIL,ZESTRIL) 40 MG tablet Take 40 mg by mouth every morning.    Yes Historical Provider, MD  omeprazole (PRILOSEC) 20 MG capsule TAKE ONE CAPSULE BY MOUTH TWICE DAILY. 03/27/17  Yes Carlis Stable, NP  polyethylene glycol (MIRALAX / GLYCOLAX) packet Take 17 g by mouth daily. 08/25/13  Yes Sherwood Gambler, MD  QUEtiapine (SEROQUEL) 200 MG tablet Take 200 mg by mouth at bedtime.   Yes Historical Provider, MD  lidocaine (XYLOCAINE) 2 % solution Use as directed 10 mLs in the mouth or throat 4 (four) times daily -  before meals and at bedtime. Patient not taking: Reported on 03/30/2017 01/10/17   Julianne Rice, MD    Family History Family History  Problem Relation Age of Onset  . Cancer Sister     brain tumor  . Hypertension Daughter   . Kidney disease Sister   . Hypertension Brother     Social History Social History  Substance Use Topics  . Smoking status: Never Smoker  . Smokeless tobacco: Never Used  . Alcohol use No     Comment: rarely     Allergies   Gluten   Review of Systems Review of  Systems  Constitutional: Negative for fever.  Respiratory: Negative for cough and shortness of breath.   Cardiovascular: Negative for chest pain.  Gastrointestinal: Negative for abdominal pain and bowel incontinence.  Genitourinary: Negative for bladder incontinence and dysuria.  Musculoskeletal: Positive for back pain.  Skin: Negative for wound.  Allergic/Immunologic: Negative for immunocompromised state.  Neurological: Negative for weakness, numbness and paresthesias.  Hematological: Does not bruise/bleed easily.  All other systems reviewed and are negative.    Physical Exam Updated Vital Signs BP (!) 198/97 (BP Location: Left Arm)   Pulse 62   Temp 97.6 F (36.4 C) (Oral)   Resp 18   Ht 4\' 11"  (1.499 m)   Wt 95 lb (43.1 kg)   SpO2 95%   BMI 19.19 kg/m   Physical Exam Physical Exam  Nursing note and vitals reviewed. Constitutional: Frail non-toxic, and in no acute distress Head: Normocephalic and atraumatic.  Mouth/Throat: Oropharynx is clear and moist.  Neck: Normal range of motion. Neck supple.  Cardiovascular: Normal rate and regular rhythm.   Pulmonary/Chest:  Effort normal and breath sounds normal.  Abdominal: Soft. There is no tenderness. There is no rebound and no guarding.  Musculoskeletal: diffuse TLS spine tenderness without stepoffs Neurological: Alert, no facial droop, fluent speech, moves all extremities symmetrically, full strength in bilateral lower extremities, full sensation to light touch in bilateral lower extremities. Skin: Skin is warm and dry.  Psychiatric: Cooperative   ED Treatments / Results  Labs (all labs ordered are listed, but only abnormal results are displayed) Labs Reviewed  CBC WITH DIFFERENTIAL/PLATELET - Abnormal; Notable for the following:       Result Value   WBC 11.4 (*)    Neutro Abs 10.3 (*)    Lymphs Abs 0.6 (*)    All other components within normal limits  BASIC METABOLIC PANEL - Abnormal; Notable for the following:     Sodium 132 (*)    Potassium 3.4 (*)    Chloride 99 (*)    Glucose, Bld 106 (*)    Calcium 8.8 (*)    All other components within normal limits  URINALYSIS, ROUTINE W REFLEX MICROSCOPIC - Abnormal; Notable for the following:    Leukocytes, UA SMALL (*)    Bacteria, UA RARE (*)    Squamous Epithelial / LPF 0-5 (*)    All other components within normal limits    EKG  EKG Interpretation None       Radiology Ct Thoracic Spine Wo Contrast  Result Date: 03/30/2017 CLINICAL DATA:  Worsening back pain for several weeks. EXAM: CT THORACIC AND LUMBAR SPINE WITHOUT CONTRAST TECHNIQUE: Multidetector CT imaging of the thoracic and lumbar spine was performed without contrast. Multiplanar CT image reconstructions were also generated. COMPARISON:  None. FINDINGS: CT THORACIC SPINE FINDINGS Alignment: Marked kyphosis of the mid thoracic level with convex rightward thoracic scoliosis. Bones are diffusely demineralized. Vertebrae: Compression deformity is noted at T7 and T8 resulting in about 25-50% loss of height anteriorly and 25% loss of height posteriorly age level. Fractures are likely nonacute based on CT imaging features. There may be mild compression deformity of T10. Paraspinal and other soft tissues: No evidence for paraspinal hemorrhage. No evidence for marked bony central canal stenosis. On lung windows show interlobular septal thickening in the upper lobes with bilateral dependent lower lobe collapse/consolidation. Patchy airspace disease noted in both posterior lower lobes. The heart is enlarged. Coronary artery calcification is noted. Atherosclerotic calcification is noted in the wall of the thoracic aorta. Air-fluid level in the esophagus may be related to dysmotility or reflux. Disc levels: Mild loss of disc height is seen at T5-6, T6-7, and T7-T8 as well is T8-9. CT LUMBAR SPINE FINDINGS Segmentation: Normal. Alignment: Convex leftward scoliosis centered at L3-4. Preservation of normal lumbar  lordosis. No subluxation. Vertebrae: Status post vertebral augmentation at L1. Remaining vertebral bodies show normal height. Marked osteopenia. Paraspinal and other soft tissues: 3.1 cm low-density lesion interpolar right kidney approaches water attenuation and is likely a cyst. Large fluid-filled structure identified in the upper central pelvis is presumably the bladder which is distended. Cystic pelvic mass not excluded. Disc levels: Loss of intervertebral disc height with endplate degeneration is seen at L3-4. Remaining lumbar disc spaces are preserved. IMPRESSION: CT THORACIC SPINE IMPRESSION 1. Thoracolumbar scoliosis with marked kyphosis of the thoracic spine. Osteopenia. 2. Mild compression deformity at T7 and T8 without features on today's CT to suggest acute injury. If the patient is a candidate for vertebral augmentation, MRI or nuclear medicine bone scan may prove helpful to better assess acuity/chronicity  of the changes at T7 and T8. 3. Bibasilar collapse/consolidation in the lower lobes. CT LUMBAR SPINE IMPRESSION 1. Status post vertebral augmentation at T12. 2. Loss of disc height with endplate degeneration at L3-4. 3. Marked osteopenia. 4. Abdominal aortic atherosclerosis. Electronically Signed   By: Misty Stanley M.D.   On: 03/30/2017 16:41   Ct Lumbar Spine Wo Contrast  Result Date: 03/30/2017 CLINICAL DATA:  Worsening back pain for several weeks. EXAM: CT THORACIC AND LUMBAR SPINE WITHOUT CONTRAST TECHNIQUE: Multidetector CT imaging of the thoracic and lumbar spine was performed without contrast. Multiplanar CT image reconstructions were also generated. COMPARISON:  None. FINDINGS: CT THORACIC SPINE FINDINGS Alignment: Marked kyphosis of the mid thoracic level with convex rightward thoracic scoliosis. Bones are diffusely demineralized. Vertebrae: Compression deformity is noted at T7 and T8 resulting in about 25-50% loss of height anteriorly and 25% loss of height posteriorly age level.  Fractures are likely nonacute based on CT imaging features. There may be mild compression deformity of T10. Paraspinal and other soft tissues: No evidence for paraspinal hemorrhage. No evidence for marked bony central canal stenosis. On lung windows show interlobular septal thickening in the upper lobes with bilateral dependent lower lobe collapse/consolidation. Patchy airspace disease noted in both posterior lower lobes. The heart is enlarged. Coronary artery calcification is noted. Atherosclerotic calcification is noted in the wall of the thoracic aorta. Air-fluid level in the esophagus may be related to dysmotility or reflux. Disc levels: Mild loss of disc height is seen at T5-6, T6-7, and T7-T8 as well is T8-9. CT LUMBAR SPINE FINDINGS Segmentation: Normal. Alignment: Convex leftward scoliosis centered at L3-4. Preservation of normal lumbar lordosis. No subluxation. Vertebrae: Status post vertebral augmentation at L1. Remaining vertebral bodies show normal height. Marked osteopenia. Paraspinal and other soft tissues: 3.1 cm low-density lesion interpolar right kidney approaches water attenuation and is likely a cyst. Large fluid-filled structure identified in the upper central pelvis is presumably the bladder which is distended. Cystic pelvic mass not excluded. Disc levels: Loss of intervertebral disc height with endplate degeneration is seen at L3-4. Remaining lumbar disc spaces are preserved. IMPRESSION: CT THORACIC SPINE IMPRESSION 1. Thoracolumbar scoliosis with marked kyphosis of the thoracic spine. Osteopenia. 2. Mild compression deformity at T7 and T8 without features on today's CT to suggest acute injury. If the patient is a candidate for vertebral augmentation, MRI or nuclear medicine bone scan may prove helpful to better assess acuity/chronicity of the changes at T7 and T8. 3. Bibasilar collapse/consolidation in the lower lobes. CT LUMBAR SPINE IMPRESSION 1. Status post vertebral augmentation at T12. 2.  Loss of disc height with endplate degeneration at L3-4. 3. Marked osteopenia. 4. Abdominal aortic atherosclerosis. Electronically Signed   By: Misty Stanley M.D.   On: 03/30/2017 16:41    Procedures Procedures (including critical care time)  Medications Ordered in ED Medications  fentaNYL (SUBLIMAZE) injection 50 mcg (50 mcg Intravenous Given 03/30/17 1338)  morphine 4 MG/ML injection 2 mg (2 mg Intravenous Given 03/30/17 1711)     Initial Impression / Assessment and Plan / ED Course  I have reviewed the triage vital signs and the nursing notes.  Pertinent labs & imaging results that were available during my care of the patient were reviewed by me and considered in my medical decision making (see chart for details).     Presenting with escalating back pain over several weeks. Neuro in tact. No concerning features of exam or history to suggest spinal cord compression. Abdomen benign. Has  been HTN, but similarly elevated in 01/2017 document BPs. Give ongoing symptoms for several months, not likely aortic dissection or other acute intrathoracic or intraabdominal processes. CT lumbar and thoracic spine performed to rule out compression fracture or other acute processes. CTs visualized and shows no acute processes, chronic osteopenia. Attempting pain control in ED and ambulation. Re-evaluation signed out to oncoming provider.  Final Clinical Impressions(s) / ED Diagnoses   Final diagnoses:  Chronic midline thoracic back pain  Chronic midline low back pain without sciatica    New Prescriptions New Prescriptions   No medications on file     Forde Dandy, MD 03/30/17 [phone]

## 2017-03-30 NOTE — H&P (Signed)
History and Physical    Felicia Harvey BOF:[id number] DOB: 1929-09-18 DOA: 03/30/2017  PCP: Wende Neighbors, MD Consultants:  Gala Romney - GI Patient coming from: home - lives with son; Donald Prose: son, [phone]  Chief Complaint: back pain  HPI: Felicia Harvey is a 83 y.o. female with medical history significant of hypothyroidism; osteopenia; dementia; HTN; and GERD presenting with acute onset of back pain.  The history is limited by the patient's dementia and her son's attempts to complete her sentences for her.  She was at home cooking breakfast this AM.  She developed stabbing pain in the mid back.  Unable to move or walk since.  Continuous, severe, disabling pain since it started.  No h/o similar.  Thinks she may have moved funny.  No incontinence - urinary or fecal.  Also with pain in left knee/thigh which is new.  She does have a reported h/o kyphoplasty.     ED Course:  Attempted pain control and ambulation but the patient was unable to walk  Review of Systems: As per HPI; otherwise 10 point review of systems reviewed and negative.   Ambulatory Status:  ambulates without assistance  Past Medical History:  Diagnosis Date  . Anxiety   . Celiac disease   . Chest pain 09/21/2011   OCCASIONAL  . Constipation   . Dementia   . Difficulty sleeping    TAKES MED TO SLEEP  . Dysphagia 09/21/2011  . Dysrhythmia    "heart arrthymia" per son, but unsure what kind  . GERD (gastroesophageal reflux disease)   . HTN (hypertension)   . Hypothyroidism   . Knee fracture, left 03/2012  . Osteopenia   . Ovarian mass   . 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 Schizophrenia ([place])    FAMILY UNSURE OF THIS    Past Surgical History:  Procedure Laterality Date  . ESOPHAGOGASTRODUODENOSCOPY  09/27/2011   Inflammatory changes midesophagus most likely pill-induced injury (Fosamax), large diaphragmatic hernia, biopsies from small bowel consistent with celiac disease  . ESOPHAGOGASTRODUODENOSCOPY (EGD) WITH  ESOPHAGEAL DILATION N/A 07/13/2013   RWE:RXVQMG esophagitis-more likely pill-induced s/p esophageal biopsy. Noncritical. Schatzki's ring s/p passage of a Maloney dilator. Hiatal hernia. Abnormal duodenum consistent with prior diagnosis of celiac disease. Status post 67 French Maloney dilator. Esophageal biopsies benign showed marked acute and chronic inflammation.  Marland Kitchen LAPAROSCOPIC SALPINGO OOPHERECTOMY Bilateral 11/16/2013   Procedure: LAPAROSCOPIC BILATERAL SALPINGO OOPHORECTOMY;  Surgeon: Imagene Gurney A. Alycia Rossetti, MD;  Location: [facility];  Service: Gynecology;  Laterality: Bilateral;  . left hip replacement    . left shoulder    . THYROIDECTOMY    . VERTEBROPLASTY      Social History   Social History  . Marital status: Divorced    Spouse name: N/A  . Number of children: N/A  . Years of education: 12th   Occupational History  . Retired Retired   Social History Main Topics  . Smoking status: Never Smoker  . Smokeless tobacco: Never Used  . Alcohol use No     Comment: rarely  . Drug use: No  . Sexual activity: Not Currently    Birth control/ protection: Post-menopausal   Other Topics Concern  . Not on file   Social History Narrative  . No narrative on file    Allergies  Allergen Reactions  . Gluten     Celiac disease    Family History  Problem Relation Age of Onset  . Cancer Sister     brain tumor  . Hypertension Daughter   .  Kidney disease Sister   . Hypertension Brother     Prior to Admission medications   Medication Sig Start Date End Date Taking? Authorizing Provider  acetaminophen (TYLENOL) 325 MG tablet Take 650 mg by mouth every 6 (six) hours as needed.   Yes Historical Provider, MD  calcium carbonate (OS-CAL - DOSED IN MG OF ELEMENTAL CALCIUM) 1250 MG tablet Take 1 tablet by mouth every morning.   Yes Historical Provider, MD  diclofenac (VOLTAREN) 50 MG EC tablet Take 50 mg by mouth daily.    Yes Historical Provider, MD  Iron-FA-B Cmp-C-Biot-Probiotic (FUSION PLUS PO)  Take 1 capsule by mouth daily.    Yes Historical Provider, MD  levothyroxine (SYNTHROID, LEVOTHROID) 75 MCG tablet Take 75 mcg by mouth every morning.   Yes Historical Provider, MD  lisinopril (PRINIVIL,ZESTRIL) 40 MG tablet Take 40 mg by mouth every morning.    Yes Historical Provider, MD  omeprazole (PRILOSEC) 20 MG capsule TAKE ONE CAPSULE BY MOUTH TWICE DAILY. 03/27/17  Yes Carlis Stable, NP  polyethylene glycol (MIRALAX / GLYCOLAX) packet Take 17 g by mouth daily. 08/25/13  Yes Sherwood Gambler, MD  QUEtiapine (SEROQUEL) 200 MG tablet Take 200 mg by mouth at bedtime.   Yes Historical Provider, MD  lidocaine (XYLOCAINE) 2 % solution Use as directed 10 mLs in the mouth or throat 4 (four) times daily -  before meals and at bedtime. Patient not taking: Reported on 03/30/2017 01/10/17   Julianne Rice, MD    Physical Exam: Vitals:   03/30/17 1730 03/30/17 1800 03/30/17 1830 03/30/17 1900  BP: (!) 184/82 (!) 203/93 (!) 203/91 (!) 204/111  Pulse: 62 79 76   Resp:      Temp:      TempSrc:      SpO2: 94% 94% 99%   Weight:      Height:         General: Appears calm and comfortable and is NAD Eyes:  PERRL, EOMI, normal lids, iris ENT:  grossly normal hearing, lips & tongue, mmm Neck:  no LAD, masses or thyromegaly Cardiovascular:  RRR, no m/r/g. No LE edema.  Respiratory:  CTA bilaterally, no w/r/r. Normal respiratory effort. Abdomen:  soft, ntnd, NABS Skin:  no rash or induration seen on limited exam Musculoskeletal:  grossly normal tone BUE/BLE, good ROM, no bony abnormality Psychiatric:  grossly normal mood and affect, speech fluent and appropriate, AOx2, occasionally repeats herself Neurologic:  CN 2-12 grossly intact, moves all extremities in coordinated fashion, sensation intact  Labs on Admission: I have personally reviewed following labs and imaging studies  CBC:  Recent Labs Lab 03/30/17 1348  WBC 11.4*  NEUTROABS 10.3*  HGB 14.0  HCT 40.1  MCV 90.3  PLT 902   Basic  Metabolic Panel:  Recent Labs Lab 03/30/17 1348  NA 132*  K 3.4*  CL 99*  CO2 24  GLUCOSE 106*  BUN 12  CREATININE 0.64  CALCIUM 8.8*   GFR: Estimated Creatinine Clearance: 35 mL/min (by C-G formula based on SCr of 0.64 mg/dL). Liver Function Tests: No results for input(s): AST, ALT, ALKPHOS, BILITOT, PROT, ALBUMIN in the last 168 hours. No results for input(s): LIPASE, AMYLASE in the last 168 hours. No results for input(s): AMMONIA in the last 168 hours. Coagulation Profile: No results for input(s): INR, PROTIME in the last 168 hours. Cardiac Enzymes: No results for input(s): CKTOTAL, CKMB, CKMBINDEX, TROPONINI in the last 168 hours. BNP (last 3 results) No results for input(s): PROBNP in the  last 8760 hours. HbA1C: No results for input(s): HGBA1C in the last 72 hours. CBG: No results for input(s): GLUCAP in the last 168 hours. Lipid Profile: No results for input(s): CHOL, HDL, LDLCALC, TRIG, CHOLHDL, LDLDIRECT in the last 72 hours. Thyroid Function Tests: No results for input(s): TSH, T4TOTAL, FREET4, T3FREE, THYROIDAB in the last 72 hours. Anemia Panel: No results for input(s): VITAMINB12, FOLATE, FERRITIN, TIBC, IRON, RETICCTPCT in the last 72 hours. Urine analysis:    Component Value Date/Time   COLORURINE YELLOW 03/30/2017 [place] 03/30/2017 1347   LABSPEC 1.005 03/30/2017 1347   PHURINE 8.0 03/30/2017 1347   GLUCOSEU NEGATIVE 03/30/2017 1347   HGBUR NEGATIVE 03/30/2017 1347   BILIRUBINUR NEGATIVE 03/30/2017 1347   KETONESUR NEGATIVE 03/30/2017 1347   PROTEINUR NEGATIVE 03/30/2017 1347   UROBILINOGEN 1.0 11/01/2013 1445   NITRITE NEGATIVE 03/30/2017 1347   LEUKOCYTESUR SMALL (A) 03/30/2017 1347    Creatinine Clearance: Estimated Creatinine Clearance: 35 mL/min (by C-G formula based on SCr of 0.64 mg/dL).  Sepsis Labs: @LABRCNTIP (procalcitonin:4,lacticidven:4) )No results found for this or any previous visit (from the past 240 hour(s)).    Radiological Exams on Admission: Ct Thoracic Spine Wo Contrast  Result Date: 03/30/2017 CLINICAL DATA:  Worsening back pain for several weeks. EXAM: CT THORACIC AND LUMBAR SPINE WITHOUT CONTRAST TECHNIQUE: Multidetector CT imaging of the thoracic and lumbar spine was performed without contrast. Multiplanar CT image reconstructions were also generated. COMPARISON:  None. FINDINGS: CT THORACIC SPINE FINDINGS Alignment: Marked kyphosis of the mid thoracic level with convex rightward thoracic scoliosis. Bones are diffusely demineralized. Vertebrae: Compression deformity is noted at T7 and T8 resulting in about 25-50% loss of height anteriorly and 25% loss of height posteriorly age level. Fractures are likely nonacute based on CT imaging features. There may be mild compression deformity of T10. Paraspinal and other soft tissues: No evidence for paraspinal hemorrhage. No evidence for marked bony central canal stenosis. On lung windows show interlobular septal thickening in the upper lobes with bilateral dependent lower lobe collapse/consolidation. Patchy airspace disease noted in both posterior lower lobes. The heart is enlarged. Coronary artery calcification is noted. Atherosclerotic calcification is noted in the wall of the thoracic aorta. Air-fluid level in the esophagus may be related to dysmotility or reflux. Disc levels: Mild loss of disc height is seen at T5-6, T6-7, and T7-T8 as well is T8-9. CT LUMBAR SPINE FINDINGS Segmentation: Normal. Alignment: Convex leftward scoliosis centered at L3-4. Preservation of normal lumbar lordosis. No subluxation. Vertebrae: Status post vertebral augmentation at L1. Remaining vertebral bodies show normal height. Marked osteopenia. Paraspinal and other soft tissues: 3.1 cm low-density lesion interpolar right kidney approaches water attenuation and is likely a cyst. Large fluid-filled structure identified in the upper central pelvis is presumably the bladder which is  distended. Cystic pelvic mass not excluded. Disc levels: Loss of intervertebral disc height with endplate degeneration is seen at L3-4. Remaining lumbar disc spaces are preserved. IMPRESSION: CT THORACIC SPINE IMPRESSION 1. Thoracolumbar scoliosis with marked kyphosis of the thoracic spine. Osteopenia. 2. Mild compression deformity at T7 and T8 without features on today's CT to suggest acute injury. If the patient is a candidate for vertebral augmentation, MRI or nuclear medicine bone scan may prove helpful to better assess acuity/chronicity of the changes at T7 and T8. 3. Bibasilar collapse/consolidation in the lower lobes. CT LUMBAR SPINE IMPRESSION 1. Status post vertebral augmentation at T12. 2. Loss of disc height with endplate degeneration at L3-4. 3. Marked osteopenia. 4.  Abdominal aortic atherosclerosis. Electronically Signed   By: Misty Stanley M.D.   On: 03/30/2017 16:41   Ct Lumbar Spine Wo Contrast  Result Date: 03/30/2017 CLINICAL DATA:  Worsening back pain for several weeks. EXAM: CT THORACIC AND LUMBAR SPINE WITHOUT CONTRAST TECHNIQUE: Multidetector CT imaging of the thoracic and lumbar spine was performed without contrast. Multiplanar CT image reconstructions were also generated. COMPARISON:  None. FINDINGS: CT THORACIC SPINE FINDINGS Alignment: Marked kyphosis of the mid thoracic level with convex rightward thoracic scoliosis. Bones are diffusely demineralized. Vertebrae: Compression deformity is noted at T7 and T8 resulting in about 25-50% loss of height anteriorly and 25% loss of height posteriorly age level. Fractures are likely nonacute based on CT imaging features. There may be mild compression deformity of T10. Paraspinal and other soft tissues: No evidence for paraspinal hemorrhage. No evidence for marked bony central canal stenosis. On lung windows show interlobular septal thickening in the upper lobes with bilateral dependent lower lobe collapse/consolidation. Patchy airspace disease  noted in both posterior lower lobes. The heart is enlarged. Coronary artery calcification is noted. Atherosclerotic calcification is noted in the wall of the thoracic aorta. Air-fluid level in the esophagus may be related to dysmotility or reflux. Disc levels: Mild loss of disc height is seen at T5-6, T6-7, and T7-T8 as well is T8-9. CT LUMBAR SPINE FINDINGS Segmentation: Normal. Alignment: Convex leftward scoliosis centered at L3-4. Preservation of normal lumbar lordosis. No subluxation. Vertebrae: Status post vertebral augmentation at L1. Remaining vertebral bodies show normal height. Marked osteopenia. Paraspinal and other soft tissues: 3.1 cm low-density lesion interpolar right kidney approaches water attenuation and is likely a cyst. Large fluid-filled structure identified in the upper central pelvis is presumably the bladder which is distended. Cystic pelvic mass not excluded. Disc levels: Loss of intervertebral disc height with endplate degeneration is seen at L3-4. Remaining lumbar disc spaces are preserved. IMPRESSION: CT THORACIC SPINE IMPRESSION 1. Thoracolumbar scoliosis with marked kyphosis of the thoracic spine. Osteopenia. 2. Mild compression deformity at T7 and T8 without features on today's CT to suggest acute injury. If the patient is a candidate for vertebral augmentation, MRI or nuclear medicine bone scan may prove helpful to better assess acuity/chronicity of the changes at T7 and T8. 3. Bibasilar collapse/consolidation in the lower lobes. CT LUMBAR SPINE IMPRESSION 1. Status post vertebral augmentation at T12. 2. Loss of disc height with endplate degeneration at L3-4. 3. Marked osteopenia. 4. Abdominal aortic atherosclerosis. Electronically Signed   By: Misty Stanley M.D.   On: 03/30/2017 16:41    EKG: not done  Assessment/Plan Principal Problem:   Back pain Active Problems:   Hypertension   Hypothyroidism   Dementia   Back pain -Acute onset of debilitating back pain in a  previously independent woman -PE unremarkable -WBC 11.4 -UA negative-CT negative -No radicular symptoms other than also c/o left thigh pain (normal PE) -Will obtain xrays of left knee and hip  -MRI of thoracic spine tomorrow -Pain control with morphine and IV Robaxin -PT/OT consults -If current therapy is ineffective, steroid burst may be reasonable to try -If she does not become able to ambulate, placement for rehab may be needed  HTN -Poorly controlled in ER, likely due to pain -Continue Lisinopril -prn IV hydralazine  Hypothyroidism -Check TSH -Continue Synthroid at current dose for now  Dementia -She is quite pleasant at this time and the most obvious manifestation of her disease is that she repeats herself -Continue home Seroquel (fairly high-dosed) -She is at  risk for sundowning and worsening confusion while hospitalized  DVT prophylaxis: Lovenox Code Status: DNR - confirmed with patient/family Family Communication: Son present throughout  Disposition Plan: To be determined Consults called: None Admission status: It is my clinical opinion that referral for OBSERVATION is reasonable and necessary in this patient based on the above information provided. The aforementioned taken together are felt to place the patient at high risk for further clinical deterioration. However it is anticipated that the patient may be medically stable for discharge from the hospital within 24 to 48 hours.    Karmen Bongo MD [facility]  If 7PM-7AM, please contact night-coverage [email or web address] Password [facility]  03/30/2017, 7:46 PM

## 2017-03-30 NOTE — ED Notes (Signed)
Admitting physician at bedside

## 2017-03-30 NOTE — Progress Notes (Signed)
When administering po medication pt had a hard time swallowing and vomited up one of her medications. Pt states she's been having trouble with swallowing for a long time and believes she is going to die, will inform RN Elmyra Ricks and continue to monitor.

## 2017-03-30 NOTE — ED Provider Notes (Signed)
Pt received at sign out. Pt c/o TS and LS pain, no known injury. CT's without acute findings. Multiple doses of IV pain meds given. Pt continues unable to ambulate due to pain.  T/C to [facility]. Lorin Mercy, case discussed, including:  HPI, pertinent PM/SHx, VS/PE, dx testing, ED course and treatment:  Agreeable to come to ED for evaluation for admission.    Francine Graven, DO 03/30/17 1909

## 2017-03-30 NOTE — ED Notes (Signed)
Patient to radiology for additional xrays

## 2017-03-31 ENCOUNTER — Observation Stay (HOSPITAL_COMMUNITY): Payer: Medicare Other

## 2017-03-31 DIAGNOSIS — G309 Alzheimer's disease, unspecified: Secondary | ICD-10-CM | POA: Diagnosis not present

## 2017-03-31 DIAGNOSIS — I10 Essential (primary) hypertension: Secondary | ICD-10-CM | POA: Diagnosis not present

## 2017-03-31 DIAGNOSIS — S22060A Wedge compression fracture of T7-T8 vertebra, initial encounter for closed fracture: Secondary | ICD-10-CM | POA: Diagnosis not present

## 2017-03-31 DIAGNOSIS — M546 Pain in thoracic spine: Secondary | ICD-10-CM | POA: Diagnosis not present

## 2017-03-31 DIAGNOSIS — F0280 Dementia in other diseases classified elsewhere without behavioral disturbance: Secondary | ICD-10-CM | POA: Diagnosis not present

## 2017-03-31 DIAGNOSIS — E039 Hypothyroidism, unspecified: Secondary | ICD-10-CM | POA: Diagnosis not present

## 2017-03-31 LAB — BASIC METABOLIC PANEL
ANION GAP: 12 (ref 5–15)
BUN: 15 mg/dL (ref 6–20)
CHLORIDE: 99 mmol/L — AB (ref 101–111)
CO2: 22 mmol/L (ref 22–32)
Calcium: 8.9 mg/dL (ref 8.9–10.3)
Creatinine, Ser: 0.66 mg/dL (ref 0.44–1.00)
GFR calc Af Amer: >60 mL/min (ref >60)
GFR calc non Af Amer: >60 mL/min (ref >60)
GLUCOSE: 111 mg/dL — AB (ref 65–99)
POTASSIUM: 3.2 mmol/L — AB (ref 3.5–5.1)
Sodium: 133 mmol/L — ABNORMAL LOW (ref 135–145)

## 2017-03-31 LAB — CBC
HEMATOCRIT: 38.7 % (ref 36.0–46.0)
HEMOGLOBIN: 13.6 g/dL (ref 12.0–15.0)
MCH: 31.5 pg (ref 26.0–34.0)
MCHC: 35.1 g/dL (ref 30.0–36.0)
MCV: 89.6 fL (ref 78.0–100.0)
Platelets: 214 10*3/uL (ref 150–400)
RBC: 4.32 MIL/uL (ref 3.87–5.11)
RDW: 12.7 % (ref 11.5–15.5)
WBC: 11.8 10*3/uL — ABNORMAL HIGH (ref 4.0–10.5)

## 2017-03-31 MED ORDER — POTASSIUM CHLORIDE 20 MEQ PO PACK: 40.0000 meq | PACK | Freq: Once | ORAL | Status: DC

## 2017-03-31 MED ORDER — GADOBENATE DIMEGLUMINE 529 MG/ML IV SOLN
8.0000 mL | Freq: Once | INTRAVENOUS | Status: AC | PRN
  Administered 2017-03-31: 8 mL via INTRAVENOUS

## 2017-03-31 MED ORDER — METHOCARBAMOL 500 MG PO TABS: 500.0000 mg | ORAL_TABLET | Freq: Four times a day (QID) | ORAL | 0 refills | Status: DC | PRN

## 2017-03-31 MED ORDER — ENOXAPARIN SODIUM 30 MG/0.3ML ~~LOC~~ SOLN: 30.0000 mg | SUBCUTANEOUS | Status: DC

## 2017-03-31 MED ORDER — ACETAMINOPHEN-CODEINE #3 300-30 MG PO TABS: 1.0000 | ORAL_TABLET | Freq: Four times a day (QID) | ORAL | 0 refills | Status: DC | PRN

## 2017-03-31 NOTE — Progress Notes (Signed)
Patient discharged home. With IV removed and site intact. Patient discharged home with [facility] and prescriptions.

## 2017-03-31 NOTE — Discharge Summary (Signed)
Physician Discharge Summary  Felicia Harvey ZYS:[id number] DOB: 11-Feb-1929 DOA: 03/30/2017  PCP: Wende Neighbors, MD  Admit date: 03/30/2017 Discharge date: 03/31/2017  Admitted From: home Disposition:  home  Recommendations for Outpatient Follow-up:  1. Follow up with PCP in 1-2 weeks  Home Health: HHPT Equipment/Devices:  Discharge Condition: stable CODE STATUS: DNR Diet recommendation: Heart Healthy   Brief/Interim Summary: 83 y/o female who presented to the ED with severe back pain. She was unable to move or walk since onset of pain. She denied any trauma or falls. She was evaluated in the ED where imaging was unremarkable. She was admitted to the hospital for further management of pain and MRI imaging. The following day she underwent MRI of T spine, that did not show any acute fractures or other acute findings. She received pain medications and muscle relaxants with improvement of her pain. She is able to walk with physical therapy and is ambulating in the hall with a walker. Recommendations are for home health physical therapy. Patient appears to be back to her baseline. She'll be discharged home with home health. Remainder of her medical problems remain stable.  Discharge Diagnoses:  Principal Problem:   Back pain Active Problems:   Hypertension   Hypothyroidism   Dementia    Discharge Instructions  Discharge Instructions    Diet - low sodium heart healthy    Complete by:  As directed    Increase activity slowly    Complete by:  As directed      Allergies as of 03/31/2017      Reactions   Gluten    Celiac disease      Medication List    TAKE these medications   acetaminophen 325 MG tablet Commonly known as:  TYLENOL Take 650 mg by mouth every 6 (six) hours as needed.   acetaminophen-codeine 300-30 MG tablet Commonly known as:  TYLENOL #3 Take 1 tablet by mouth every 6 (six) hours as needed for moderate pain.   calcium carbonate 1250 (500 Ca) MG tablet Commonly  known as:  OS-CAL - dosed in mg of elemental calcium Take 1 tablet by mouth every morning.   diclofenac 50 MG EC tablet Commonly known as:  VOLTAREN Take 50 mg by mouth daily.   FUSION PLUS PO Take 1 capsule by mouth daily.   levothyroxine 75 MCG tablet Commonly known as:  SYNTHROID, LEVOTHROID Take 75 mcg by mouth every morning.   lidocaine 2 % solution Commonly known as:  XYLOCAINE Use as directed 10 mLs in the mouth or throat 4 (four) times daily -  before meals and at bedtime.   lisinopril 40 MG tablet Commonly known as:  PRINIVIL,ZESTRIL Take 40 mg by mouth every morning.   methocarbamol 500 MG tablet Commonly known as:  ROBAXIN Take 1 tablet (500 mg total) by mouth every 6 (six) hours as needed for muscle spasms.   omeprazole 20 MG capsule Commonly known as:  PRILOSEC TAKE ONE CAPSULE BY MOUTH TWICE DAILY.   polyethylene glycol packet Commonly known as:  MIRALAX / GLYCOLAX Take 17 g by mouth daily.   QUEtiapine 200 MG tablet Commonly known as:  SEROQUEL Take 200 mg by mouth at bedtime.       Allergies  Allergen Reactions  . Gluten     Celiac disease    Consultations:     Procedures/Studies: Ct Thoracic Spine Wo Contrast  Result Date: 03/30/2017 CLINICAL DATA:  Worsening back pain for several weeks. EXAM: CT THORACIC AND LUMBAR  SPINE WITHOUT CONTRAST TECHNIQUE: Multidetector CT imaging of the thoracic and lumbar spine was performed without contrast. Multiplanar CT image reconstructions were also generated. COMPARISON:  None. FINDINGS: CT THORACIC SPINE FINDINGS Alignment: Marked kyphosis of the mid thoracic level with convex rightward thoracic scoliosis. Bones are diffusely demineralized. Vertebrae: Compression deformity is noted at T7 and T8 resulting in about 25-50% loss of height anteriorly and 25% loss of height posteriorly age level. Fractures are likely nonacute based on CT imaging features. There may be mild compression deformity of T10. Paraspinal  and other soft tissues: No evidence for paraspinal hemorrhage. No evidence for marked bony central canal stenosis. On lung windows show interlobular septal thickening in the upper lobes with bilateral dependent lower lobe collapse/consolidation. Patchy airspace disease noted in both posterior lower lobes. The heart is enlarged. Coronary artery calcification is noted. Atherosclerotic calcification is noted in the wall of the thoracic aorta. Air-fluid level in the esophagus may be related to dysmotility or reflux. Disc levels: Mild loss of disc height is seen at T5-6, T6-7, and T7-T8 as well is T8-9. CT LUMBAR SPINE FINDINGS Segmentation: Normal. Alignment: Convex leftward scoliosis centered at L3-4. Preservation of normal lumbar lordosis. No subluxation. Vertebrae: Status post vertebral augmentation at L1. Remaining vertebral bodies show normal height. Marked osteopenia. Paraspinal and other soft tissues: 3.1 cm low-density lesion interpolar right kidney approaches water attenuation and is likely a cyst. Large fluid-filled structure identified in the upper central pelvis is presumably the bladder which is distended. Cystic pelvic mass not excluded. Disc levels: Loss of intervertebral disc height with endplate degeneration is seen at L3-4. Remaining lumbar disc spaces are preserved. IMPRESSION: CT THORACIC SPINE IMPRESSION 1. Thoracolumbar scoliosis with marked kyphosis of the thoracic spine. Osteopenia. 2. Mild compression deformity at T7 and T8 without features on today's CT to suggest acute injury. If the patient is a candidate for vertebral augmentation, MRI or nuclear medicine bone scan may prove helpful to better assess acuity/chronicity of the changes at T7 and T8. 3. Bibasilar collapse/consolidation in the lower lobes. CT LUMBAR SPINE IMPRESSION 1. Status post vertebral augmentation at T12. 2. Loss of disc height with endplate degeneration at L3-4. 3. Marked osteopenia. 4. Abdominal aortic atherosclerosis.  Electronically Signed   By: Misty Stanley M.D.   On: 03/30/2017 16:41   Ct Lumbar Spine Wo Contrast  Result Date: 03/30/2017 CLINICAL DATA:  Worsening back pain for several weeks. EXAM: CT THORACIC AND LUMBAR SPINE WITHOUT CONTRAST TECHNIQUE: Multidetector CT imaging of the thoracic and lumbar spine was performed without contrast. Multiplanar CT image reconstructions were also generated. COMPARISON:  None. FINDINGS: CT THORACIC SPINE FINDINGS Alignment: Marked kyphosis of the mid thoracic level with convex rightward thoracic scoliosis. Bones are diffusely demineralized. Vertebrae: Compression deformity is noted at T7 and T8 resulting in about 25-50% loss of height anteriorly and 25% loss of height posteriorly age level. Fractures are likely nonacute based on CT imaging features. There may be mild compression deformity of T10. Paraspinal and other soft tissues: No evidence for paraspinal hemorrhage. No evidence for marked bony central canal stenosis. On lung windows show interlobular septal thickening in the upper lobes with bilateral dependent lower lobe collapse/consolidation. Patchy airspace disease noted in both posterior lower lobes. The heart is enlarged. Coronary artery calcification is noted. Atherosclerotic calcification is noted in the wall of the thoracic aorta. Air-fluid level in the esophagus may be related to dysmotility or reflux. Disc levels: Mild loss of disc height is seen at T5-6, T6-7, and T7-T8  as well is T8-9. CT LUMBAR SPINE FINDINGS Segmentation: Normal. Alignment: Convex leftward scoliosis centered at L3-4. Preservation of normal lumbar lordosis. No subluxation. Vertebrae: Status post vertebral augmentation at L1. Remaining vertebral bodies show normal height. Marked osteopenia. Paraspinal and other soft tissues: 3.1 cm low-density lesion interpolar right kidney approaches water attenuation and is likely a cyst. Large fluid-filled structure identified in the upper central pelvis is  presumably the bladder which is distended. Cystic pelvic mass not excluded. Disc levels: Loss of intervertebral disc height with endplate degeneration is seen at L3-4. Remaining lumbar disc spaces are preserved. IMPRESSION: CT THORACIC SPINE IMPRESSION 1. Thoracolumbar scoliosis with marked kyphosis of the thoracic spine. Osteopenia. 2. Mild compression deformity at T7 and T8 without features on today's CT to suggest acute injury. If the patient is a candidate for vertebral augmentation, MRI or nuclear medicine bone scan may prove helpful to better assess acuity/chronicity of the changes at T7 and T8. 3. Bibasilar collapse/consolidation in the lower lobes. CT LUMBAR SPINE IMPRESSION 1. Status post vertebral augmentation at T12. 2. Loss of disc height with endplate degeneration at L3-4. 3. Marked osteopenia. 4. Abdominal aortic atherosclerosis. Electronically Signed   By: Misty Stanley M.D.   On: 03/30/2017 16:41   Mr Thoracic Spine W Wo Contrast  Result Date: 03/31/2017 CLINICAL DATA:  83 year old female with increasing back pain for several weeks. Mild T7 and T8 compression fractures not felt to be acute on recent CT. EXAM: MRI THORACIC WITHOUT AND WITH CONTRAST TECHNIQUE: Multiplanar and multiecho pulse sequences of the thoracic spine were obtained without and with intravenous contrast. CONTRAST:  59mL MULTIHANCE GADOBENATE DIMEGLUMINE 529 MG/ML IV SOLN COMPARISON:  Thoracic spine CT 03/30/2017. CT Abdomen and Pelvis 08/25/2013 FINDINGS: MRI THORACIC SPINE FINDINGS Limited sagittal imaging the cervical spine: Negative aside from exaggerated cervical lordosis. Thoracic spine segmentation:  Normal. Alignment: Exaggerated thoracic kyphosis. No significant spondylolisthesis. Vertebrae: Chronic L1 compression fracture previously augmented. Multilevel mild chronic thoracic superior endplate compression fractures including T2 through T4. Borderline to mild chronic compression of T6 and T7. Late subacute to early  chronic appearing T8 compression fracture with only in minimal residual marrow edema along what appears to be the fracture lucency (series 12, image 5). No abnormal enhancement of the vertebral body. Mild retropulsion of the posterior inferior T8 endplate, but no significant spinal stenosis results. Mild chronic inferior endplate compression fractures T10 through T12. No visible upper lumbar marrow edema. No acute osseous abnormality identified. Cord: Spinal cord signal is within normal limits at all visualized levels. Conus medullaris is within normal limits at T12. No abnormal intradural enhancement. Paraspinal and other soft tissues: Small layering bilateral pleural effusions (series 15, image 12). Dependent lung atelectasis. Otherwise negative visualized thoracic and upper abdominal viscera. Posterior paraspinal soft tissues are within normal limits. Disc levels: No thoracic spinal stenosis. Age concordant thoracic spine degeneration. IMPRESSION: 1. No acute osseous abnormality is identified. Late-subacute to early-chronic appearing T8 compression fracture. Chronic thoracic and lumbar compression fractures elsewhere, including previously augmented L1 fracture. 2. No thoracic spinal stenosis. Age concordant thoracic spine degeneration. 3. Small layering bilateral pleural effusions. Electronically Signed   By: Genevie Ann M.D.   On: 03/31/2017 09:59   Dg Knee Complete 4 Views Left  Result Date: 03/30/2017 CLINICAL DATA:  Pain. EXAM: LEFT KNEE - COMPLETE 4+ VIEW COMPARISON:  05/20/2012 FINDINGS: Bones are demineralized. No fracture. No subluxation or dislocation. No joint effusion. IMPRESSION: Negative. Electronically Signed   By: Misty Stanley M.D.   On:  03/30/2017 20:59   Dg Hip Unilat With Pelvis 1v Left  Result Date: 03/30/2017 CLINICAL DATA:  Left hip pain EXAM: DG HIP (WITH OR WITHOUT PELVIS) 1V*L* COMPARISON:  CT from 08/25/2013 FINDINGS: There is a new left bipolar hip arthroplasty with cemented femoral  stem. No acute fracture or dislocation is noted. No hardware failure. Chronic fracture deformity the right iliac bone. Lower lumbar degenerative disc disease L4-5 and L5-S1. Joint space narrowing of the native right hip. Numerous gas-filled bowel loops are noted overlying iliac bones and mid-pelvis limiting assessment. IMPRESSION: No acute osseous abnormality of the left hip. A left bipolar hip arthroplasty is noted without apparent hardware failure. Electronically Signed   By: Ashley Royalty M.D.   On: 03/30/2017 21:07       Subjective: Back pain is better today. Moving around with physical therapy  Discharge Exam: Vitals:   03/31/17 0555 03/31/17 1300  BP: 126/68 (!) 141/81  Pulse: (!) 101 90  Resp: 16 18  Temp: 97.7 F (36.5 C) 97.7 F (36.5 C)   Vitals:   03/30/17 2223 03/31/17 0207 03/31/17 0555 03/31/17 1300  BP: (!) 181/79 134/75 126/68 (!) 141/81  Pulse: 80 88 (!) 101 90  Resp: 16 16 16 18   Temp: 99.4 F (37.4 C) 98.7 F (37.1 C) 97.7 F (36.5 C) 97.7 F (36.5 C)  TempSrc: Oral Oral Oral Oral  SpO2: 97% 98% 97% 97%  Weight: 41.2 kg (90 lb 13.3 oz)     Height:        General: Pt is alert, awake, not in acute distress Cardiovascular: RRR, S1/S2 +, no rubs, no gallops Respiratory: CTA bilaterally, no wheezing, no rhonchi Abdominal: Soft, NT, ND, bowel sounds + Extremities: no edema, no cyanosis    The results of significant diagnostics from this hospitalization (including imaging, microbiology, ancillary and laboratory) are listed below for reference.     Microbiology: No results found for this or any previous visit (from the past 240 hour(s)).   Labs: BNP (last 3 results) No results for input(s): BNP in the last 8760 hours. Basic Metabolic Panel:  Recent Labs Lab 03/30/17 1348 03/31/17 0525  NA 132* 133*  K 3.4* 3.2*  CL 99* 99*  CO2 24 22  GLUCOSE 106* 111*  BUN 12 15  CREATININE 0.64 0.66  CALCIUM 8.8* 8.9   Liver Function Tests: No results for  input(s): AST, ALT, ALKPHOS, BILITOT, PROT, ALBUMIN in the last 168 hours. No results for input(s): LIPASE, AMYLASE in the last 168 hours. No results for input(s): AMMONIA in the last 168 hours. CBC:  Recent Labs Lab 03/30/17 1348 03/31/17 0525  WBC 11.4* 11.8*  NEUTROABS 10.3*  --   HGB 14.0 13.6  HCT 40.1 38.7  MCV 90.3 89.6  PLT 190 214   Cardiac Enzymes: No results for input(s): CKTOTAL, CKMB, CKMBINDEX, TROPONINI in the last 168 hours. BNP: Invalid input(s): POCBNP CBG: No results for input(s): GLUCAP in the last 168 hours. D-Dimer No results for input(s): DDIMER in the last 72 hours. Hgb A1c No results for input(s): HGBA1C in the last 72 hours. Lipid Profile No results for input(s): CHOL, HDL, LDLCALC, TRIG, CHOLHDL, LDLDIRECT in the last 72 hours. Thyroid function studies  Recent Labs  03/30/17 1348  TSH 1.008   Anemia work up No results for input(s): VITAMINB12, FOLATE, FERRITIN, TIBC, IRON, RETICCTPCT in the last 72 hours. Urinalysis    Component Value Date/Time   COLORURINE YELLOW 03/30/2017 [place] 03/30/2017 1347  LABSPEC 1.005 03/30/2017 1347   PHURINE 8.0 03/30/2017 1347   GLUCOSEU NEGATIVE 03/30/2017 1347   HGBUR NEGATIVE 03/30/2017 1347   BILIRUBINUR NEGATIVE 03/30/2017 1347   KETONESUR NEGATIVE 03/30/2017 1347   PROTEINUR NEGATIVE 03/30/2017 1347   UROBILINOGEN 1.0 11/01/2013 1445   NITRITE NEGATIVE 03/30/2017 1347   LEUKOCYTESUR SMALL (A) 03/30/2017 1347   Sepsis Labs Invalid input(s): PROCALCITONIN,  WBC,  LACTICIDVEN Microbiology No results found for this or any previous visit (from the past 240 hour(s)).   Time coordinating discharge: Over 30 minutes  SIGNED:   Kathie Dike, MD  [facility] 03/31/2017, 2:28 PM Pager   If 7PM-7AM, please contact night-coverage [email or web address] Password [email or web address]

## 2017-03-31 NOTE — Care Management Note (Addendum)
Case Management Note  Patient Details  Name: Felicia Harvey MRN: [id number] Date of Birth: 05-16-1929  Subjective/Objective:  Adm with back pain/unable to walk. From home with son. No DME PTA. Has PCP, son drives her to appointments. Reports no issues affording medications. PT consult pending.                   Action/Plan: Anticipate DC home with self care. Will follow.   ADDENDUM: Patient recommended for OP PT. Son would like [facility] PT with [facility]. Romualdo Bolk of [facility] notified and will obtain orders from chart. Son aware that [facility] has 48 hours to initiate services.    Expected Discharge Date:       04/01/2017           Expected Discharge Plan:  [place]  In-House Referral:     Discharge planning Services  CM Consult  Post Acute Care Choice:    Choice offered to:     DME Arranged:    DME Agency:     HH Arranged:    [place] Agency:     Status of Service:  In process, will continue to follow  If discussed at Long Length of Stay Meetings, dates discussed:    Additional Comments:  Hill Mackie, Chauncey [place], RN 03/31/2017, 1:46 PM

## 2017-03-31 NOTE — Evaluation (Signed)
Physical Therapy Evaluation Patient Details Name: Felicia Harvey MRN: [id number] DOB: [birth year] Today's Date: 03/31/2017   History of Present Illness  83 y.o. female with medical history significant of hypothyroidism; osteopenia; dementia; HTN; and GERD presenting with acute onset of back pain.  The history is limited by the patient's dementia and her son's attempts to complete her sentences for her.  She was at home cooking breakfast this AM.  She developed stabbing pain in the mid back.  Unable to move or walk since.  Continuous, severe, disabling pain since it started.  No h/o similar.  Thinks she may have moved funny.  No incontinence - urinary or fecal.  Also with pain in left knee/thigh which is new.  She does have a reported h/o kyphoplasty.  MRI Thoracic spine shows: Late-subacute to early-chronic appearing T8 compression fracture. Chronic thoracic and lumbar compression fractures elsewhere, including previously augmented L1 fracture.  CT lumbar spine shows: Thoracolumbar scoliosis with marked kyphosis of the thoracic spine. Osteopenia.  2. Mild compression deformity at T7 and T8 without features on today's CT to suggest acute injury.    Clinical Impression  Pt received in bed, son and dtr present, and pt is agreeable to PT evaluation.  Pt is pleasantly demented, but son is able to give accurate accounts of her PLOF.  Pt is normally a limited community ambulator, but does not use any assistive device.  She is independent with dressing and bathing.  During PT evaluation, the pt and son were both educated on using the log roll technique for supine<>sit.  She was able to ambulate 138ft with RW and min guard/supervision.  She was educated on transfer stand<>sit and to lower herself slowly instead of plopping down harshly.  She is recommended to f/u with OPPT and use her RW for ambulation at this time.     Follow Up Recommendations Supervision/Assistance - 24 hour;Outpatient PT    Equipment  Recommendations  None recommended by PT    Recommendations for Other Services       Precautions / Restrictions Precautions Precaution Comments: No falls in the past 6 months.  Restrictions Weight Bearing Restrictions: No Other Position/Activity Restrictions: LOGROLL for getting in/out of bed to reduce pain with compression fx's.       Mobility  Bed Mobility Overal bed mobility: Needs Assistance Bed Mobility: Sidelying to Sit;Rolling Rolling: Min guard Sidelying to sit: Min assist       General bed mobility comments: Bed flat, log roll technique utilized due to compression fx's.   Transfers Overall transfer level: Needs assistance Equipment used: Rolling walker (2 wheeled) Transfers: Sit to/from Stand Sit to Stand: Min guard            Ambulation/Gait Ambulation/Gait assistance: Min guard;Supervision Ambulation Distance (Feet): 100 Feet Assistive device: Rolling walker (2 wheeled) Gait Pattern/deviations: Step-through pattern        Stairs            Wheelchair Mobility    Modified Rankin (Stroke Patients Only)       Balance Overall balance assessment: Needs assistance Sitting-balance support: Bilateral upper extremity supported;Feet supported Sitting balance-Leahy Scale: Good     Standing balance support: Bilateral upper extremity supported Standing balance-Leahy Scale: Fair                               Pertinent Vitals/Pain Pain Assessment: No/denies pain    Home Living   Living Arrangements: Children (Son)  Available Help at Discharge: Available 24 hours/day Type of Home: House Home Access: Stairs to enter   [employer] of Steps: 5-6 steps. Home Layout: One level Home Equipment: Toilet riser;Hand held Tourist information centre manager - 2 wheels;Cane - single point      Prior Function Level of Independence: Independent   Gait / Transfers Assistance Needed: independent.  Pt states she likes to get out into the yard and  walk around.    ADL's / Homemaking Assistance Needed: independent.   Comments: Pt's dtr states that she walks for a long time every day (>30 min)     Hand Dominance   Dominant Hand: Right    Extremity/Trunk Assessment   Upper Extremity Assessment Upper Extremity Assessment: Overall WFL for tasks assessed;Defer to OT evaluation    Lower Extremity Assessment Lower Extremity Assessment: Generalized weakness       Communication   Communication: No difficulties  Cognition Arousal/Alertness: Awake/alert Behavior During Therapy: WFL for tasks assessed/performed Overall Cognitive Status: History of cognitive impairments - at baseline                                 General Comments: Pt has a history of dementia.  She states that she is in some sort of nuring unit, but is unable to express why she is here, or the date.       General Comments      Exercises     Assessment/Plan    PT Assessment Patient needs continued PT services  PT Problem List Decreased mobility;Decreased safety awareness;Decreased knowledge of use of DME;Decreased activity tolerance;Pain       PT Treatment Interventions DME instruction;Gait training;Functional mobility training;Therapeutic activities;Stair training;Therapeutic exercise;Balance training;Patient/family education    PT Goals (Current goals can be found in the Care Plan section)  Acute Rehab PT Goals Patient Stated Goal: To go home PT Goal Formulation: With patient/family Time For Goal Achievement: 04/07/17 Potential to Achieve Goals: Good    Frequency Min 3X/week   Barriers to discharge        Co-evaluation               End of Session Equipment Utilized During Treatment: Gait belt Activity Tolerance: Patient tolerated treatment well Patient left: in chair;with call bell/phone within reach;with family/visitor present Nurse Communication: Mobility status (Lauren, RN notified of pt's mobility status, and  mobility sheet left hanging in the room. ) PT Visit Diagnosis: Muscle weakness (generalized) (M62.81);Pain Pain - Right/Left:  (bilaterally) Pain - part of body:  (back)    Time: 7517-0017 PT Time Calculation (min) (ACUTE ONLY): 35 min   Charges:   PT Evaluation $PT Eval Low Complexity: 1 Procedure PT Treatments $Gait Training: 8-22 mins   PT G Codes:   PT G-Codes **NOT FOR INPATIENT CLASS** Functional Assessment Tool Used: AM-PAC 6 Clicks Basic Mobility;Clinical judgement Functional Limitation: Mobility: Walking and moving around Mobility: Walking and Moving Around Current Status (C9449): At least 40 percent but less than 60 percent impaired, limited or restricted Mobility: Walking and Moving Around Goal Status [phone]): At least 20 percent but less than 40 percent impaired, limited or restricted    Beth Ricahrd Schwager, PT, DPT X: [phone]

## 2017-03-31 NOTE — Evaluation (Signed)
Clinical/Bedside Swallow Evaluation Patient Details  Name: Felicia Harvey MRN: [id number] Date of Birth: 01/22/1929  Today's Date: 03/31/2017 Time: SLP Start Time (ACUTE ONLY): 1452 SLP Stop Time (ACUTE ONLY): 1512 SLP Time Calculation (min) (ACUTE ONLY): 20 min  Past Medical History:  Past Medical History:  Diagnosis Date  . Anxiety   . Celiac disease   . Chest pain 09/21/2011   OCCASIONAL  . Constipation   . Dementia   . Difficulty sleeping    TAKES MED TO SLEEP  . Dysphagia 09/21/2011  . Dysrhythmia    "heart arrthymia" per son, but unsure what kind  . GERD (gastroesophageal reflux disease)   . HTN (hypertension)   . Hypothyroidism   . Knee fracture, left 03/2012  . Osteopenia   . Ovarian mass   . 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 Schizophrenia ([place])    FAMILY UNSURE OF THIS   Past Surgical History:  Past Surgical History:  Procedure Laterality Date  . ESOPHAGOGASTRODUODENOSCOPY  09/27/2011   Inflammatory changes midesophagus most likely pill-induced injury (Fosamax), large diaphragmatic hernia, biopsies from small bowel consistent with celiac disease  . ESOPHAGOGASTRODUODENOSCOPY (EGD) WITH ESOPHAGEAL DILATION N/A 07/13/2013   WUJ:WJXBJY esophagitis-more likely pill-induced s/p esophageal biopsy. Noncritical. Schatzki's ring s/p passage of a Maloney dilator. Hiatal hernia. Abnormal duodenum consistent with prior diagnosis of celiac disease. Status post 3 French Maloney dilator. Esophageal biopsies benign showed marked acute and chronic inflammation.  Marland Kitchen LAPAROSCOPIC SALPINGO OOPHERECTOMY Bilateral 11/16/2013   Procedure: LAPAROSCOPIC BILATERAL SALPINGO OOPHORECTOMY;  Surgeon: Imagene Gurney A. Alycia Rossetti, MD;  Location: [facility];  Service: Gynecology;  Laterality: Bilateral;  . left hip replacement    . left shoulder    . THYROIDECTOMY    . VERTEBROPLASTY     HPI:  83 y.o. female with medical history significant of hypothyroidism; osteopenia; dementia; HTN; and GERD presenting with  acute onset of back pain.  The history is limited by the patient's dementia and her son's attempts to complete her sentences for her.  She was at home cooking breakfast this AM.  She developed stabbing pain in the mid back.  Unable to move or walk since.  Continuous, severe, disabling pain since it started.  No h/o similar.  Thinks she may have moved funny.  No incontinence - urinary or fecal.  Also with pain in left knee/thigh which    Assessment / Plan / Recommendation Clinical Impression  Pt exhibited no overt s/s of aspiration during BSE; normal oropharyngeal swallow noted; no f/u indicated; impaired mastication secondary to missing dentition; family has modified diet at home to a soft diet to accommodate pt's limited dentition. Recommend continuing a Dysphagia 3 (mechanical/soft) diet at home with thin liquids as tolerated. SLP Visit Diagnosis: Dysphagia, unspecified (R13.10)    Aspiration Risk  No limitations    Diet Recommendation   Dysphagia 3 (mechanical/soft)/thin per family as pt had a similar diet at home prior to admission  Medication Administration: Whole meds with puree    Other  Recommendations Oral Care Recommendations: Oral care BID   Follow up Recommendations None      Frequency and Duration     n/a       Prognosis Prognosis for Safe Diet Advancement: Good      Swallow Study   General Date of Onset: 03/30/17 HPI: 83 y.o. female with medical history significant of hypothyroidism; osteopenia; dementia; HTN; and GERD presenting with acute onset of back pain.  The history is limited by the patient's dementia and her son's  attempts to complete her sentences for her.  She was at home cooking breakfast this AM.  She developed stabbing pain in the mid back.  Unable to move or walk since.  Continuous, severe, disabling pain since it started. BSE ordered d/t pt's NPO status. Type of Study: Bedside Swallow Evaluation Diet Prior to this Study: NPO Temperature Spikes Noted:  No Respiratory Status: Room air History of Recent Intubation: No Behavior/Cognition: Alert;Cooperative Oral Cavity Assessment: Within Functional Limits Oral Care Completed by SLP: Recent completion by staff Oral Cavity - Dentition: Missing dentition Vision: Functional for self-feeding Self-Feeding Abilities: Able to feed self;Needs assist Patient Positioning: Upright in chair Baseline Vocal Quality: Low vocal intensity Volitional Cough: Weak Volitional Swallow: Able to elicit    Oral/Motor/Sensory Function Overall Oral Motor/Sensory Function: Within functional limits   Ice Chips Ice chips: Within functional limits Presentation: Spoon   Thin Liquid Thin Liquid: Within functional limits Presentation: Cup Other Comments:  (refused straw)    Nectar Thick Nectar Thick Liquid: Not tested   Honey Thick Honey Thick Liquid: Not tested   Puree Puree: Within functional limits Presentation: Self Fed   Solid      Solid: Impaired Presentation: Self Fed Oral Phase Impairments: Impaired mastication Oral Phase Functional Implications: Impaired mastication    Functional Assessment Tool Used: NOMS; clinical judgment Functional Limitations: Swallowing Swallow Current Status (X9024): At least 1 percent but less than 20 percent impaired, limited or restricted Swallow Goal Status [phone]): At least 1 percent but less than 20 percent impaired, limited or restricted Swallow Discharge Status [phone]): At least 1 percent but less than 20 percent impaired, limited or restricted   Elvina Sidle, M.S., CCC-SLP 03/31/2017,4:11 PM

## 2017-03-31 NOTE — Care Management Obs Status (Signed)
[facility] NOTIFICATION   Patient Details  Name: Felicia Harvey MRN: [id number] Date of Birth: 07-19-1929   Medicare Observation Status Notification Given:  Yes    Grasiela Jonsson, Chauncey Reading, RN 03/31/2017, 12:24 PM

## 2017-04-02 ENCOUNTER — Emergency Department (HOSPITAL_COMMUNITY): Payer: Medicare Other

## 2017-04-02 ENCOUNTER — Encounter (HOSPITAL_COMMUNITY): Payer: Self-pay | Admitting: Emergency Medicine

## 2017-04-02 ENCOUNTER — Emergency Department (HOSPITAL_COMMUNITY)
Admission: EM | Admit: 2017-04-02 | Discharge: 2017-04-02 | Disposition: A | Discharge status: Home / Self Care | Payer: Medicare Other | Attending: Emergency Medicine | Admitting: Emergency Medicine

## 2017-04-02 DIAGNOSIS — R935 Abnormal findings on diagnostic imaging of other abdominal regions, including retroperitoneum: Secondary | ICD-10-CM | POA: Insufficient documentation

## 2017-04-02 DIAGNOSIS — Z79899 Other long term (current) drug therapy: Secondary | ICD-10-CM | POA: Insufficient documentation

## 2017-04-02 DIAGNOSIS — R112 Nausea with vomiting, unspecified: Secondary | ICD-10-CM | POA: Diagnosis not present

## 2017-04-02 DIAGNOSIS — E039 Hypothyroidism, unspecified: Secondary | ICD-10-CM | POA: Diagnosis not present

## 2017-04-02 DIAGNOSIS — M545 Low back pain: Secondary | ICD-10-CM | POA: Diagnosis not present

## 2017-04-02 DIAGNOSIS — R531 Weakness: Secondary | ICD-10-CM | POA: Diagnosis not present

## 2017-04-02 DIAGNOSIS — R0682 Tachypnea, not elsewhere classified: Secondary | ICD-10-CM | POA: Insufficient documentation

## 2017-04-02 DIAGNOSIS — R42 Dizziness and giddiness: Secondary | ICD-10-CM | POA: Insufficient documentation

## 2017-04-02 DIAGNOSIS — R05 Cough: Secondary | ICD-10-CM | POA: Insufficient documentation

## 2017-04-02 DIAGNOSIS — M549 Dorsalgia, unspecified: Secondary | ICD-10-CM | POA: Insufficient documentation

## 2017-04-02 DIAGNOSIS — I10 Essential (primary) hypertension: Secondary | ICD-10-CM | POA: Insufficient documentation

## 2017-04-02 DIAGNOSIS — R5383 Other fatigue: Secondary | ICD-10-CM | POA: Diagnosis not present

## 2017-04-02 DIAGNOSIS — Z09 Encounter for follow-up examination after completed treatment for conditions other than malignant neoplasm: Secondary | ICD-10-CM | POA: Diagnosis not present

## 2017-04-02 LAB — DIFFERENTIAL
Basophils Absolute: 0 K/uL (ref 0.0–0.1)
Basophils Relative: 0 %
Eosinophils Absolute: 0 K/uL (ref 0.0–0.7)
Eosinophils Relative: 0 %
Lymphocytes Relative: 7 %
Lymphs Abs: 0.6 K/uL — ABNORMAL LOW (ref 0.7–4.0)
Monocytes Absolute: 0.6 K/uL (ref 0.1–1.0)
Monocytes Relative: 8 %
Neutro Abs: 6.6 K/uL (ref 1.7–7.7)
Neutrophils Relative %: 85 %

## 2017-04-02 LAB — COMPREHENSIVE METABOLIC PANEL
ALBUMIN: 3.7 g/dL (ref 3.5–5.0)
ALK PHOS: 59 U/L (ref 38–126)
ALT: 18 U/L (ref 14–54)
ANION GAP: 9 (ref 5–15)
AST: 35 U/L (ref 15–41)
BUN: 25 mg/dL — ABNORMAL HIGH (ref 6–20)
CALCIUM: 9.8 mg/dL (ref 8.9–10.3)
CHLORIDE: 92 mmol/L — AB (ref 101–111)
CO2: 27 mmol/L (ref 22–32)
CREATININE: 1.08 mg/dL — AB (ref 0.44–1.00)
GFR calc Af Amer: 53 mL/min — ABNORMAL LOW (ref >60)
GFR calc non Af Amer: 45 mL/min — ABNORMAL LOW (ref >60)
GLUCOSE: 119 mg/dL — AB (ref 65–99)
Potassium: 3.5 mmol/L (ref 3.5–5.1)
SODIUM: 128 mmol/L — AB (ref 135–145)
Total Bilirubin: 1 mg/dL (ref 0.3–1.2)
Total Protein: 6.7 g/dL (ref 6.5–8.1)

## 2017-04-02 LAB — CBC
HCT: 39.3 % (ref 36.0–46.0)
HEMOGLOBIN: 13.4 g/dL (ref 12.0–15.0)
MCH: 30.9 pg (ref 26.0–34.0)
MCHC: 34.1 g/dL (ref 30.0–36.0)
MCV: 90.8 fL (ref 78.0–100.0)
PLATELETS: 229 10*3/uL (ref 150–400)
RBC: 4.33 MIL/uL (ref 3.87–5.11)
RDW: 12.5 % (ref 11.5–15.5)
WBC: 7.7 10*3/uL (ref 4.0–10.5)

## 2017-04-02 LAB — TROPONIN I
Troponin I: <0.03 ng/mL (ref <0.03)
Troponin I: <0.03 ng/mL (ref <0.03)

## 2017-04-02 LAB — LACTIC ACID, PLASMA
LACTIC ACID, VENOUS: 2 mmol/L — AB (ref 0.5–1.9)
Lactic Acid, Venous: 1.1 mmol/L (ref 0.5–1.9)

## 2017-04-02 MED ORDER — SODIUM CHLORIDE 0.9 % IV BOLUS (SEPSIS)
500.0000 mL | Freq: Once | INTRAVENOUS | Status: AC
  Administered 2017-04-02: 500 mL via INTRAVENOUS

## 2017-04-02 MED ORDER — FENTANYL CITRATE (PF) 100 MCG/2ML IJ SOLN
50.0000 ug | Freq: Once | INTRAMUSCULAR | Status: AC
  Administered 2017-04-02: 50 ug via INTRAVENOUS
  Filled 2017-04-02: qty 2

## 2017-04-02 MED ORDER — LIDOCAINE 5 % EX PTCH: 1.0000 | MEDICATED_PATCH | CUTANEOUS | 0 refills | Status: DC

## 2017-04-02 MED ORDER — PREDNISONE 20 MG PO TABS: ORAL_TABLET | ORAL | 0 refills | Status: DC

## 2017-04-02 MED ORDER — IOPAMIDOL (ISOVUE-370) INJECTION 76%
80.0000 mL | Freq: Once | INTRAVENOUS | Status: AC | PRN
  Administered 2017-04-02: 80 mL via INTRAVENOUS

## 2017-04-02 MED ORDER — OXYCODONE-ACETAMINOPHEN 5-325 MG PO TABS: 0.5000 | ORAL_TABLET | Freq: Four times a day (QID) | ORAL | 0 refills | Status: DC | PRN

## 2017-04-02 NOTE — ED Notes (Signed)
Pt to CT at this time.

## 2017-04-02 NOTE — ED Provider Notes (Signed)
[facility] DEPT Provider Note   CSN: [id number] Arrival date & time: 04/02/17  1305  By signing my name below, I, Higinio Plan, attest that this documentation has been prepared under the direction and in the presence of Merrily Pew, MD . Electronically Signed: Higinio Plan, Scribe. 04/02/2017. 1:50 PM.  History   Chief Complaint Chief Complaint  Patient presents with  . Back Pain   The history is provided by the patient. No language interpreter was used.   HPI Comments: Felicia Harvey is a 83 y.o. female with PMHx of dementia, HTN, osteoporosis, and lumbar vertebral plasty who presents to the Emergency Department complaining of gradually worsening, lower back pain that began ~3 days ago. Per son, pt was admitted to [facility] on 03/30/17 and discharged on 03/31/17 for an exacerbation of her chronic midline thoracic back pain. He states pt was seen at her PCP office this morning and told she had questionable pneumonia and a "collapsed lung" and advised to visit the ED. Pt's son notes associated mild cough, fatigue, increased sleepiness, and intermittent weakness over the past few days. He reports pt said she "wanted to die and go to heaven" due to her pain this morning which is an abnormal statement for her. He states pt can ambulate independently at baseline and is active every day.   Past Medical History:  Diagnosis Date  . Anxiety   . Celiac disease   . Chest pain 09/21/2011   OCCASIONAL  . Constipation   . Dementia   . Difficulty sleeping    TAKES MED TO SLEEP  . Dysphagia 09/21/2011  . Dysrhythmia    "heart arrthymia" per son, but unsure what kind  . GERD (gastroesophageal reflux disease)   . HTN (hypertension)   . Hypothyroidism   . Knee fracture, left 03/2012  . Osteopenia   . Ovarian mass   . 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 Schizophrenia ([place])    FAMILY UNSURE OF THIS    Patient Active Problem List   Diagnosis Date Noted  . Back pain 03/30/2017  . Dementia  03/30/2017  . Constipation 08/18/2015  . Mass, ovarian 11/24/2013  . Esophagitis due to drug 10/19/2013  . Colon cancer screening 10/19/2013  . Bradycardia 06/05/2012  . Fracture of patella, left, closed 04/01/2012  . Fractured pelvis ([place]) 11/19/2011  . Celiac disease 11/13/2011  . Weakness generalized 09/27/2011  . Hyponatremia 09/27/2011  . 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 Dehydration 09/27/2011  . Anemia 09/21/2011  . Fall 09/21/2011  . Pelvic fracture ([place]) 09/21/2011  . Emphysema 09/21/2011  . Dysphagia 09/21/2011  . Chest pain 09/21/2011  . SIADH (syndrome of inappropriate ADH production) ([place]) 09/21/2011  . Hematoma 09/21/2011  . GERD (gastroesophageal reflux disease) 09/21/2011  . Hypertension 09/20/2011  . Hypothyroidism 09/20/2011  . Depression 09/20/2011  . Headache(784.0) 09/20/2011    Past Surgical History:  Procedure Laterality Date  . ESOPHAGOGASTRODUODENOSCOPY  09/27/2011   Inflammatory changes midesophagus most likely pill-induced injury (Fosamax), large diaphragmatic hernia, biopsies from small bowel consistent with celiac disease  . ESOPHAGOGASTRODUODENOSCOPY (EGD) WITH ESOPHAGEAL DILATION N/A 07/13/2013   CBJ:SEGBTD esophagitis-more likely pill-induced s/p esophageal biopsy. Noncritical. Schatzki's ring s/p passage of a Maloney dilator. Hiatal hernia. Abnormal duodenum consistent with prior diagnosis of celiac disease. Status post 36 French Maloney dilator. Esophageal biopsies benign showed marked acute and chronic inflammation.  Marland Kitchen LAPAROSCOPIC SALPINGO OOPHERECTOMY Bilateral 11/16/2013   Procedure: LAPAROSCOPIC BILATERAL SALPINGO OOPHORECTOMY;  Surgeon: Imagene Gurney A. Alycia Rossetti, MD;  Location: [facility];  Service:  Gynecology;  Laterality: Bilateral;  . left hip replacement    . left shoulder    . THYROIDECTOMY    . VERTEBROPLASTY      OB History    No data available     Home Medications    Prior to Admission medications   Medication Sig Start Date End Date  Taking? Authorizing Provider  acetaminophen (TYLENOL) 325 MG tablet Take 650 mg by mouth every 6 (six) hours as needed for mild pain.    Yes Historical Provider, MD  diclofenac (VOLTAREN) 50 MG EC tablet Take 50 mg by mouth daily.    Yes Historical Provider, MD  Iron-FA-B Cmp-C-Biot-Probiotic (FUSION PLUS PO) Take 1 capsule by mouth daily.    Yes Historical Provider, MD  levothyroxine (SYNTHROID, LEVOTHROID) 75 MCG tablet Take 75 mcg by mouth every morning.   Yes Historical Provider, MD  lisinopril (PRINIVIL,ZESTRIL) 40 MG tablet Take 40 mg by mouth every morning.    Yes Historical Provider, MD  methocarbamol (ROBAXIN) 500 MG tablet Take 1 tablet (500 mg total) by mouth every 6 (six) hours as needed for muscle spasms. 03/31/17  Yes Kathie Dike, MD  omeprazole (PRILOSEC) 20 MG capsule TAKE ONE CAPSULE BY MOUTH TWICE DAILY. 03/27/17  Yes Carlis Stable, NP  QUEtiapine (SEROQUEL) 200 MG tablet Take 200 mg by mouth at bedtime.   Yes Historical Provider, MD  lidocaine (LIDODERM) 5 % Place 1 patch onto the skin daily. Remove & Discard patch within 12 hours or as directed by MD 04/02/17   Merrily Pew, MD  oxyCODONE-acetaminophen (PERCOCET) 5-325 MG tablet Take 0.5-1 tablets by mouth every 6 (six) hours as needed for severe pain. 04/02/17   Merrily Pew, MD  predniSONE (DELTASONE) 20 MG tablet 1 tab daily for 5 days 04/02/17   Merrily Pew, MD    Family History Family History  Problem Relation Age of Onset  . Cancer Sister     brain tumor  . Hypertension Daughter   . Kidney disease Sister   . Hypertension Brother     Social History Social History  Substance Use Topics  . Smoking status: Never Smoker  . Smokeless tobacco: Never Used  . Alcohol use No     Comment: rarely   Allergies   Gluten  Review of Systems Review of Systems  Constitutional: Positive for fatigue.  Respiratory: Positive for cough.   Musculoskeletal: Positive for back pain.  Neurological: Positive for dizziness and  weakness.  All other systems reviewed and are negative.  Physical Exam Updated Vital Signs BP 105/68 (BP Location: Left Arm)   Pulse 73   Temp 97.5 F (36.4 C) (Oral)   Resp 18   Ht 4\' 11"  (1.499 m)   Wt 90 lb 13 oz (41.2 kg)   SpO2 96%   BMI 18.34 kg/m   Physical Exam  Constitutional: She is oriented to person, place, and time. She appears well-developed and well-nourished.  HENT:  Head: Normocephalic.  Eyes: EOM are normal.  Neck: Normal range of motion.  Cardiovascular:  Irregular rhythm   Pulmonary/Chest: Effort normal.  Crackles to bilateral lung bases, mild tachypnea   Abdominal: She exhibits no distension.  Musculoskeletal: Normal range of motion.  Neurological: She is alert and oriented to person, place, and time.  Skin:  No rashes on her back   Psychiatric: She has a normal mood and affect.  Nursing note and vitals reviewed.  ED Treatments / Results  DIAGNOSTIC STUDIES:  Oxygen Saturation is 96% on RA,  normal by my interpretation.    COORDINATION OF CARE:  1:44 PM Discussed treatment plan with pt at bedside and pt agreed to plan.  Labs (all labs ordered are listed, but only abnormal results are displayed) Labs Reviewed  LACTIC ACID, PLASMA - Abnormal; Notable for the following:       Result Value   Lactic Acid, Venous 2.0 (*)    All other components within normal limits  COMPREHENSIVE METABOLIC PANEL - Abnormal; Notable for the following:    Sodium 128 (*)    Chloride 92 (*)    Glucose, Bld 119 (*)    BUN 25 (*)    Creatinine, Ser 1.08 (*)    GFR calc non Af Amer 45 (*)    GFR calc Af Amer 53 (*)    All other components within normal limits  DIFFERENTIAL - Abnormal; Notable for the following:    Lymphs Abs 0.6 (*)    All other components within normal limits  LACTIC ACID, PLASMA  CBC  TROPONIN I  TROPONIN I  CBC WITH DIFFERENTIAL/PLATELET    EKG  EKG Interpretation  Date/Time:  Wednesday April 02 2017 13:42:49 EDT Ventricular Rate:   63 PR Interval:    QRS Duration: 103 QT Interval:  427 QTC Calculation: 438 R Axis:   57 Text Interpretation:  Sinus rhythm frequent Atrial premature complexes Abnormal R-wave progression, early transition Probable LVH with secondary repol abnrm Borderline ST elevation, inferior leads Confirmed by [facility] MD, Corene Cornea [phone]) on 04/02/2017 2:13:13 PM       Radiology Ct Angio Chest/abd/pel For Dissection W And/or Wo Contrast  Result Date: 04/02/2017 CLINICAL DATA:  Back pain for 3 days.  Evaluate for dissection. EXAM: CT ANGIOGRAPHY CHEST, ABDOMEN AND PELVIS TECHNIQUE: Multidetector CT imaging through the chest, abdomen and pelvis was performed using the standard protocol during bolus administration of intravenous contrast. Multiplanar reconstructed images and MIPs were obtained and reviewed to evaluate the vascular anatomy. CONTRAST:  80 cc Isovue 370 IV COMPARISON:  None. FINDINGS: CTA CHEST FINDINGS Cardiovascular: Heart is mildly enlarged. Aorta is tortuous, non aneurysmal. No dissection. Scattered aortic and coronary artery calcifications. Mediastinum/Nodes: Esophagus is dilated with air-fluid level. Trachea is unremarkable. No mediastinal, hilar, or axillary adenopathy. Lungs/Pleura: Small bilateral pleural effusions. Compressive/dependent atelectasis in the lower lobes posteriorly. Musculoskeletal: No acute bony abnormality. Chronic compression fracture in the mid thoracic spine. Severe kyphosis. Review of the MIP images confirms the above findings. CTA ABDOMEN AND PELVIS FINDINGS VASCULAR Aorta: Atherosclerotic calcifications throughout the aorta. No aneurysm for dissection. Maximum AP diameter 2.0 cm. Celiac: Patent SMA: Patent Renals: Single bilaterally, patent IMA: Patent Inflow: Patent.  No dissection. Veins: Grossly unremarkable. Review of the MIP images confirms the above findings. NON-VASCULAR Hepatobiliary: Scattered low-density lesions in the liver, likely cysts. The gallbladder is  elongated, extending into the right side of the pelvis. Gallbladder wall calcifications are present. Common bile duct is mildly dilated measuring 9 mm, likely related to patient's age. Pancreas: No focal abnormality or ductal dilatation. Spleen: No focal abnormality.  Normal size. Adrenals/Urinary Tract: Bilateral renal cysts. No hydronephrosis. Adrenal glands and urinary bladder are unremarkable. Stomach/Bowel: Moderate stool burden throughout the colon. No evidence of bowel obstruction. Lymphatic: No adenopathy. Reproductive: Uterus and adnexa unremarkable.  No mass. Other: No free fluid or free air. Musculoskeletal: No acute bony abnormality. Prior vertebroplasty at L1. Prior left hip replacement. Review of the MIP images confirms the above findings. IMPRESSION: No evidence of aortic aneurysm or dissection. Aortic atherosclerosis. Mild  cardiomegaly. Small bilateral pleural effusions. Compressive/ dependent atelectasis in the lower lobes. Elongated gallbladder extends into the pelvis. Scattered gallbladder wall calcifications compatible with early porcelain gallbladder. Moderate stool burden throughout the colon. No acute findings in the chest, abdomen or pelvis. Electronically Signed   By: Rolm Baptise M.D.   On: 04/02/2017 16:41    Procedures Procedures (including critical care time)  Medications Ordered in ED Medications  fentaNYL (SUBLIMAZE) injection 50 mcg (50 mcg Intravenous Given 04/02/17 1416)  sodium chloride 0.9 % bolus 500 mL (0 mLs Intravenous Stopped 04/02/17 1641)  iopamidol (ISOVUE-370) 76 % injection 80 mL (80 mLs Intravenous Contrast Given 04/02/17 1545)  sodium chloride 0.9 % bolus 500 mL (0 mLs Intravenous Stopped 04/02/17 1749)  fentaNYL (SUBLIMAZE) injection 50 mcg (50 mcg Intravenous Given 04/02/17 1841)    Initial Impression / Assessment and Plan / ED Course  I have reviewed the triage vital signs and the nursing notes.  Pertinent labs & imaging results that were available  during my care of the patient were reviewed by me and considered in my medical decision making (see chart for details).    Sent by primary doctor office for concern of pneumonia. There she was "gray and not looking well". Here she overall appears well but in pain. As she had a full workup for her back pain on previous admission to include MRI of her thoracic spine that was relatively normal concern was that there is some other issue causing her pain. I discussed with the family that we would evaluate cardiac, aortic and pulmonary causes for her symptoms While treating her pain as well.  ECG with evidence of possible ischemic changes in II, III aVF but poor baseline so repeated and still seems to be present and not there previously. Does not meet STEMI criteria currently, but will consult with cardiology regarding the same.   I discussed with cardiology and they feel like the changes are likely early repolarization abnormalities. Delta troponin serial EKGs didn't show any changes so I doubt she has ACS. CT scan done to evaluate for aortic dissection, pulmonary embolus, pneumonia or any other acute pathology and this was negative. She was found to have bilateral pleural effusions of uncertain significance. I doubt this is causing her back pain at this time. There is no pneumonia on her x-ray and her white blood cell count is improved making infection unlikely. Her pain is intermittently controlled. She has had persistently normal vital signs in the emergency department.  It is unclear at this time what the cause of her pain is. It very well could be a muscular cause, however I don't see any indication for admission at this time.  Suggested adding on lidocaine patches, heating/cold pads. Will change up pain medications and timing. Will also encourage PT/OT/SW referrals for possible placement vs home PT.   Final Clinical Impressions(s) / ED Diagnoses   Final diagnoses:  Back pain, unspecified back  location, unspecified back pain laterality, unspecified chronicity    New Prescriptions Discharge Medication List as of 04/02/2017  7:30 PM    START taking these medications   Details  lidocaine (LIDODERM) 5 % Place 1 patch onto the skin daily. Remove & Discard patch within 12 hours or as directed by MD, Starting Wed 04/02/2017, Print    oxyCODONE-acetaminophen (PERCOCET) 5-325 MG tablet Take 0.5-1 tablets by mouth every 6 (six) hours as needed for severe pain., Starting Wed 04/02/2017, Print    predniSONE (DELTASONE) 20 MG tablet 1 tab daily  for 5 days, Print         Merrily Pew, MD 04/02/17 2117

## 2017-04-02 NOTE — ED Notes (Signed)
Called lab at this time, states Troponin is running.

## 2017-04-02 NOTE — ED Notes (Signed)
Date and time results received: 04/02/17 1412  Test: Lactic Acid  Critical Value: 2.0  Name of Provider Notified: [place]  Orders Received? Or Actions Taken?: MD Notified, no additional orders at this time.

## 2017-04-02 NOTE — ED Notes (Signed)
Pt walked to nurses station and back with steady gait. This RN and 1 tech assisted, pt has walker at home.

## 2017-04-02 NOTE — ED Triage Notes (Signed)
Pt sent from [place], [place] at Dr. Juel Burrow office to evaluate pt for possible admission.  Continues to c/o back pain from a few days ago.

## 2017-04-02 NOTE — ED Notes (Signed)
Pt states she has continued severe back pain, previous admission r/t pain and compression fx. Was seen by PA at PCP office and told she had questionable pneumonia and "collapsed lung". Denies SOB,CP, increased work of breathing. Decreased appetite noted per family.

## 2017-04-02 NOTE — ED Notes (Signed)
Pt removed from bedpan at this time, no urine or stool noted.

## 2017-04-03 DIAGNOSIS — F0390 Unspecified dementia without behavioral disturbance: Secondary | ICD-10-CM | POA: Diagnosis not present

## 2017-04-03 DIAGNOSIS — K219 Gastro-esophageal reflux disease without esophagitis: Secondary | ICD-10-CM | POA: Diagnosis not present

## 2017-04-03 DIAGNOSIS — I499 Cardiac arrhythmia, unspecified: Secondary | ICD-10-CM | POA: Diagnosis not present

## 2017-04-03 DIAGNOSIS — D509 Iron deficiency anemia, unspecified: Secondary | ICD-10-CM | POA: Diagnosis not present

## 2017-04-03 DIAGNOSIS — I10 Essential (primary) hypertension: Secondary | ICD-10-CM | POA: Diagnosis not present

## 2017-04-03 DIAGNOSIS — Z981 Arthrodesis status: Secondary | ICD-10-CM | POA: Diagnosis not present

## 2017-04-03 DIAGNOSIS — E782 Mixed hyperlipidemia: Secondary | ICD-10-CM | POA: Diagnosis not present

## 2017-04-03 DIAGNOSIS — F339 Major depressive disorder, recurrent, unspecified: Secondary | ICD-10-CM | POA: Diagnosis not present

## 2017-04-03 DIAGNOSIS — M8580 Other specified disorders of bone density and structure, unspecified site: Secondary | ICD-10-CM | POA: Diagnosis not present

## 2017-04-03 DIAGNOSIS — F209 Schizophrenia, unspecified: Secondary | ICD-10-CM | POA: Diagnosis not present

## 2017-04-03 DIAGNOSIS — E039 Hypothyroidism, unspecified: Secondary | ICD-10-CM | POA: Diagnosis not present

## 2017-04-03 DIAGNOSIS — G459 Transient cerebral ischemic attack, unspecified: Secondary | ICD-10-CM | POA: Diagnosis not present

## 2017-04-03 DIAGNOSIS — Z96642 Presence of left artificial hip joint: Secondary | ICD-10-CM | POA: Diagnosis not present

## 2017-04-03 DIAGNOSIS — K900 Celiac disease: Secondary | ICD-10-CM | POA: Diagnosis not present

## 2017-04-03 DIAGNOSIS — M818 Other osteoporosis without current pathological fracture: Secondary | ICD-10-CM | POA: Diagnosis not present

## 2017-04-03 DIAGNOSIS — M4854XD Collapsed vertebra, not elsewhere classified, thoracic region, subsequent encounter for fracture with routine healing: Secondary | ICD-10-CM | POA: Diagnosis not present

## 2017-04-03 DIAGNOSIS — C049 Malignant neoplasm of floor of mouth, unspecified: Secondary | ICD-10-CM | POA: Diagnosis not present

## 2017-04-03 DIAGNOSIS — F419 Anxiety disorder, unspecified: Secondary | ICD-10-CM | POA: Diagnosis not present

## 2017-04-04 DIAGNOSIS — M4854XD Collapsed vertebra, not elsewhere classified, thoracic region, subsequent encounter for fracture with routine healing: Secondary | ICD-10-CM | POA: Diagnosis not present

## 2017-04-04 DIAGNOSIS — M8580 Other specified disorders of bone density and structure, unspecified site: Secondary | ICD-10-CM | POA: Diagnosis not present

## 2017-04-04 DIAGNOSIS — E039 Hypothyroidism, unspecified: Secondary | ICD-10-CM | POA: Diagnosis not present

## 2017-04-04 DIAGNOSIS — I10 Essential (primary) hypertension: Secondary | ICD-10-CM | POA: Diagnosis not present

## 2017-04-04 DIAGNOSIS — K219 Gastro-esophageal reflux disease without esophagitis: Secondary | ICD-10-CM | POA: Diagnosis not present

## 2017-04-04 DIAGNOSIS — F0390 Unspecified dementia without behavioral disturbance: Secondary | ICD-10-CM | POA: Diagnosis not present

## 2017-04-08 DIAGNOSIS — M4854XD Collapsed vertebra, not elsewhere classified, thoracic region, subsequent encounter for fracture with routine healing: Secondary | ICD-10-CM | POA: Diagnosis not present

## 2017-04-08 DIAGNOSIS — F332 Major depressive disorder, recurrent severe without psychotic features: Secondary | ICD-10-CM | POA: Diagnosis not present

## 2017-04-08 DIAGNOSIS — I10 Essential (primary) hypertension: Secondary | ICD-10-CM | POA: Diagnosis not present

## 2017-04-08 DIAGNOSIS — E039 Hypothyroidism, unspecified: Secondary | ICD-10-CM | POA: Diagnosis not present

## 2017-04-08 DIAGNOSIS — M8580 Other specified disorders of bone density and structure, unspecified site: Secondary | ICD-10-CM | POA: Diagnosis not present

## 2017-04-08 DIAGNOSIS — F0390 Unspecified dementia without behavioral disturbance: Secondary | ICD-10-CM | POA: Diagnosis not present

## 2017-04-08 DIAGNOSIS — K219 Gastro-esophageal reflux disease without esophagitis: Secondary | ICD-10-CM | POA: Diagnosis not present

## 2017-04-08 DIAGNOSIS — R45851 Suicidal ideations: Secondary | ICD-10-CM | POA: Diagnosis not present

## 2017-04-11 ENCOUNTER — Emergency Department (HOSPITAL_COMMUNITY)
Admission: EM | Admit: 2017-04-11 | Discharge: 2017-04-14 | Disposition: A | Discharge status: Home / Self Care | Payer: Medicare Other | Attending: Emergency Medicine | Admitting: Emergency Medicine

## 2017-04-11 ENCOUNTER — Encounter (HOSPITAL_COMMUNITY): Payer: Self-pay | Admitting: *Deleted

## 2017-04-11 DIAGNOSIS — F331 Major depressive disorder, recurrent, moderate: Secondary | ICD-10-CM | POA: Diagnosis not present

## 2017-04-11 DIAGNOSIS — F29 Unspecified psychosis not due to a substance or known physiological condition: Secondary | ICD-10-CM | POA: Diagnosis not present

## 2017-04-11 DIAGNOSIS — S22000A Wedge compression fracture of unspecified thoracic vertebra, initial encounter for closed fracture: Secondary | ICD-10-CM | POA: Diagnosis not present

## 2017-04-11 DIAGNOSIS — F0390 Unspecified dementia without behavioral disturbance: Secondary | ICD-10-CM | POA: Diagnosis present

## 2017-04-11 DIAGNOSIS — E039 Hypothyroidism, unspecified: Secondary | ICD-10-CM | POA: Diagnosis not present

## 2017-04-11 DIAGNOSIS — R638 Other symptoms and signs concerning food and fluid intake: Secondary | ICD-10-CM | POA: Diagnosis not present

## 2017-04-11 DIAGNOSIS — F332 Major depressive disorder, recurrent severe without psychotic features: Secondary | ICD-10-CM | POA: Diagnosis not present

## 2017-04-11 DIAGNOSIS — S22008A Other fracture of unspecified thoracic vertebra, initial encounter for closed fracture: Secondary | ICD-10-CM | POA: Diagnosis not present

## 2017-04-11 DIAGNOSIS — I10 Essential (primary) hypertension: Secondary | ICD-10-CM | POA: Insufficient documentation

## 2017-04-11 DIAGNOSIS — Z79899 Other long term (current) drug therapy: Secondary | ICD-10-CM | POA: Insufficient documentation

## 2017-04-11 DIAGNOSIS — Z96642 Presence of left artificial hip joint: Secondary | ICD-10-CM | POA: Insufficient documentation

## 2017-04-11 DIAGNOSIS — J90 Pleural effusion, not elsewhere classified: Secondary | ICD-10-CM | POA: Diagnosis not present

## 2017-04-11 DIAGNOSIS — R45851 Suicidal ideations: Secondary | ICD-10-CM

## 2017-04-11 DIAGNOSIS — Z0000 Encounter for general adult medical examination without abnormal findings: Secondary | ICD-10-CM

## 2017-04-11 DIAGNOSIS — F0280 Dementia in other diseases classified elsewhere without behavioral disturbance: Secondary | ICD-10-CM | POA: Diagnosis not present

## 2017-04-11 DIAGNOSIS — F339 Major depressive disorder, recurrent, unspecified: Secondary | ICD-10-CM | POA: Diagnosis present

## 2017-04-11 LAB — SALICYLATE LEVEL

## 2017-04-11 LAB — CBC
HCT: 44.1 % (ref 36.0–46.0)
Hemoglobin: 15.2 g/dL — ABNORMAL HIGH (ref 12.0–15.0)
MCH: 31.3 pg (ref 26.0–34.0)
MCHC: 34.5 g/dL (ref 30.0–36.0)
MCV: 90.9 fL (ref 78.0–100.0)
Platelets: 341 10*3/uL (ref 150–400)
RBC: 4.85 MIL/uL (ref 3.87–5.11)
RDW: 13.5 % (ref 11.5–15.5)
WBC: 9.8 10*3/uL (ref 4.0–10.5)

## 2017-04-11 LAB — COMPREHENSIVE METABOLIC PANEL
ALT: 20 U/L (ref 14–54)
AST: 30 U/L (ref 15–41)
Albumin: 3.6 g/dL (ref 3.5–5.0)
Alkaline Phosphatase: 67 U/L (ref 38–126)
Anion gap: 14 (ref 5–15)
BILIRUBIN TOTAL: 0.7 mg/dL (ref 0.3–1.2)
BUN: 20 mg/dL (ref 6–20)
CO2: 22 mmol/L (ref 22–32)
CREATININE: 0.87 mg/dL (ref 0.44–1.00)
Calcium: 9.5 mg/dL (ref 8.9–10.3)
Chloride: 100 mmol/L — ABNORMAL LOW (ref 101–111)
GFR, EST NON AFRICAN AMERICAN: 59 mL/min — AB (ref >60)
Glucose, Bld: 103 mg/dL — ABNORMAL HIGH (ref 65–99)
POTASSIUM: 3.5 mmol/L (ref 3.5–5.1)
Sodium: 136 mmol/L (ref 135–145)
TOTAL PROTEIN: 6.7 g/dL (ref 6.5–8.1)

## 2017-04-11 LAB — ETHANOL

## 2017-04-11 LAB — ACETAMINOPHEN LEVEL: ACETAMINOPHEN (TYLENOL), SERUM: 17 ug/mL (ref 10–30)

## 2017-04-11 MED ORDER — DICLOFENAC SODIUM 50 MG PO TBEC
50.0000 mg | DELAYED_RELEASE_TABLET | Freq: Every day | ORAL | Status: DC
  Administered 2017-04-11 – 2017-04-14 (×4): 50 mg via ORAL
  Filled 2017-04-11 (×4): qty 1

## 2017-04-11 MED ORDER — PANTOPRAZOLE SODIUM 40 MG PO TBEC
40.0000 mg | DELAYED_RELEASE_TABLET | Freq: Every day | ORAL | Status: DC
  Administered 2017-04-11 – 2017-04-14 (×4): 40 mg via ORAL
  Filled 2017-04-11 (×4): qty 1

## 2017-04-11 MED ORDER — LIDOCAINE 5 % EX PTCH
1.0000 | MEDICATED_PATCH | CUTANEOUS | Status: DC
  Administered 2017-04-11 – 2017-04-13 (×3): 1 via TRANSDERMAL
  Filled 2017-04-11 (×4): qty 1

## 2017-04-11 MED ORDER — ACETAMINOPHEN 325 MG PO TABS
650.0000 mg | ORAL_TABLET | Freq: Four times a day (QID) | ORAL | Status: DC | PRN
  Filled 2017-04-11: qty 2

## 2017-04-11 MED ORDER — ADULT MULTIVITAMIN W/MINERALS CH
1.0000 | ORAL_TABLET | Freq: Every day | ORAL | Status: DC
  Administered 2017-04-12 – 2017-04-14 (×3): 1 via ORAL
  Filled 2017-04-11 (×3): qty 1

## 2017-04-11 MED ORDER — LEVOTHYROXINE SODIUM 75 MCG PO TABS
75.0000 ug | ORAL_TABLET | Freq: Every day | ORAL | Status: DC
  Administered 2017-04-12 – 2017-04-14 (×3): 75 ug via ORAL
  Filled 2017-04-11 (×3): qty 1

## 2017-04-11 MED ORDER — LISINOPRIL 40 MG PO TABS
40.0000 mg | ORAL_TABLET | Freq: Every day | ORAL | Status: DC
  Filled 2017-04-11: qty 1

## 2017-04-11 NOTE — ED Notes (Signed)
Called pharmacy to send the medication that has been due since 4:30.  They need to check with her son, phone number of son given to pharmacy

## 2017-04-11 NOTE — ED Triage Notes (Signed)
Pt was sent here by her PCP due to SI.  Pt told her PCP that "I want to blow my brains out and die" and "I want you to place me in a mental hospital and leave me there so I am not a burden on anyone".  Pt was previously seen at [facility] and has been committed x3 for similar issues.  Family wanted her to be seen at [facility] as they feel that there was no resolution to her problems from her last visits.  Pt recently had a back injury and is taking prednisone and oxycodone

## 2017-04-11 NOTE — ED Notes (Signed)
Pt was silently crying in her room.  Went and spoke with her.  She tells me that she is originally from [place] and sings the national anthem to me.  We talk about the [place] of [place] and pt is a little confused but calms down after.

## 2017-04-11 NOTE — ED Notes (Signed)
Pt stated "I want to die.  I never thought I'd be like this."

## 2017-04-11 NOTE — ED Notes (Signed)
Pt is asleep.  Placed some tea at her bedside for her to have when she wakes up

## 2017-04-11 NOTE — ED Provider Notes (Signed)
[facility] DEPT Provider Note   CSN: [id number] Arrival date & time: 04/11/17  1206     History   Chief Complaint Chief Complaint  Patient presents with  . Suicidal    HPI Felicia Harvey is a 83 y.o. female.  HPI He is referred to [facility] emergency department for depression and suicidal ideation. Patient reports that things just keep getting worse for her. She reports that she tries to be helpful and be a supportive part of her family but she just isn't able to do what she used to be ABLE due. She reported to her doctor that she, "wanted to blow her brains out and die. I want you to place in a mental hospital only me there so I am not a burden on anyone". Patient reports that she started having problems like this over the past several weeks to months. She reports that it just keeps going on and on. She denies being ill recently. Past Medical History:  Diagnosis Date  . Anxiety   . Celiac disease   . Chest pain 09/21/2011   OCCASIONAL  . Constipation   . Dementia   . Difficulty sleeping    TAKES MED TO SLEEP  . Dysphagia 09/21/2011  . Dysrhythmia    "heart arrthymia" per son, but unsure what kind  . GERD (gastroesophageal reflux disease)   . HTN (hypertension)   . Hypothyroidism   . Knee fracture, left 03/2012  . Osteopenia   . Ovarian mass   . 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 Schizophrenia ([place])    FAMILY UNSURE OF THIS    Patient Active Problem List   Diagnosis Date Noted  . Back pain 03/30/2017  . Dementia 03/30/2017  . Constipation 08/18/2015  . Mass, ovarian 11/24/2013  . Esophagitis due to drug 10/19/2013  . Colon cancer screening 10/19/2013  . Bradycardia 06/05/2012  . Fracture of patella, left, closed 04/01/2012  . Fractured pelvis ([place]) 11/19/2011  . Celiac disease 11/13/2011  . Weakness generalized 09/27/2011  . Hyponatremia 09/27/2011  . 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 Dehydration 09/27/2011  . Anemia 09/21/2011  . Fall 09/21/2011    . Pelvic fracture ([place]) 09/21/2011  . Emphysema 09/21/2011  . Dysphagia 09/21/2011  . Chest pain 09/21/2011  . SIADH (syndrome of inappropriate ADH production) ([place]) 09/21/2011  . Hematoma 09/21/2011  . GERD (gastroesophageal reflux disease) 09/21/2011  . Hypertension 09/20/2011  . Hypothyroidism 09/20/2011  . Depression 09/20/2011  . Headache(784.0) 09/20/2011    Past Surgical History:  Procedure Laterality Date  . ESOPHAGOGASTRODUODENOSCOPY  09/27/2011   Inflammatory changes midesophagus most likely pill-induced injury (Fosamax), large diaphragmatic hernia, biopsies from small bowel consistent with celiac disease  . ESOPHAGOGASTRODUODENOSCOPY (EGD) WITH ESOPHAGEAL DILATION N/A 07/13/2013   FTD:DUKGUR esophagitis-more likely pill-induced s/p esophageal biopsy. Noncritical. Schatzki's ring s/p passage of a Maloney dilator. Hiatal hernia. Abnormal duodenum consistent with prior diagnosis of celiac disease. Status post 56 French Maloney dilator. Esophageal biopsies benign showed marked acute and chronic inflammation.  Marland Kitchen LAPAROSCOPIC SALPINGO OOPHERECTOMY Bilateral 11/16/2013   Procedure: LAPAROSCOPIC BILATERAL SALPINGO OOPHORECTOMY;  Surgeon: Imagene Gurney A. Alycia Rossetti, MD;  Location: [facility] ORS;  Service: Gynecology;  Laterality: Bilateral;  . left hip replacement    . left shoulder    . THYROIDECTOMY    . VERTEBROPLASTY      OB History    No data available       Home Medications    Prior to Admission medications   Medication Sig Start Date End Date Taking?  Authorizing Provider  acetaminophen (TYLENOL) 325 MG tablet Take 650 mg by mouth every 6 (six) hours as needed for mild pain.     Historical Provider, MD  diclofenac (VOLTAREN) 50 MG EC tablet Take 50 mg by mouth daily.     Historical Provider, MD  Iron-FA-B Cmp-C-Biot-Probiotic (FUSION PLUS PO) Take 1 capsule by mouth daily.     Historical Provider, MD  levothyroxine (SYNTHROID, LEVOTHROID) 75 MCG tablet Take 75 mcg by mouth every morning.     Historical Provider, MD  lidocaine (LIDODERM) 5 % Place 1 patch onto the skin daily. Remove & Discard patch within 12 hours or as directed by MD 04/02/17   Merrily Pew, MD  lisinopril (PRINIVIL,ZESTRIL) 40 MG tablet Take 40 mg by mouth every morning.     Historical Provider, MD  methocarbamol (ROBAXIN) 500 MG tablet Take 1 tablet (500 mg total) by mouth every 6 (six) hours as needed for muscle spasms. 03/31/17   Kathie Dike, MD  omeprazole (PRILOSEC) 20 MG capsule TAKE ONE CAPSULE BY MOUTH TWICE DAILY. 03/27/17   Carlis Stable, NP  oxyCODONE-acetaminophen (PERCOCET) 5-325 MG tablet Take 0.5-1 tablets by mouth every 6 (six) hours as needed for severe pain. 04/02/17   Merrily Pew, MD  predniSONE (DELTASONE) 20 MG tablet 1 tab daily for 5 days 04/02/17   Merrily Pew, MD  QUEtiapine (SEROQUEL) 200 MG tablet Take 200 mg by mouth at bedtime.    Historical Provider, MD    Family History Family History  Problem Relation Age of Onset  . Cancer Sister     brain tumor  . Hypertension Daughter   . Kidney disease Sister   . Hypertension Brother     Social History Social History  Substance Use Topics  . Smoking status: Never Smoker  . Smokeless tobacco: Never Used  . Alcohol use No     Comment: rarely     Allergies   Gluten   Review of Systems Review of Systems 10 Systems reviewed and are negative for acute change except as noted in the HPI.   Physical Exam Updated Vital Signs BP 138/84 (BP Location: Left Arm)   Pulse 73   Temp 97.4 F (36.3 C) (Oral)   Resp 15   SpO2 96%   Physical Exam  Constitutional: She is oriented to person, place, and time. She appears well-developed and well-nourished. No distress.  HENT:  Head: Normocephalic and atraumatic.  Nose: Nose normal.  Mouth/Throat: Oropharynx is clear and moist.  Eyes: Conjunctivae and EOM are normal. Pupils are equal, round, and reactive to light.  Neck: Neck supple.  Cardiovascular: Normal rate, regular rhythm, normal  heart sounds and intact distal pulses.   No murmur heard. Pulmonary/Chest: Effort normal and breath sounds normal. No respiratory distress.  Abdominal: Soft. She exhibits no distension. There is no tenderness.  Musculoskeletal: She exhibits no edema.  Neurological: She is alert and oriented to person, place, and time. No cranial nerve deficit. She exhibits normal muscle tone. Coordination normal.  Skin: Skin is warm and dry.  Psychiatric: She has a normal mood and affect.  Nursing note and vitals reviewed.    ED Treatments / Results  Labs (all labs ordered are listed, but only abnormal results are displayed) Labs Reviewed  COMPREHENSIVE METABOLIC PANEL - Abnormal; Notable for the following:       Result Value   Chloride 100 (*)    Glucose, Bld 103 (*)    GFR calc non Af Amer 59 (*)  All other components within normal limits  CBC - Abnormal; Notable for the following:    Hemoglobin 15.2 (*)    All other components within normal limits  ETHANOL  SALICYLATE LEVEL  ACETAMINOPHEN LEVEL  RAPID URINE DRUG SCREEN, HOSP PERFORMED    EKG  EKG Interpretation None       Radiology No results found.  Procedures Procedures (including critical care time)  Medications Ordered in ED Medications  acetaminophen (TYLENOL) tablet 650 mg (not administered)  diclofenac (VOLTAREN) EC tablet 50 mg (not administered)  FUSION PLUS CAPS (not administered)  levothyroxine (SYNTHROID, LEVOTHROID) tablet 75 mcg (not administered)  lidocaine (LIDODERM) 5 % 1 patch (not administered)     Initial Impression / Assessment and Plan / ED Course  I have reviewed the triage vital signs and the nursing notes.  Pertinent labs & imaging results that were available during my care of the patient were reviewed by me and considered in my medical decision making (see chart for details).     Final Clinical Impressions(s) / ED Diagnoses   Final diagnoses:  Suicidal ideation  Severe episode of  recurrent major depressive disorder, without psychotic features ([place])   Patient is having recurrent severe depression with suicidal ideation. She is alert appropriate interactive. She does not appear to have acute medical problems. Patient is medically clear for psychiatric evaluation and admission. New Prescriptions New Prescriptions   No medications on file     Charlesetta Shanks, MD 04/11/17 [phone]

## 2017-04-11 NOTE — BHH Counselor (Signed)
Clinician referred the pt to the following inpatient treatment facilities:   [facility]  TTS will continue to follow up and seek placement.    Edd Fabian, MS, [facility], [facility] Triage Specialist [phone]

## 2017-04-11 NOTE — BH Assessment (Addendum)
Assessment Note  Felicia Harvey is an 83 y.o. female that presents this date with thoughts of self harm with a plan to overdose. Patient has difficulty hearing as this Probation officer attempts to conduct assessment. Patient is alert and oriented to time/place but becomes confused/disorganized at times. Patient states she is "old and tired of living" with a plan to overdose on her medications although patient does not have access to those medications. This Probation officer contacted patient's son Juliany Daughety [phone] to assist with collateral information. Patient's son reports patient fell two weeks ago in their home (patient's son has been residing with mother for over two years) and broke four ribs. Patient's son stated patient's mental state has steadily decompensated since that time with patient becoming confused and at times, does not have any active recall. Patient was diagnosed with Schizophrenia over 20 years ago and has been receiving medications through a local provider Nevada Crane MD, for the last ten years. Patient's son states mother has also been diagnosed with dementia but has been able to care for herself with minimal assistance. Patient does require some assistance with ADL's although son stated the patient has been very independent until two weeks ago. Patient was seen this date at her PCP Nevada Crane MD when patient made statements in reference to taking her life. Patient was sent to [facility] to be evaluated. Patient's son states patient has never attempted to harm herself but has been talking about "going to sleep forever" if she could "just get to her medications." Per notes, patient's history is limited by the patient's dementia. Patient was sent here by her PCP due to [place].  Patient told her PCP that "I want to blow my brains out and die" and "I want you to place me in a mental hospital and leave me there so I am not a burden on anyone".  Patient was previously seen at [facility] and has been committed x3 for similar issues. Family  wanted her to be seen at [facility] as they feel that there was no resolution to her problems from her last visits. Patient recently had a back injury and is taking prednisone and oxycodone. Case was staffed with Reita Cliche DNP who recommended patient be admitted inpatient as appropriate bed placement (Gero-Psych) is investigated.  Diagnosis: Schizophrenia, Dementia  Past Medical History:  Past Medical History:  Diagnosis Date  . Anxiety   . Celiac disease   . Chest pain 09/21/2011   OCCASIONAL  . Constipation   . Dementia   . Difficulty sleeping    TAKES MED TO SLEEP  . Dysphagia 09/21/2011  . Dysrhythmia    "heart arrthymia" per son, but unsure what kind  . GERD (gastroesophageal reflux disease)   . HTN (hypertension)   . Hypothyroidism   . Knee fracture, left 03/2012  . Osteopenia   . Ovarian mass   . 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 Schizophrenia ([place])    FAMILY UNSURE OF THIS    Past Surgical History:  Procedure Laterality Date  . ESOPHAGOGASTRODUODENOSCOPY  09/27/2011   Inflammatory changes midesophagus most likely pill-induced injury (Fosamax), large diaphragmatic hernia, biopsies from small bowel consistent with celiac disease  . ESOPHAGOGASTRODUODENOSCOPY (EGD) WITH ESOPHAGEAL DILATION N/A 07/13/2013   MPN:TIRWER esophagitis-more likely pill-induced s/p esophageal biopsy. Noncritical. Schatzki's ring s/p passage of a Maloney dilator. Hiatal hernia. Abnormal duodenum consistent with prior diagnosis of celiac disease. Status post 56 French Maloney dilator. Esophageal biopsies benign showed marked acute and chronic inflammation.  Marland Kitchen LAPAROSCOPIC SALPINGO OOPHERECTOMY Bilateral 11/16/2013  Procedure: LAPAROSCOPIC BILATERAL SALPINGO OOPHORECTOMY;  Surgeon: Imagene Gurney A. Alycia Rossetti, MD;  Location: [facility];  Service: Gynecology;  Laterality: Bilateral;  . left hip replacement    . left shoulder    . THYROIDECTOMY    . VERTEBROPLASTY      Family History:  Family History  Problem Relation Age of  Onset  . Cancer Sister     brain tumor  . Hypertension Daughter   . Kidney disease Sister   . Hypertension Brother     Social History:  reports that she has never smoked. She has never used smokeless tobacco. She reports that she does not drink alcohol or use drugs.  Additional Social History:  Alcohol / Drug Use Pain Medications: See MAR Prescriptions: See MAR Over the Counter: See MAR History of alcohol / drug use?: No history of alcohol / drug abuse Longest period of sobriety (when/how long):  (Denies) Negative Consequences of Use:  (Denies) Withdrawal Symptoms:  (Denies)  CIWA: CIWA-Ar BP: (!) 180/92 (pt c/o back pain-RN notified ) Pulse Rate: 98 COWS:    Allergies:  Allergies  Allergen Reactions  . Gluten     Celiac disease    Home Medications:  (Not in a hospital admission)  OB/GYN Status:  No LMP recorded. Patient is postmenopausal.  General Assessment Data [facility] of [facility]: [facility] T[facility] Assessment: In system Is this a Tele or Face-to-Face Assessment?: Face-to-Face Is this an Initial Assessment or a Re-assessment for this encounter?: Initial Assessment Marital status: Widowed [place] name: na Is patient pregnant?: No Pregnancy Status: No Living Arrangements: Children Can pt return to current living arrangement?: Yes Admission Status: Voluntary Is patient capable of signing voluntary admission?: Yes Referral Source: Self/Family/Friend Insurance type: Medicare  Medical Screening Exam ([place]) Medical Exam completed: Yes  Crisis Care Plan Living Arrangements: Children Legal Guardian:  (NA) Name of Psychiatrist: Nevada Crane MD Name of Therapist: None  Education Status Is patient currently in school?: No Current Grade:  (NA) Highest grade of school patient has completed:  (NA) Name of school:  (NA) Contact person:  (NA)  Risk to self with the past 6 months Suicidal Ideation: Yes-Currently Present Has patient been a risk to self within the  past 6 months prior to admission? : No Suicidal Intent: Yes-Currently Present Has patient had any suicidal intent within the past 6 months prior to admission? : No Is patient at risk for suicide?: No Suicidal Plan?: Yes-Currently Present Has patient had any suicidal plan within the past 6 months prior to admission? : No Specify Current Suicidal Plan: Overdose Access to Means: No What has been your use of drugs/alcohol within the last 12 months?: Denies Previous Attempts/Gestures: No How many times?:  (NA) Other Self Harm Risks:  (NA) Triggers for Past Attempts: Unknown Intentional Self Injurious Behavior: None Family Suicide History: No Recent stressful life event(s): Other (Comment) (patient's declining health) Persecutory voices/beliefs?: No Depression: Yes Depression Symptoms: Feeling worthless/self pity Substance abuse history and/or treatment for substance abuse?: No Suicide prevention information given to non-admitted patients: Not applicable  Risk to Others within the past 6 months Homicidal Ideation: No Does patient have any lifetime risk of violence toward others beyond the six months prior to admission? : No Thoughts of Harm to Others: No Current Homicidal Intent: No Current Homicidal Plan: No Access to Homicidal Means: No Identified Victim: NA History of harm to others?: No Assessment of Violence: None Noted Violent Behavior Description: NA Does patient have access to weapons?: No Criminal Charges  Pending?: No Does patient have a court date: No Is patient on probation?: No  Psychosis Hallucinations: None noted Delusions: None noted  Mental Status Report Appearance/Hygiene: In scrubs Eye Contact: Fair Motor Activity: Freedom of movement Speech: Soft, Slow Level of Consciousness: Quiet/awake Mood: Depressed Affect: Appropriate to circumstance Anxiety Level: Minimal Thought Processes: Coherent, Relevant Judgement: Partial Orientation: Person, Place,  Time Obsessive Compulsive Thoughts/Behaviors: None  Cognitive Functioning Concentration: Decreased Memory: Recent Impaired IQ: Average Insight: Good Impulse Control: Fair Appetite: Fair Weight Loss:  (0) Weight Gain:  (0) Sleep: Decreased Total Hours of Sleep: 4 Vegetative Symptoms: None  ADLScreening [facility] Assessment Services) Patient's cognitive ability adequate to safely complete daily activities?: Yes Patient able to express need for assistance with ADLs?: Yes Independently performs ADLs?: No  Prior Inpatient Therapy Prior Inpatient Therapy: No Prior Therapy Dates: NA (NA) Prior Therapy Facilty/Provider(s):  (NA) Reason for Treatment: NA  Prior Outpatient Therapy Prior Outpatient Therapy: No Prior Therapy Dates: NA Prior Therapy Facilty/Provider(s): NA Reason for Treatment: NA Does patient have an ACCT team?: No Does patient have Intensive In-House Services?  : No Does patient have [facility] services? : No Does patient have P4CC services?: No  ADL Screening (condition at time of admission) Patient's cognitive ability adequate to safely complete daily activities?: Yes Is the patient deaf or have difficulty hearing?: Yes Does the patient have difficulty seeing, even when wearing glasses/contacts?: Yes Does the patient have difficulty concentrating, remembering, or making decisions?: No Patient able to express need for assistance with ADLs?: Yes Does the patient have difficulty dressing or bathing?: Yes Independently performs ADLs?: No Communication: Independent Dressing (OT): Needs assistance Is this a change from baseline?: Pre-admission baseline Grooming: Needs assistance Is this a change from baseline?: Pre-admission baseline Feeding: Needs assistance Is this a change from baseline?: Pre-admission baseline Bathing: Needs assistance Is this a change from baseline?: Pre-admission baseline Toileting: Needs assistance Is this a change from baseline?: Pre-admission  baseline In/Out Bed: Needs assistance Is this a change from baseline?: Pre-admission baseline Walks in Home: Needs assistance Is this a change from baseline?: Pre-admission baseline Does the patient have difficulty walking or climbing stairs?: Yes Weakness of Legs: Both Weakness of Arms/Hands: Both  Home Assistive Devices/Equipment Home Assistive Devices/Equipment: None  Therapy Consults (therapy consults require a physician order) PT Evaluation Needed: No OT Evalulation Needed: No SLP Evaluation Needed: No Abuse/Neglect Assessment (Assessment to be complete while patient is alone) Physical Abuse: Denies Verbal Abuse: Denies Sexual Abuse: Denies Exploitation of patient/patient's resources: Denies Self-Neglect: Denies Values / Beliefs Cultural Requests During Hospitalization: None Spiritual Requests During Hospitalization: None Consults Spiritual Care Consult Needed: No Advance Directives (For Healthcare) Does Patient Have a Medical Advance Directive?: Yes Does patient want to make changes to medical advance directive?: No - Patient declined Type of Advance Directive: Healthcare Power of [place] in Chart?: No - copy requested Would patient like information on creating a medical advance directive?: No - Patient declined    Additional Information 1:1 In Past 12 Months?: No CIRT Risk: No Elopement Risk: No     Disposition: Case was staffed with Reita Cliche DNP who recommended patient be admitted inpatient as appropriate bed placement (Gero-Psych) is investigated.  Disposition Initial Assessment Completed for this Encounter: Yes Disposition of Patient: Inpatient treatment program Type of inpatient treatment program: Adult  On Site Evaluation by:   Reviewed with Physician:    Mamie Nick 04/11/2017 6:36 PM

## 2017-04-11 NOTE — ED Notes (Signed)
Pt is tearful, she states that she feels sad and misses her son.  NT consoling pt.  Notified pharmacy for meds as they are not showing up in pyxis

## 2017-04-11 NOTE — ED Notes (Signed)
Bed: WA27 Expected date:  Expected time:  Means of arrival:  Comments: EMS-medical clearance

## 2017-04-11 NOTE — BH Assessment (Signed)
[facility] Assessment Progress Note Case was staffed with Reita Cliche DNP who recommended patient be admitted inpatient as appropriate bed placement (Gero-Psych) is investigated.

## 2017-04-11 NOTE — ED Notes (Signed)
Gave pt a gluten free tray.  Pt barely ate anything

## 2017-04-12 ENCOUNTER — Emergency Department (HOSPITAL_COMMUNITY): Payer: Medicare Other

## 2017-04-12 DIAGNOSIS — F0280 Dementia in other diseases classified elsewhere without behavioral disturbance: Secondary | ICD-10-CM

## 2017-04-12 DIAGNOSIS — F331 Major depressive disorder, recurrent, moderate: Secondary | ICD-10-CM | POA: Diagnosis not present

## 2017-04-12 DIAGNOSIS — J90 Pleural effusion, not elsewhere classified: Secondary | ICD-10-CM | POA: Diagnosis not present

## 2017-04-12 DIAGNOSIS — F339 Major depressive disorder, recurrent, unspecified: Secondary | ICD-10-CM | POA: Diagnosis present

## 2017-04-12 DIAGNOSIS — R45851 Suicidal ideations: Secondary | ICD-10-CM | POA: Diagnosis not present

## 2017-04-12 LAB — RAPID URINE DRUG SCREEN, HOSP PERFORMED
Amphetamines: NOT DETECTED
Barbiturates: NOT DETECTED
Benzodiazepines: POSITIVE — AB
Cocaine: NOT DETECTED
Opiates: POSITIVE — AB
Tetrahydrocannabinol: NOT DETECTED

## 2017-04-12 MED ORDER — ESCITALOPRAM OXALATE 10 MG PO TABS
10.0000 mg | ORAL_TABLET | Freq: Every day | ORAL | Status: DC
  Administered 2017-04-12 – 2017-04-14 (×3): 10 mg via ORAL
  Filled 2017-04-12 (×3): qty 1

## 2017-04-12 MED ORDER — QUETIAPINE FUMARATE 50 MG PO TABS
50.0000 mg | ORAL_TABLET | Freq: Every day | ORAL | Status: DC
  Administered 2017-04-12 – 2017-04-13 (×2): 50 mg via ORAL
  Filled 2017-04-12 (×2): qty 1

## 2017-04-12 MED ORDER — LISINOPRIL 20 MG PO TABS
40.0000 mg | ORAL_TABLET | Freq: Every day | ORAL | Status: DC
  Administered 2017-04-12 – 2017-04-14 (×3): 40 mg via ORAL
  Filled 2017-04-12 (×3): qty 2

## 2017-04-12 NOTE — Progress Notes (Signed)
CSW contacted by [facility] staff member from [place], about patient's referral. CSW and staff member discussed patient's current living arrangements and patient's ADLs. Staff member reported that she would call back later after staffing patient's referral.    Abundio Miu, [facility]  Clinical Social Worker [phone]

## 2017-04-12 NOTE — ED Notes (Signed)
Pt said she is waiting for her son to bring her back to [place], and she has been here for around 6 weeks.

## 2017-04-12 NOTE — ED Notes (Signed)
Pt talking to herself making repetitive statements.

## 2017-04-12 NOTE — Progress Notes (Signed)
CSW contacted [facility] to inquire about patient's referral. Staff member Shirlee Limerick reported that they did not have patient's referral and requested that CSW resend referral, CSW agreed.   CSW faxed patient's referral to: [place], [place], [place], [place] and [place].   Abundio Miu, [facility]  Clinical Social Worker [phone]

## 2017-04-12 NOTE — Progress Notes (Signed)
Pt refused all food. Nurse notified

## 2017-04-12 NOTE — Consult Note (Signed)
[facility] Consult   Reason for Consult:  Psychiatric evaluation Referring Physician:  Evaluation Patient Identification: Felicia Harvey MRN:  [id number] Principal Diagnosis: Major depression, recurrent ([place]) Diagnosis:   Patient Active Problem List   Diagnosis Date Noted  . Major depression, recurrent ([place]) [F33.9] 04/12/2017    Priority: High  . Dementia without behavioral disturbance [F03.90] 03/30/2017    Priority: High  . Back pain [M54.9] 03/30/2017  . Constipation [K59.00] 08/18/2015  . Mass, ovarian [N83.9] 11/24/2013  . Esophagitis due to drug [K20.8, T50.904A] 10/19/2013  . Colon cancer screening [Z12.11] 10/19/2013  . Bradycardia [R00.1] 06/05/2012  . Fracture of patella, left, closed [S82.002A] 04/01/2012  . Fractured pelvis ([place]) [S32.9XXA] 11/19/2011  . Celiac disease [K90.0] 11/13/2011  . Weakness generalized [R53.1] 09/27/2011  . Hyponatremia [E87.1] 09/27/2011  . Psychogenic polydipsia [R63.1, F54] 09/27/2011  . Dehydration [E86.0] 09/27/2011  . Anemia [D64.9] 09/21/2011  . Fall [W19.XXXA] 09/21/2011  . Pelvic fracture ([facility]) [S32.9XXA] 09/21/2011  . Emphysema [492] 09/21/2011  . Dysphagia [R13.10] 09/21/2011  . Chest pain [786.5] 09/21/2011  . SIADH (syndrome of inappropriate ADH production) ([place]) [E22.2] 09/21/2011  . Hematoma [T14.8XXA] 09/21/2011  . GERD (gastroesophageal reflux disease) [K21.9] 09/21/2011  . Hypertension [I10] 09/20/2011  . Hypothyroidism [E03.9] 09/20/2011  . Depression [F32.9] 09/20/2011  . Headache(784.0) [R51] 09/20/2011    Total Time spent with patient: 45 minutes  Subjective:   Felicia Harvey is a 83 y.o. female patient admitted with depression.  HPI:  Patient with multiple medical problem including Dementia who was brought to [facility] by her family. Patient reports worsening depression and suicidal thoughts with plan to overdose. Patient also says, "I think about blowing my brain out and die. I don't want to be a  burden anymore.'' Patient denies delusional thinking or psychosis.  Past Psychiatric History: as above  Risk to Self: Suicidal Ideation: Yes-Currently Present Suicidal Intent: Yes-Currently Present Is patient at risk for suicide?: No Suicidal Plan?: Yes-Currently Present Specify Current Suicidal Plan: Overdose Access to Means: No What has been your use of drugs/alcohol within the last 12 months?: Denies How many times?:  (NA) Other Self Harm Risks:  (NA) Triggers for Past Attempts: Unknown Intentional Self Injurious Behavior: None Risk to Others: Homicidal Ideation: No Thoughts of Harm to Others: No Current Homicidal Intent: No Current Homicidal Plan: No Access to Homicidal Means: No Identified Victim: NA History of harm to others?: No Assessment of Violence: None Noted Violent Behavior Description: NA Does patient have access to weapons?: No Criminal Charges Pending?: No Does patient have a court date: No Prior Inpatient Therapy: Prior Inpatient Therapy: No Prior Therapy Dates: NA (NA) Prior Therapy Facilty/Provider(s):  (NA) Reason for Treatment: NA Prior Outpatient Therapy: Prior Outpatient Therapy: No Prior Therapy Dates: NA Prior Therapy Facilty/Provider(s): NA Reason for Treatment: NA Does patient have an ACCT team?: No Does patient have Intensive In-House Services?  : No Does patient have [facility] services? : No Does patient have P4CC services?: No  Past Medical History:  Past Medical History:  Diagnosis Date  . Anxiety   . Celiac disease   . Chest pain 09/21/2011   OCCASIONAL  . Constipation   . Dementia   . Difficulty sleeping    TAKES MED TO SLEEP  . Dysphagia 09/21/2011  . Dysrhythmia    "heart arrthymia" per son, but unsure what kind  . GERD (gastroesophageal reflux disease)   . HTN (hypertension)   . Hypothyroidism   . Knee fracture, left 03/2012  .  Osteopenia   . Ovarian mass   . 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 Schizophrenia ([place])     FAMILY UNSURE OF THIS    Past Surgical History:  Procedure Laterality Date  . ESOPHAGOGASTRODUODENOSCOPY  09/27/2011   Inflammatory changes midesophagus most likely pill-induced injury (Fosamax), large diaphragmatic hernia, biopsies from small bowel consistent with celiac disease  . ESOPHAGOGASTRODUODENOSCOPY (EGD) WITH ESOPHAGEAL DILATION N/A 07/13/2013   [id number]:[id number] esophagitis-more likely pill-induced s/p esophageal biopsy. Noncritical. Schatzki's ring s/p passage of a Maloney dilator. Hiatal hernia. Abnormal duodenum consistent with prior diagnosis of celiac disease. Status post 7 French Maloney dilator. Esophageal biopsies benign showed marked acute and chronic inflammation.  Marland Kitchen LAPAROSCOPIC SALPINGO OOPHERECTOMY Bilateral 11/16/2013   Procedure: LAPAROSCOPIC BILATERAL SALPINGO OOPHORECTOMY;  Surgeon: Imagene Gurney A. Alycia Rossetti, MD;  Location: [facility];  Service: Gynecology;  Laterality: Bilateral;  . left hip replacement    . left shoulder    . THYROIDECTOMY    . VERTEBROPLASTY     Family History:  Family History  Problem Relation Age of [place]  . Cancer Sister     brain tumor  . Hypertension Daughter   . Kidney disease Sister   . Hypertension Brother    Family Psychiatric  History:  Social History:  History  Alcohol Use No    Comment: rarely     History  Drug Use No    Social History   Social History  . Marital status: Divorced    Spouse name: N/A  . Number of children: N/A  . Years of education: 12th   Occupational History  . Retired Retired   Social History Main Topics  . Smoking status: Never Smoker  . Smokeless tobacco: Never Used  . Alcohol use No     Comment: rarely  . Drug use: No  . Sexual activity: Not Currently    Birth control/ protection: Post-menopausal   Other Topics Concern  . None   Social History Narrative  . None   Additional Social History:    Allergies:   Allergies  Allergen Reactions  . Gluten     Celiac disease    Labs:  Results for  orders placed or performed during the hospital encounter of 04/11/17 (from the past 48 hour(s))  Comprehensive metabolic panel     Status: Abnormal   Collection Time: 04/11/17 12:56 PM  Result Value Ref Range   Sodium 136 135 - 145 mmol/L   Potassium 3.5 3.5 - 5.1 mmol/L   Chloride 100 (L) 101 - 111 mmol/L   CO2 22 22 - 32 mmol/L   Glucose, Bld 103 (H) 65 - 99 mg/dL   BUN 20 6 - 20 mg/dL   Creatinine, Ser 0.87 0.44 - 1.00 mg/dL   Calcium 9.5 8.9 - 10.3 mg/dL   Total Protein 6.7 6.5 - 8.1 g/dL   Albumin 3.6 3.5 - 5.0 g/dL   AST 30 15 - 41 U/L   ALT 20 14 - 54 U/L   Alkaline Phosphatase 67 38 - 126 U/L   Total Bilirubin 0.7 0.3 - 1.2 mg/dL   GFR calc non Af Amer 59 (L) >60 mL/min   GFR calc Af Amer >60 >60 mL/min    Comment: (NOTE) The eGFR has been calculated using the CKD EPI equation. This calculation has not been validated in all clinical situations. eGFR's persistently <60 mL/min signify possible Chronic Kidney Disease.    Anion gap 14 5 - 15  Ethanol     Status: None   Collection Time:  04/11/17 12:56 PM  Result Value Ref Range   Alcohol, Ethyl (B) <5 <5 mg/dL    Comment:        LOWEST DETECTABLE LIMIT FOR SERUM ALCOHOL IS 5 mg/dL FOR MEDICAL PURPOSES ONLY   Salicylate level     Status: None   Collection Time: 04/11/17 12:56 PM  Result Value Ref Range   Salicylate Lvl <0.3 2.8 - 30.0 mg/dL  Acetaminophen level     Status: None   Collection Time: 04/11/17 12:56 PM  Result Value Ref Range   Acetaminophen (Tylenol), Serum 17 10 - 30 ug/mL    Comment:        THERAPEUTIC CONCENTRATIONS VARY SIGNIFICANTLY. A RANGE OF 10-30 ug/mL MAY BE AN EFFECTIVE CONCENTRATION FOR MANY PATIENTS. HOWEVER, SOME ARE BEST TREATED AT CONCENTRATIONS OUTSIDE THIS RANGE. ACETAMINOPHEN CONCENTRATIONS >150 ug/mL AT 4 HOURS AFTER INGESTION AND >50 ug/mL AT 12 HOURS AFTER INGESTION ARE OFTEN ASSOCIATED WITH TOXIC REACTIONS.   cbc     Status: Abnormal   Collection Time: 04/11/17 12:56 PM   Result Value Ref Range   WBC 9.8 4.0 - 10.5 K/uL   RBC 4.85 3.87 - 5.11 MIL/uL   Hemoglobin 15.2 (H) 12.0 - 15.0 g/dL   HCT 44.1 36.0 - 46.0 %   MCV 90.9 78.0 - 100.0 fL   MCH 31.3 26.0 - 34.0 pg   MCHC 34.5 30.0 - 36.0 g/dL   RDW 13.5 11.5 - 15.5 %   Platelets 341 150 - 400 K/uL  Rapid urine drug screen (hospital performed)     Status: Abnormal   Collection Time: 04/12/17  2:50 AM  Result Value Ref Range   Opiates POSITIVE (A) NONE DETECTED   Cocaine NONE DETECTED NONE DETECTED   Benzodiazepines POSITIVE (A) NONE DETECTED   Amphetamines NONE DETECTED NONE DETECTED   Tetrahydrocannabinol NONE DETECTED NONE DETECTED   Barbiturates NONE DETECTED NONE DETECTED    Comment:        DRUG SCREEN FOR MEDICAL PURPOSES ONLY.  IF CONFIRMATION IS NEEDED FOR ANY PURPOSE, NOTIFY LAB WITHIN 5 DAYS.        LOWEST DETECTABLE LIMITS FOR URINE DRUG SCREEN Drug Class       Cutoff (ng/mL) Amphetamine      1000 Barbiturate      200 Benzodiazepine   888 Tricyclics       280 Opiates          300 Cocaine          300 THC              50     Current Facility-Administered Medications  Medication Dose Route Frequency Provider Last Rate Last Dose  . acetaminophen (TYLENOL) tablet 650 mg  650 mg Oral Q6H PRN Charlesetta Shanks, MD      . diclofenac (VOLTAREN) EC tablet 50 mg  50 mg Oral Daily Charlesetta Shanks, MD   50 mg at 04/12/17 0912  . escitalopram (LEXAPRO) tablet 10 mg  10 mg Oral Daily Francie Keeling, MD      . levothyroxine (SYNTHROID, LEVOTHROID) tablet 75 mcg  75 mcg Oral QAC breakfast Charlesetta Shanks, MD   75 mcg at 04/12/17 0901  . lidocaine (LIDODERM) 5 % 1 patch  1 patch Transdermal Q24H Charlesetta Shanks, MD   1 patch at 04/11/17 2050  . lisinopril (PRINIVIL,ZESTRIL) tablet 40 mg  40 mg Oral Daily Charlesetta Shanks, MD   40 mg at 04/12/17 0912  . multivitamin with minerals tablet 1 tablet  1 tablet Oral Daily Charlesetta Shanks, MD   1 tablet at 04/12/17 0912  . pantoprazole (PROTONIX) EC  tablet 40 mg  40 mg Oral Daily Charlesetta Shanks, MD   40 mg at 04/12/17 0912  . QUEtiapine (SEROQUEL) tablet 50 mg  50 mg Oral QHS Corena Pilgrim, MD       Current Outpatient Prescriptions  Medication Sig Dispense Refill  . acetaminophen (TYLENOL) 325 MG tablet Take 650 mg by mouth every 6 (six) hours as needed for mild pain.     Marland Kitchen acetaminophen (TYLENOL) 500 MG tablet Take 1,000 mg by mouth every 6 (six) hours as needed for moderate pain.    Marland Kitchen acetaminophen-codeine (TYLENOL #3) 300-30 MG tablet Take 1 tablet by mouth every 8 (eight) hours as needed for moderate pain or severe pain.     Marland Kitchen ALPRAZolam (XANAX) 0.25 MG tablet Take 0.25 mg by mouth 3 (three) times daily as needed for anxiety or sleep.    Marland Kitchen diclofenac (VOLTAREN) 50 MG EC tablet Take 50 mg by mouth daily.     . Iron-FA-B Cmp-C-Biot-Probiotic (FUSION PLUS PO) Take 1 capsule by mouth daily.     Marland Kitchen levothyroxine (SYNTHROID, LEVOTHROID) 75 MCG tablet Take 75 mcg by mouth every morning.    . lidocaine (LIDODERM) 5 % Place 1 patch onto the skin daily. Remove & Discard patch within 12 hours or as directed by MD 30 patch 0  . lisinopril (PRINIVIL,ZESTRIL) 40 MG tablet Take 40 mg by mouth every morning.     . mirtazapine (REMERON) 15 MG tablet Take 15 mg by mouth at bedtime.     Marland Kitchen omeprazole (PRILOSEC) 20 MG capsule TAKE ONE CAPSULE BY MOUTH TWICE DAILY. 60 capsule 2  . oxyCODONE-acetaminophen (PERCOCET) 5-325 MG tablet Take 0.5-1 tablets by mouth every 6 (six) hours as needed for severe pain. 20 tablet 0  . QUEtiapine (SEROQUEL) 200 MG tablet Take 200 mg by mouth at bedtime.    . methocarbamol (ROBAXIN) 500 MG tablet Take 1 tablet (500 mg total) by mouth every 6 (six) hours as needed for muscle spasms. (Patient not taking: Reported on 04/11/2017) 30 tablet 0  . predniSONE (DELTASONE) 20 MG tablet 1 tab daily for 5 days (Patient not taking: Reported on 04/11/2017) 11 tablet 0    Musculoskeletal: Strength & Muscle Tone: decreased Gait & Station:  unsteady Patient leans: Front  Psychiatric Specialty Exam: Physical Exam  Psychiatric: Her speech is normal. Judgment normal. Her mood appears anxious. She is slowed and withdrawn. Cognition and memory are impaired. She exhibits a depressed mood. She expresses suicidal ideation.    Review of Systems  Constitutional: Positive for malaise/fatigue.  HENT: Negative.   Eyes: Negative.   Respiratory: Negative.   Cardiovascular: Negative.   Gastrointestinal: Negative.   Musculoskeletal: Positive for myalgias.  Skin: Negative.   Neurological: Negative.   Endo/Heme/Allergies: Negative.   Psychiatric/Behavioral: Positive for depression and suicidal ideas. The patient is nervous/anxious and has insomnia.     Blood pressure 139/65, pulse 83, temperature 98 F (36.7 C), temperature source Axillary, resp. rate 18, SpO2 92 %.There is no height or weight on file to calculate BMI.  General Appearance: Casual  Eye Contact:  Good  Speech:  Clear and Coherent  Volume:  Normal  Mood:  Anxious and Depressed  Affect:  Constricted  Thought Process:  Coherent  Orientation:  Other:  only to person and place  Thought Content:  Logical  Suicidal Thoughts:  Yes.  with intent/plan  Homicidal Thoughts:  No  Memory:  Immediate;   Good Recent;   Poor Remote;   Poor  Judgement:  Impaired  Insight:  Shallow  Psychomotor Activity:  Decreased  Concentration:  Concentration: Fair and Attention Span: Fair  Recall:  [employer] of Knowledge:  Fair  Language:  Good  Akathisia:  No  Handed:  Right  AIMS (if indicated):     Assets:  [employer]ed forces logistics/support/administrative officer Social Support  ADL's:  Impaired  Cognition:  Impaired,  Moderate  Sleep:   poor     Treatment Plan Summary: Daily contact with patient to assess and evaluate symptoms and progress in treatment and Medication management  Start Seroquel 50 mg qhs for insomnia/mood and Lexapro 10 mg daily for depression.  Disposition: Recommend psychiatric Inpatient  admission when medically cleared.  Corena Pilgrim, MD 04/12/2017 12:23 PM

## 2017-04-13 DIAGNOSIS — F331 Major depressive disorder, recurrent, moderate: Secondary | ICD-10-CM | POA: Diagnosis not present

## 2017-04-13 DIAGNOSIS — F0280 Dementia in other diseases classified elsewhere without behavioral disturbance: Secondary | ICD-10-CM | POA: Diagnosis not present

## 2017-04-13 DIAGNOSIS — R45851 Suicidal ideations: Secondary | ICD-10-CM | POA: Diagnosis not present

## 2017-04-13 NOTE — BH Assessment (Addendum)
[place] Assessment Progress Note Pt declined by [place] by [facility]-too medically acute.  Pt to be medically admitted.

## 2017-04-13 NOTE — Consult Note (Signed)
[place] [facility]iatry Consult   Reason for Consult:  Suicidal thoughts, disorganized behavior Referring Physician:  EDP Patient Identification: GWENDY BOEDER MRN:  [id number] Principal Diagnosis: Major depression, recurrent ([employer]) Diagnosis:   Patient Active Problem List   Diagnosis Date Noted  . Major depression, recurrent ([place]) [F33.9] 04/12/2017    Priority: High  . Dementia without behavioral disturbance [F03.90] 03/30/2017    Priority: High  . Back pain [M54.9] 03/30/2017  . Constipation [K59.00] 08/18/2015  . Mass, ovarian [N83.9] 11/24/2013  . Esophagitis due to drug [K20.8, T50.904A] 10/19/2013  . Colon cancer screening [Z12.11] 10/19/2013  . Bradycardia [R00.1] 06/05/2012  . Fracture of patella, left, closed [S82.002A] 04/01/2012  . Fractured pelvis ([place]) [S32.9XXA] 11/19/2011  . Celiac disease [K90.0] 11/13/2011  . Weakness generalized [R53.1] 09/27/2011  . Hyponatremia [E87.1] 09/27/2011  . Psychogenic polydipsia [R63.1, F54] 09/27/2011  . Dehydration [E86.0] 09/27/2011  . Anemia [D64.9] 09/21/2011  . Fall [W19.XXXA] 09/21/2011  . Pelvic fracture ([facility]) [S32.9XXA] 09/21/2011  . Emphysema [492] 09/21/2011  . Dysphagia [R13.10] 09/21/2011  . Chest pain [786.5] 09/21/2011  . SIADH (syndrome of inappropriate ADH production) ([place]) [E22.2] 09/21/2011  . Hematoma [T14.8XXA] 09/21/2011  . GERD (gastroesophageal reflux disease) [K21.9] 09/21/2011  . Hypertension [I10] 09/20/2011  . Hypothyroidism [E03.9] 09/20/2011  . Depression [F32.9] 09/20/2011  . Headache(784.0) [R51] 09/20/2011    Total Time spent with patient: 30 minutes  Subjective:  ''I am still feeling depressed.''  Objective:  Patient was seen and interviewed, chart reviewed and case discussed with treatment plan. Patient reports ongoing depression and suicidal thoughts with plan to overdose.  She insist that she no longer wants to be a burden to her family. Patient denies delusional thinking or  psychosis.  Past Psychiatric History: as above  Risk to Self: Suicidal Ideation: Yes-Currently Present Suicidal Intent: Yes-Currently Present Is patient at risk for suicide?: No Suicidal Plan?: Yes-Currently Present Specify Current Suicidal Plan: Overdose Access to Means: No What has been your use of drugs/alcohol within the last 12 months?: Denies How many times?:  (NA) Other Self Harm Risks:  (NA) Triggers for Past Attempts: Unknown Intentional Self Injurious Behavior: None Risk to Others: Homicidal Ideation: No Thoughts of Harm to Others: No Current Homicidal Intent: No Current Homicidal Plan: No Access to Homicidal Means: No Identified Victim: NA History of harm to others?: No Assessment of Violence: None Noted Violent Behavior Description: NA Does patient have access to weapons?: No Criminal Charges Pending?: No Does patient have a court date: No Prior Inpatient Therapy: Prior Inpatient Therapy: No Prior Therapy Dates: NA (NA) Prior Therapy Facilty/Provider(s):  (NA) Reason for Treatment: NA Prior Outpatient Therapy: Prior Outpatient Therapy: No Prior Therapy Dates: NA Prior Therapy Facilty/Provider(s): NA Reason for Treatment: NA Does patient have an ACCT team?: No Does patient have Intensive In-House Services?  : No Does patient have [facility] services? : No Does patient have P4CC services?: No  Past Medical History:  Past Medical History:  Diagnosis Date  . Anxiety   . Celiac disease   . Chest pain 09/21/2011   OCCASIONAL  . Constipation   . Dementia   . Difficulty sleeping    TAKES MED TO SLEEP  . Dysphagia 09/21/2011  . Dysrhythmia    "heart arrthymia" per son, but unsure what kind  . GERD (gastroesophageal reflux disease)   . HTN (hypertension)   . Hypothyroidism   . Knee fracture, left 03/2012  . Osteopenia   . Ovarian mass   . 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  Schizophrenia ([place])    FAMILY UNSURE OF THIS    Past Surgical History:   Procedure Laterality Date  . ESOPHAGOGASTRODUODENOSCOPY  09/27/2011   Inflammatory changes midesophagus most likely pill-induced injury (Fosamax), large diaphragmatic hernia, biopsies from small bowel consistent with celiac disease  . ESOPHAGOGASTRODUODENOSCOPY (EGD) WITH ESOPHAGEAL DILATION N/A 07/13/2013   VZC:HYIFOY esophagitis-more likely pill-induced s/p esophageal biopsy. Noncritical. Schatzki's ring s/p passage of a Maloney dilator. Hiatal hernia. Abnormal duodenum consistent with prior diagnosis of celiac disease. Status post 13 French Maloney dilator. Esophageal biopsies benign showed marked acute and chronic inflammation.  Marland Kitchen LAPAROSCOPIC SALPINGO OOPHERECTOMY Bilateral 11/16/2013   Procedure: LAPAROSCOPIC BILATERAL SALPINGO OOPHORECTOMY;  Surgeon: Imagene Gurney A. Alycia Rossetti, MD;  Location: [facility];  Service: Gynecology;  Laterality: Bilateral;  . left hip replacement    . left shoulder    . THYROIDECTOMY    . VERTEBROPLASTY     Family History:  Family History  Problem Relation Age of Onset  . Cancer Sister     brain tumor  . Hypertension Daughter   . Kidney disease Sister   . Hypertension Brother    Family Psychiatric  History:  Social History:  History  Alcohol Use No    Comment: rarely     History  Drug Use No    Social History   Social History  . Marital status: Divorced    Spouse name: N/A  . Number of children: N/A  . Years of education: 12th   Occupational History  . Retired Retired   Social History Main Topics  . Smoking status: Never Smoker  . Smokeless tobacco: Never Used  . Alcohol use No     Comment: rarely  . Drug use: No  . Sexual activity: Not Currently    Birth control/ protection: Post-menopausal   Other Topics Concern  . None   Social History Narrative  . None   Additional Social History:    Allergies:   Allergies  Allergen Reactions  . Gluten     Celiac disease    Labs:  Results for orders placed or performed during the hospital  encounter of 04/11/17 (from the past 48 hour(s))  Comprehensive metabolic panel     Status: Abnormal   Collection Time: 04/11/17 12:56 PM  Result Value Ref Range   Sodium 136 135 - 145 mmol/L   Potassium 3.5 3.5 - 5.1 mmol/L   Chloride 100 (L) 101 - 111 mmol/L   CO2 22 22 - 32 mmol/L   Glucose, Bld 103 (H) 65 - 99 mg/dL   BUN 20 6 - 20 mg/dL   Creatinine, Ser 0.87 0.44 - 1.00 mg/dL   Calcium 9.5 8.9 - 10.3 mg/dL   Total Protein 6.7 6.5 - 8.1 g/dL   Albumin 3.6 3.5 - 5.0 g/dL   AST 30 15 - 41 U/L   ALT 20 14 - 54 U/L   Alkaline Phosphatase 67 38 - 126 U/L   Total Bilirubin 0.7 0.3 - 1.2 mg/dL   GFR calc non Af Amer 59 (L) >60 mL/min   GFR calc Af Amer >60 >60 mL/min    Comment: (NOTE) The eGFR has been calculated using the CKD EPI equation. This calculation has not been validated in all clinical situations. eGFR's persistently <60 mL/min signify possible Chronic Kidney Disease.    Anion gap 14 5 - 15  Ethanol     Status: None   Collection Time: 04/11/17 12:56 PM  Result Value Ref Range   Alcohol, Ethyl (B) <5 <  5 mg/dL    Comment:        LOWEST DETECTABLE LIMIT FOR SERUM ALCOHOL IS 5 mg/dL FOR MEDICAL PURPOSES ONLY   Salicylate level     Status: None   Collection Time: 04/11/17 12:56 PM  Result Value Ref Range   Salicylate Lvl <3.9 2.8 - 30.0 mg/dL  Acetaminophen level     Status: None   Collection Time: 04/11/17 12:56 PM  Result Value Ref Range   Acetaminophen (Tylenol), Serum 17 10 - 30 ug/mL    Comment:        THERAPEUTIC CONCENTRATIONS VARY SIGNIFICANTLY. A RANGE OF 10-30 ug/mL MAY BE AN EFFECTIVE CONCENTRATION FOR MANY PATIENTS. HOWEVER, SOME ARE BEST TREATED AT CONCENTRATIONS OUTSIDE THIS RANGE. ACETAMINOPHEN CONCENTRATIONS >150 ug/mL AT 4 HOURS AFTER INGESTION AND >50 ug/mL AT 12 HOURS AFTER INGESTION ARE OFTEN ASSOCIATED WITH TOXIC REACTIONS.   cbc     Status: Abnormal   Collection Time: 04/11/17 12:56 PM  Result Value Ref Range   WBC 9.8 4.0 - 10.5  K/uL   RBC 4.85 3.87 - 5.11 MIL/uL   Hemoglobin 15.2 (H) 12.0 - 15.0 g/dL   HCT 44.1 36.0 - 46.0 %   MCV 90.9 78.0 - 100.0 fL   MCH 31.3 26.0 - 34.0 pg   MCHC 34.5 30.0 - 36.0 g/dL   RDW 13.5 11.5 - 15.5 %   Platelets 341 150 - 400 K/uL  Rapid urine drug screen (hospital performed)     Status: Abnormal   Collection Time: 04/12/17  2:50 AM  Result Value Ref Range   Opiates POSITIVE (A) NONE DETECTED   Cocaine NONE DETECTED NONE DETECTED   Benzodiazepines POSITIVE (A) NONE DETECTED   Amphetamines NONE DETECTED NONE DETECTED   Tetrahydrocannabinol NONE DETECTED NONE DETECTED   Barbiturates NONE DETECTED NONE DETECTED    Comment:        DRUG SCREEN FOR MEDICAL PURPOSES ONLY.  IF CONFIRMATION IS NEEDED FOR ANY PURPOSE, NOTIFY LAB WITHIN 5 DAYS.        LOWEST DETECTABLE LIMITS FOR URINE DRUG SCREEN Drug Class       Cutoff (ng/mL) Amphetamine      1000 Barbiturate      200 Benzodiazepine   767 Tricyclics       341 Opiates          300 Cocaine          300 THC              50     Current Facility-Administered Medications  Medication Dose Route Frequency Provider Last Rate Last Dose  . acetaminophen (TYLENOL) tablet 650 mg  650 mg Oral Q6H PRN Charlesetta Shanks, MD      . diclofenac (VOLTAREN) EC tablet 50 mg  50 mg Oral Daily Charlesetta Shanks, MD   50 mg at 04/12/17 0912  . escitalopram (LEXAPRO) tablet 10 mg  10 mg Oral Daily Frederika Hukill, MD   10 mg at 04/13/17 0928  . levothyroxine (SYNTHROID, LEVOTHROID) tablet 75 mcg  75 mcg Oral QAC breakfast Charlesetta Shanks, MD   75 mcg at 04/13/17 0828  . lidocaine (LIDODERM) 5 % 1 patch  1 patch Transdermal Q24H Charlesetta Shanks, MD   1 patch at 04/12/17 1850  . lisinopril (PRINIVIL,ZESTRIL) tablet 40 mg  40 mg Oral Daily Charlesetta Shanks, MD   40 mg at 04/12/17 0912  . multivitamin with minerals tablet 1 tablet  1 tablet Oral Daily Charlesetta Shanks, MD   1 tablet  at 04/13/17 0928  . pantoprazole (PROTONIX) EC tablet 40 mg  40 mg Oral  Daily Charlesetta Shanks, MD   40 mg at 04/13/17 0928  . QUEtiapine (SEROQUEL) tablet 50 mg  50 mg Oral QHS Corena Pilgrim, MD   50 mg at 04/12/17 2154   Current Outpatient Prescriptions  Medication Sig Dispense Refill  . acetaminophen (TYLENOL) 325 MG tablet Take 650 mg by mouth every 6 (six) hours as needed for mild pain.     Marland Kitchen acetaminophen (TYLENOL) 500 MG tablet Take 1,000 mg by mouth every 6 (six) hours as needed for moderate pain.    Marland Kitchen acetaminophen-codeine (TYLENOL #3) 300-30 MG tablet Take 1 tablet by mouth every 8 (eight) hours as needed for moderate pain or severe pain.     Marland Kitchen ALPRAZolam (XANAX) 0.25 MG tablet Take 0.25 mg by mouth 3 (three) times daily as needed for anxiety or sleep.    Marland Kitchen diclofenac (VOLTAREN) 50 MG EC tablet Take 50 mg by mouth daily.     . Iron-FA-B Cmp-C-Biot-Probiotic (FUSION PLUS PO) Take 1 capsule by mouth daily.     Marland Kitchen levothyroxine (SYNTHROID, LEVOTHROID) 75 MCG tablet Take 75 mcg by mouth every morning.    . lidocaine (LIDODERM) 5 % Place 1 patch onto the skin daily. Remove & Discard patch within 12 hours or as directed by MD 30 patch 0  . lisinopril (PRINIVIL,ZESTRIL) 40 MG tablet Take 40 mg by mouth every morning.     . mirtazapine (REMERON) 15 MG tablet Take 15 mg by mouth at bedtime.     Marland Kitchen omeprazole (PRILOSEC) 20 MG capsule TAKE ONE CAPSULE BY MOUTH TWICE DAILY. 60 capsule 2  . oxyCODONE-acetaminophen (PERCOCET) 5-325 MG tablet Take 0.5-1 tablets by mouth every 6 (six) hours as needed for severe pain. 20 tablet 0  . QUEtiapine (SEROQUEL) 200 MG tablet Take 200 mg by mouth at bedtime.    . methocarbamol (ROBAXIN) 500 MG tablet Take 1 tablet (500 mg total) by mouth every 6 (six) hours as needed for muscle spasms. (Patient not taking: Reported on 04/11/2017) 30 tablet 0  . predniSONE (DELTASONE) 20 MG tablet 1 tab daily for 5 days (Patient not taking: Reported on 04/11/2017) 11 tablet 0    Musculoskeletal: Strength & Muscle Tone: decreased Gait & Station:  unsteady Patient leans: Front  Psychiatric Specialty Exam: Physical Exam  Psychiatric: Her speech is normal. Judgment normal. Her mood appears anxious. She is slowed and withdrawn. Cognition and memory are impaired. She exhibits a depressed mood. She expresses suicidal ideation.    Review of Systems  Constitutional: Positive for malaise/fatigue.  HENT: Negative.   Eyes: Negative.   Respiratory: Negative.   Cardiovascular: Negative.   Gastrointestinal: Negative.   Musculoskeletal: Positive for myalgias.  Skin: Negative.   Neurological: Negative.   Endo/Heme/Allergies: Negative.   Psychiatric/Behavioral: Positive for depression and suicidal ideas. The patient is nervous/anxious and has insomnia.     Blood pressure 131/66, pulse 89, temperature 98.1 F (36.7 C), temperature source Oral, resp. rate 18, SpO2 96 %.There is no height or weight on file to calculate BMI.  General Appearance: Casual  Eye Contact:  Good  Speech:  Clear and Coherent  Volume:  Normal  Mood:  Anxious and Depressed  Affect:  Constricted  Thought Process:  Coherent  Orientation:  Other:  only to person and place  Thought Content:  Logical  Suicidal Thoughts:  Yes.  with intent/plan  Homicidal Thoughts:  No  Memory:  Immediate;  Good Recent;   Poor Remote;   Poor  Judgement:  Impaired  Insight:  Shallow  Psychomotor Activity:  Decreased  Concentration:  Concentration: Fair and Attention Span: Fair  Recall:  [employer] of Knowledge:  Fair  Language:  Good  Akathisia:  No  Handed:  Right  AIMS (if indicated):     Assets:  Armed forces logistics/support/administrative officer Social Support  ADL's:  Impaired  Cognition:  Impaired,  Moderate  Sleep:   poor     Treatment Plan Summary: Daily contact with patient to assess and evaluate symptoms and progress in treatment and Medication management  Continue  Seroquel 50 mg qhs for insomnia/mood. Continue Lexapro 10 mg daily for depression.  Disposition: Recommend psychiatric  Inpatient admission when medically cleared.  Awaiting Geropsychiatric inpatient placement.  Corena Pilgrim, MD 04/13/2017 11:30 AM

## 2017-04-13 NOTE — ED Notes (Signed)
Pt has a visitor at bedside that has identified himself as son.

## 2017-04-13 NOTE — BH Assessment (Signed)
[place] [place] Assessment Progress Note Felicia Harvey from [facility] states that they are reviewing her chest CT when the medical doctor comes in, and if approved, they will accept pt

## 2017-04-14 ENCOUNTER — Emergency Department (HOSPITAL_COMMUNITY)
Admission: EM | Admit: 2017-04-14 | Discharge: 2017-04-14 | Disposition: A | Discharge status: Home / Self Care | Payer: Medicare Other | Attending: Emergency Medicine | Admitting: Emergency Medicine

## 2017-04-14 DIAGNOSIS — Y929 Unspecified place or not applicable: Secondary | ICD-10-CM | POA: Diagnosis not present

## 2017-04-14 DIAGNOSIS — I10 Essential (primary) hypertension: Secondary | ICD-10-CM | POA: Insufficient documentation

## 2017-04-14 DIAGNOSIS — M5489 Other dorsalgia: Secondary | ICD-10-CM | POA: Diagnosis not present

## 2017-04-14 DIAGNOSIS — Y999 Unspecified external cause status: Secondary | ICD-10-CM | POA: Insufficient documentation

## 2017-04-14 DIAGNOSIS — S22008A Other fracture of unspecified thoracic vertebra, initial encounter for closed fracture: Secondary | ICD-10-CM | POA: Diagnosis not present

## 2017-04-14 DIAGNOSIS — E039 Hypothyroidism, unspecified: Secondary | ICD-10-CM | POA: Diagnosis not present

## 2017-04-14 DIAGNOSIS — Y939 Activity, unspecified: Secondary | ICD-10-CM | POA: Insufficient documentation

## 2017-04-14 DIAGNOSIS — R404 Transient alteration of awareness: Secondary | ICD-10-CM | POA: Diagnosis not present

## 2017-04-14 DIAGNOSIS — S22000A Wedge compression fracture of unspecified thoracic vertebra, initial encounter for closed fracture: Secondary | ICD-10-CM | POA: Diagnosis not present

## 2017-04-14 DIAGNOSIS — R6889 Other general symptoms and signs: Secondary | ICD-10-CM | POA: Diagnosis not present

## 2017-04-14 DIAGNOSIS — R45851 Suicidal ideations: Secondary | ICD-10-CM | POA: Diagnosis not present

## 2017-04-14 DIAGNOSIS — X58XXXA Exposure to other specified factors, initial encounter: Secondary | ICD-10-CM | POA: Insufficient documentation

## 2017-04-14 DIAGNOSIS — Z79899 Other long term (current) drug therapy: Secondary | ICD-10-CM | POA: Diagnosis not present

## 2017-04-14 DIAGNOSIS — F331 Major depressive disorder, recurrent, moderate: Secondary | ICD-10-CM | POA: Diagnosis not present

## 2017-04-14 DIAGNOSIS — F0280 Dementia in other diseases classified elsewhere without behavioral disturbance: Secondary | ICD-10-CM | POA: Diagnosis not present

## 2017-04-14 MED ORDER — ESCITALOPRAM OXALATE 10 MG PO TABS: 10.0000 mg | ORAL_TABLET | Freq: Every day | ORAL | 0 refills | Status: AC

## 2017-04-14 MED ORDER — QUETIAPINE FUMARATE 50 MG PO TABS: 50.0000 mg | ORAL_TABLET | Freq: Every day | ORAL | 0 refills | Status: AC

## 2017-04-14 MED ORDER — OXYCODONE-ACETAMINOPHEN 5-325 MG PO TABS: 1.0000 | ORAL_TABLET | Freq: Once | ORAL | Status: DC

## 2017-04-14 MED ORDER — ACETAMINOPHEN 325 MG PO TABS
650.0000 mg | ORAL_TABLET | Freq: Three times a day (TID) | ORAL | Status: DC
  Administered 2017-04-14: 650 mg via ORAL
  Filled 2017-04-14: qty 2

## 2017-04-14 MED ORDER — OXYCODONE-ACETAMINOPHEN 5-325 MG PO TABS: 1.0000 | ORAL_TABLET | ORAL | 0 refills | Status: AC | PRN

## 2017-04-14 NOTE — ED Provider Notes (Signed)
Dg Chest 2 View Result Date: 04/12/2017 CLINICAL DATA:  83 year old female with suicidal ideation. EXAM: CHEST  2 VIEW COMPARISON:  Chest CTA 04/02/2017 and earlier. Thoracic spine MRI 03/31/2017 FINDINGS: Semi upright AP and lateral views of the chest. Progressed and severe thoracic kyphosis related to 2 level severe mid thoracic compression fractures at T8 and T9. The T9 fracture appears new since the recent MRI. Previously augmented L1 compression fracture. Osteopenia. Lower lung volumes with suspected small bilateral pleural effusions. Stable cardiomegaly and mediastinal contours. No pneumothorax or pulmonary edema. No consolidation. Stable visible bowel gas pattern. IMPRESSION: 1. Acute severe T9 compression fracture, new from the recent chest CTA and thoracic spine MRIs. 2. Superimposed subacute to chronic T8 compression fracture, and subsequent progressed and now severe thoracic kyphosis. 3. Small bilateral pleural effusions.    Mr Thoracic Spine W Wo Contrast Result Date: 03/31/2017  IMPRESSION: 1. No acute osseous abnormality is identified. Late-subacute to early-chronic appearing T8 compression fracture. Chronic thoracic and lumbar compression fractures elsewhere, including previously augmented L1 fracture. 2. No thoracic spinal stenosis. Age concordant thoracic spine degeneration. 3. Small layering bilateral pleural effusions.    Ct Angio Chest/abd/pel For Dissection W And/or Wo Contrast Result Date: 04/02/2017 CLINICAL DATA:  Back pain for 3 days.  Evaluate for dissection. EXAM: CT ANGIOGRAPHY CHEST, ABDOMEN AND PELVIS TECHNIQUE:  IMPRESSION: No evidence of aortic aneurysm or dissection. Aortic atherosclerosis. Mild cardiomegaly. Small bilateral pleural effusions. Compressive/ dependent atelectasis in the lower lobes. Elongated gallbladder extends into the pelvis. Scattered gallbladder wall calcifications compatible with early porcelain gallbladder. Moderate stool burden throughout the colon. No  acute findings in the chest, abdomen or pelvis.     Assessment/Plan Small bilateral pleural effusions  Stable since CT angio on 04/02/2017. No hypoxia or increased work of  breathing. No indication for additional acute workup at this time. Incentive  spirometry added  Acute T9 Compression Fracture  Minimal pain at this , will treat with tylenol  Moves all 4 extremities equally  No indication for additional management at this time  No neuro deficits. Worsening kyphosis  Depression/SI  Per psychiatry recommendations  Currently seeking active placement         Jola Schmidt, MD 04/14/17 [phone]

## 2017-04-14 NOTE — Consult Note (Addendum)
[facility] Consult   Reason for Consult:  depression Referring Physician:  EDP Patient Identification: Felicia Harvey MRN:  [id number] Principal Diagnosis: Major depression, recurrent ([place]) Diagnosis:   Patient Active Problem List   Diagnosis Date Noted  . Major depression, recurrent ([place]) [F33.9] 04/12/2017    Priority: High  . Dementia without behavioral disturbance [F03.90] 03/30/2017    Priority: High  . Back pain [M54.9] 03/30/2017  . Constipation [K59.00] 08/18/2015  . Mass, ovarian [N83.9] 11/24/2013  . Esophagitis due to drug [K20.8, T50.904A] 10/19/2013  . Colon cancer screening [Z12.11] 10/19/2013  . Bradycardia [R00.1] 06/05/2012  . Fracture of patella, left, closed [S82.002A] 04/01/2012  . Fractured pelvis ([place]) [S32.9XXA] 11/19/2011  . Celiac disease [K90.0] 11/13/2011  . Weakness generalized [R53.1] 09/27/2011  . Hyponatremia [E87.1] 09/27/2011  . Psychogenic polydipsia [R63.1, F54] 09/27/2011  . Dehydration [E86.0] 09/27/2011  . Anemia [D64.9] 09/21/2011  . Fall [W19.XXXA] 09/21/2011  . Pelvic fracture (HCC) [S32.9XXA] 09/21/2011  . Emphysema [492] 09/21/2011  . Dysphagia [R13.10] 09/21/2011  . Chest pain [786.5] 09/21/2011  . SIADH (syndrome of inappropriate ADH production) ([place]) [E22.2] 09/21/2011  . Hematoma [T14.8XXA] 09/21/2011  . GERD (gastroesophageal reflux disease) [K21.9] 09/21/2011  . Hypertension [I10] 09/20/2011  . Hypothyroidism [E03.9] 09/20/2011  . Depression [F32.9] 09/20/2011  . Headache(784.0) [R51] 09/20/2011    Total Time spent with patient: 20 minutes  Subjective:  ''I am feeling good today, I am ready to go home.''  Objective:  Patient was seen and interviewed, chart reviewed and case discussed with treatment team. Patient reports doing really fine to day, says she slept well last night. Today, she denies psychosis, delusional thinking, suicidal ideation, intent or plan Past Psychiatric History: as above  Risk to  Self: Suicidal Ideation: Denies  Suicidal Intent: denies Is patient at risk for suicide?: No Suicidal Plan?: denies Specify Current Suicidal Plan: Overdose Access to Means: No What has been your use of drugs/alcohol within the last 12 months?: Denies How many times?:  (NA) Other Self Harm Risks:  (NA) Triggers for Past Attempts: Unknown Intentional Self Injurious Behavior: None Risk to Others: Homicidal Ideation: No Thoughts of Harm to Others: No Current Homicidal Intent: No Current Homicidal Plan: No Access to Homicidal Means: No Identified Victim: NA History of harm to others?: No Assessment of Violence: None Noted Violent Behavior Description: NA Does patient have access to weapons?: No Criminal Charges Pending?: No Does patient have a court date: No Prior Inpatient Therapy: Prior Inpatient Therapy: No Prior Therapy Dates: NA (NA) Prior Therapy Facilty/Provider(s):  (NA) Reason for Treatment: NA Prior Outpatient Therapy: Prior Outpatient Therapy: No Prior Therapy Dates: NA Prior Therapy Facilty/Provider(s): NA Reason for Treatment: NA Does patient have an ACCT team?: No Does patient have Intensive In-House Services?  : No Does patient have [facility] services? : No Does patient have P4CC services?: No  Past Medical History:  Past Medical History:  Diagnosis Date  . Anxiety   . Celiac disease   . Chest pain 09/21/2011   OCCASIONAL  . Constipation   . Dementia   . Difficulty sleeping    TAKES MED TO SLEEP  . Dysphagia 09/21/2011  . Dysrhythmia    "heart arrthymia" per son, but unsure what kind  . GERD (gastroesophageal reflux disease)   . HTN (hypertension)   . Hypothyroidism   . Knee fracture, left 03/2012  . Osteopenia   . Ovarian mass   . 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 Schizophrenia ([place])    FAMILY  UNSURE OF THIS    Past Surgical History:  Procedure Laterality Date  . ESOPHAGOGASTRODUODENOSCOPY  09/27/2011   Inflammatory changes midesophagus most  likely pill-induced injury (Fosamax), large diaphragmatic hernia, biopsies from small bowel consistent with celiac disease  . ESOPHAGOGASTRODUODENOSCOPY (EGD) WITH ESOPHAGEAL DILATION N/A 07/13/2013   WUJ:WJXBJY esophagitis-more likely pill-induced s/p esophageal biopsy. Noncritical. Schatzki's ring s/p passage of a Maloney dilator. Hiatal hernia. Abnormal duodenum consistent with prior diagnosis of celiac disease. Status post 70 French Maloney dilator. Esophageal biopsies benign showed marked acute and chronic inflammation.  Marland Kitchen LAPAROSCOPIC SALPINGO OOPHERECTOMY Bilateral 11/16/2013   Procedure: LAPAROSCOPIC BILATERAL SALPINGO OOPHORECTOMY;  Surgeon: Imagene Gurney A. Alycia Rossetti, MD;  Location: [facility];  Service: Gynecology;  Laterality: Bilateral;  . left hip replacement    . left shoulder    . THYROIDECTOMY    . VERTEBROPLASTY     Family History:  Family History  Problem Relation Age of Onset  . Cancer Sister     brain tumor  . Hypertension Daughter   . Kidney disease Sister   . Hypertension Brother    Family Psychiatric  History:  Social History:  History  Alcohol Use No    Comment: rarely     History  Drug Use No    Social History   Social History  . Marital status: Divorced    Spouse name: N/A  . Number of children: N/A  . Years of education: 12th   Occupational History  . Retired Retired   Social History Main Topics  . Smoking status: Never Smoker  . Smokeless tobacco: Never Used  . Alcohol use No     Comment: rarely  . Drug use: No  . Sexual activity: Not Currently    Birth control/ protection: Post-menopausal   Other Topics Concern  . None   Social History Narrative  . None   Additional Social History:    Allergies:   Allergies  Allergen Reactions  . Gluten     Celiac disease    Labs:  No results found for this or any previous visit (from the past 48 hour(s)).  Current Facility-Administered Medications  Medication Dose Route Frequency Provider Last Rate Last  Dose  . acetaminophen (TYLENOL) tablet 650 mg  650 mg Oral Teena Dunk, MD   650 mg at 04/14/17 0935  . diclofenac (VOLTAREN) EC tablet 50 mg  50 mg Oral Daily Charlesetta Shanks, MD   50 mg at 04/14/17 0935  . escitalopram (LEXAPRO) tablet 10 mg  10 mg Oral Daily Zachry Hopfensperger, MD   10 mg at 04/14/17 0935  . levothyroxine (SYNTHROID, LEVOTHROID) tablet 75 mcg  75 mcg Oral QAC breakfast Charlesetta Shanks, MD   75 mcg at 04/14/17 0808  . lidocaine (LIDODERM) 5 % 1 patch  1 patch Transdermal Q24H Charlesetta Shanks, MD   1 patch at 04/13/17 2014  . lisinopril (PRINIVIL,ZESTRIL) tablet 40 mg  40 mg Oral Daily Charlesetta Shanks, MD   40 mg at 04/14/17 0935  . multivitamin with minerals tablet 1 tablet  1 tablet Oral Daily Charlesetta Shanks, MD   1 tablet at 04/14/17 0935  . pantoprazole (PROTONIX) EC tablet 40 mg  40 mg Oral Daily Charlesetta Shanks, MD   40 mg at 04/14/17 0935  . QUEtiapine (SEROQUEL) tablet 50 mg  50 mg Oral QHS Mantaj Chamberlin, MD   50 mg at 04/13/17 2140   Current Outpatient Prescriptions  Medication Sig Dispense Refill  . acetaminophen (TYLENOL) 325 MG tablet Take 650 mg by  mouth every 6 (six) hours as needed for mild pain.     Marland Kitchen acetaminophen (TYLENOL) 500 MG tablet Take 1,000 mg by mouth every 6 (six) hours as needed for moderate pain.    Marland Kitchen acetaminophen-codeine (TYLENOL #3) 300-30 MG tablet Take 1 tablet by mouth every 8 (eight) hours as needed for moderate pain or severe pain.     Marland Kitchen ALPRAZolam (XANAX) 0.25 MG tablet Take 0.25 mg by mouth 3 (three) times daily as needed for anxiety or sleep.    Marland Kitchen diclofenac (VOLTAREN) 50 MG EC tablet Take 50 mg by mouth daily.     . Iron-FA-B Cmp-C-Biot-Probiotic (FUSION PLUS PO) Take 1 capsule by mouth daily.     Marland Kitchen levothyroxine (SYNTHROID, LEVOTHROID) 75 MCG tablet Take 75 mcg by mouth every morning.    . lidocaine (LIDODERM) 5 % Place 1 patch onto the skin daily. Remove & Discard patch within 12 hours or as directed by MD 30 patch 0  .  lisinopril (PRINIVIL,ZESTRIL) 40 MG tablet Take 40 mg by mouth every morning.     . mirtazapine (REMERON) 15 MG tablet Take 15 mg by mouth at bedtime.     Marland Kitchen omeprazole (PRILOSEC) 20 MG capsule TAKE ONE CAPSULE BY MOUTH TWICE DAILY. 60 capsule 2  . oxyCODONE-acetaminophen (PERCOCET) 5-325 MG tablet Take 0.5-1 tablets by mouth every 6 (six) hours as needed for severe pain. 20 tablet 0  . QUEtiapine (SEROQUEL) 200 MG tablet Take 200 mg by mouth at bedtime.    Derrill Memo ON 04/15/2017] escitalopram (LEXAPRO) 10 MG tablet Take 1 tablet (10 mg total) by mouth daily. 30 tablet 0  . methocarbamol (ROBAXIN) 500 MG tablet Take 1 tablet (500 mg total) by mouth every 6 (six) hours as needed for muscle spasms. (Patient not taking: Reported on 04/11/2017) 30 tablet 0  . predniSONE (DELTASONE) 20 MG tablet 1 tab daily for 5 days (Patient not taking: Reported on 04/11/2017) 11 tablet 0  . QUEtiapine (SEROQUEL) 50 MG tablet Take 1 tablet (50 mg total) by mouth at bedtime. 30 tablet 0    Musculoskeletal: Strength & Muscle Tone: decreased Gait & Station: unsteady Patient leans: Front  Psychiatric Specialty Exam: Physical Exam  Psychiatric: She has a normal mood and affect. Her speech is normal and behavior is normal. Judgment and thought content normal. Cognition and memory are impaired.    Review of Systems  HENT: Negative.   Eyes: Negative.   Respiratory: Negative.   Cardiovascular: Negative.   Gastrointestinal: Negative.   Genitourinary: Negative.   Musculoskeletal: Negative.   Skin: Negative.   Neurological: Negative.   Endo/Heme/Allergies: Negative.   Psychiatric/Behavioral: Negative.     Blood pressure (!) 148/87, pulse 69, temperature 98.7 F (37.1 C), temperature source Oral, resp. rate 20, SpO2 95 %.There is no height or weight on file to calculate BMI.  General Appearance: Casual  Eye Contact:  Good  Speech:  Clear and Coherent  Volume:  Normal  Mood:  Euthymic  Affect:  Appropriate  Thought  Process:  Coherent  Orientation:  Other:  only to person and place  Thought Content:  Logical  Suicidal Thoughts:  No  Homicidal Thoughts:  No  Memory:  Immediate;   Good Recent;   Poor Remote;   Poor  Judgement:  Other:  marginal  Insight:  Shallow  Psychomotor Activity:  Decreased  Concentration:  Concentration: Fair and Attention Span: Fair  Recall:  [employer] of Knowledge:  Fair  Language:  Good  Akathisia:  No  Handed:  Right  AIMS (if indicated):     Assets:  Communication Skills Social Support  ADL's:  Impaired  Cognition:  Impaired,  Moderate  Sleep:   fair     Treatment Plan Summary:  Major depressive disorder, recurrent, mild Patient is cleared for discharge home today.  Continue  Seroquel 50 mg qhs for insomnia/mood. Continue Lexapro 10 mg daily for depression.  Disposition: Discharge home to family. Follow up with a Geriatric psychiatrist at [facility].  Corena Pilgrim, MD 04/14/2017 11:31 AM

## 2017-04-14 NOTE — Progress Notes (Signed)
CSW spoke with patients son, Gwyndolyn Saxon, per patients request for pickup at discharge. Patients son stated he lives in [place] and would be able to pick patient up in about an hour. Patients son requested information of any new prescriptions. CSW informed son this information would be provided at discharge.   Kingsley Spittle, LCSWA Clinical Social Worker [phone]

## 2017-04-14 NOTE — BH Assessment (Signed)
Per Dr. Darleene Cleaver and Waylan Boga, DNP, patient is psychiatrically cleared. IVC rescinded by Dr. Darleene Cleaver. Patient to discharge back home.

## 2017-04-14 NOTE — BHH Suicide Risk Assessment (Signed)
Suicide Risk Assessment  Discharge Assessment   [facility] Discharge Suicide Risk Assessment   Principal Problem: Major depression, recurrent [facility]) Discharge Diagnoses:  Patient Active Problem List   Diagnosis Date Noted  . Major depression, recurrent ([place]) [F33.9] 04/12/2017    Priority: High  . Back pain [M54.9] 03/30/2017  . Dementia without behavioral disturbance [F03.90] 03/30/2017  . Constipation [K59.00] 08/18/2015  . Mass, ovarian [N83.9] 11/24/2013  . Esophagitis due to drug [K20.8, T50.904A] 10/19/2013  . Colon cancer screening [Z12.11] 10/19/2013  . Bradycardia [R00.1] 06/05/2012  . Fracture of patella, left, closed [S82.002A] 04/01/2012  . Fractured pelvis ([place]) [S32.9XXA] 11/19/2011  . Celiac disease [K90.0] 11/13/2011  . Weakness generalized [R53.1] 09/27/2011  . Hyponatremia [E87.1] 09/27/2011  . Psychogenic polydipsia [R63.1, F54] 09/27/2011  . Dehydration [E86.0] 09/27/2011  . Anemia [D64.9] 09/21/2011  . Fall [W19.XXXA] 09/21/2011  . Pelvic fracture (HCC) [S32.9XXA] 09/21/2011  . Emphysema [492] 09/21/2011  . Dysphagia [R13.10] 09/21/2011  . Chest pain [786.5] 09/21/2011  . SIADH (syndrome of inappropriate ADH production) ([place]) [E22.2] 09/21/2011  . Hematoma [T14.8XXA] 09/21/2011  . GERD (gastroesophageal reflux disease) [K21.9] 09/21/2011  . Hypertension [I10] 09/20/2011  . Hypothyroidism [E03.9] 09/20/2011  . Depression [F32.9] 09/20/2011  . Headache(784.0) [R51] 09/20/2011    Total Time spent with patient: 30 minutes  Musculoskeletal: Strength & Muscle Tone: decreased Gait & Station: unsteady Patient leans: Front  Psychiatric Specialty Exam: Physical Exam  Psychiatric: She has a normal mood and affect. Her speech is normal and behavior is normal. Judgment and thought content normal. Cognition and memory are impaired.    Review of Systems  HENT: Negative.   Eyes: Negative.   Respiratory: Negative.   Cardiovascular: Negative.   Gastrointestinal:  Negative.   Genitourinary: Negative.   Musculoskeletal: Negative.   Skin: Negative.   Neurological: Negative.   Endo/Heme/Allergies: Negative.   Psychiatric/Behavioral: Negative.     Blood pressure (!) 148/87, pulse 69, temperature 98.7 F (37.1 C), temperature source Oral, resp. rate 20, SpO2 95 %.There is no height or weight on file to calculate BMI.  General Appearance: Casual  Eye Contact:  Good  Speech:  Clear and Coherent  Volume:  Normal  Mood:  Euthymic  Affect:  Appropriate  Thought Process:  Coherent  Orientation:  Other:  only to person and place  Thought Content:  Logical  Suicidal Thoughts:  No  Homicidal Thoughts:  No  Memory:  Immediate;   Good Recent;   Poor Remote;   Poor  Judgement:  Other:  marginal  Insight:  Shallow  Psychomotor Activity:  Decreased  Concentration:  Concentration: Fair and Attention Span: Fair  Recall:  [employer] of Knowledge:  Fair  Language:  Good  Akathisia:  No  Handed:  Right  AIMS (if indicated):     Assets:  [employer] logistics/support/administrative officer Social Support  ADL's:  Impaired  Cognition:  Impaired,  Moderate  Sleep:   fair    Mental Status Per Nursing Assessment::   On Admission:   depression with suicidal ideations  Demographic Factors:  Age 26 or older and Caucasian  Loss Factors: NA  Historical Factors: NA  Risk Reduction Factors:   Sense of responsibility to family and Positive social support  Continued Clinical Symptoms:  Depression, mild  Cognitive Features That Contribute To Risk:  None    Suicide Risk:  Minimal: No identifiable suicidal ideation.  Patients presenting with no risk factors but with morbid ruminations; may be classified as minimal risk based on the  severity of the depressive symptoms    Plan Of Care/Follow-up recommendations:  Activity:  as tolerated Diet:  heart healthy diet  Lonni Dirden, NP 04/14/2017, 11:46 AM

## 2017-04-14 NOTE — ED Notes (Signed)
Bed: VJ50 Expected date:  Expected time:  Means of arrival:  Comments: EMS 80's suicidal

## 2017-04-14 NOTE — ED Provider Notes (Signed)
[facility] DEPT Provider Note   CSN: [id number] Arrival date & time: 04/14/17  1342     History   Chief Complaint No chief complaint on file.   HPI Felicia Harvey is a 83 y.o. female.  HPI Patient presents to the emergency department after recently being discharged in the ER with a diagnosis of new thoracic compression fracture and comments regarding rather die and then having pain like this.  Her pain was rather controlled in the emergency department she is discharged home with Tylenol but per the son on her way home she began having severe back pain at which point she said again that she would rather die than have this type of pain.  He contacted EMS and EMS brought her back to the ER.  At this time the the patient reports the pain is mild.  She denies weakness in arms or legs.  She reports no new pain.  She denies chest pain.  Denies abdominal pain.  She reports that she does not want asked to kill her self but that is how she expresses how she feels regarding her pain.   Past Medical History:  Diagnosis Date  . Anxiety   . Celiac disease   . Chest pain 09/21/2011   OCCASIONAL  . Constipation   . Dementia   . Difficulty sleeping    TAKES MED TO SLEEP  . Dysphagia 09/21/2011  . Dysrhythmia    "heart arrthymia" per son, but unsure what kind  . GERD (gastroesophageal reflux disease)   . HTN (hypertension)   . Hypothyroidism   . Knee fracture, left 03/2012  . Osteopenia   . Ovarian mass   . 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 Schizophrenia ([place])    FAMILY UNSURE OF THIS    Patient Active Problem List   Diagnosis Date Noted  . Major depression, recurrent ([place]) 04/12/2017  . Back pain 03/30/2017  . Dementia without behavioral disturbance 03/30/2017  . Constipation 08/18/2015  . Mass, ovarian 11/24/2013  . Esophagitis due to drug 10/19/2013  . Colon cancer screening 10/19/2013  . Bradycardia 06/05/2012  . Fracture of patella, left, closed 04/01/2012  . Fractured  pelvis ([place]) 11/19/2011  . Celiac disease 11/13/2011  . Weakness generalized 09/27/2011  . Hyponatremia 09/27/2011  . 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 Dehydration 09/27/2011  . Anemia 09/21/2011  . Fall 09/21/2011  . Pelvic fracture ([place]) 09/21/2011  . Emphysema 09/21/2011  . Dysphagia 09/21/2011  . Chest pain 09/21/2011  . SIADH (syndrome of inappropriate ADH production) ([place]) 09/21/2011  . Hematoma 09/21/2011  . GERD (gastroesophageal reflux disease) 09/21/2011  . Hypertension 09/20/2011  . Hypothyroidism 09/20/2011  . Depression 09/20/2011  . Headache(784.0) 09/20/2011    Past Surgical History:  Procedure Laterality Date  . ESOPHAGOGASTRODUODENOSCOPY  09/27/2011   Inflammatory changes midesophagus most likely pill-induced injury (Fosamax), large diaphragmatic hernia, biopsies from small bowel consistent with celiac disease  . ESOPHAGOGASTRODUODENOSCOPY (EGD) WITH ESOPHAGEAL DILATION N/A 07/13/2013   JOI:NOMVEH esophagitis-more likely pill-induced s/p esophageal biopsy. Noncritical. Schatzki's ring s/p passage of a Maloney dilator. Hiatal hernia. Abnormal duodenum consistent with prior diagnosis of celiac disease. Status post 74 French Maloney dilator. Esophageal biopsies benign showed marked acute and chronic inflammation.  Marland Kitchen LAPAROSCOPIC SALPINGO OOPHERECTOMY Bilateral 11/16/2013   Procedure: LAPAROSCOPIC BILATERAL SALPINGO OOPHORECTOMY;  Surgeon: Imagene Gurney A. Alycia Rossetti, MD;  Location: [facility];  Service: Gynecology;  Laterality: Bilateral;  . left hip replacement    . left shoulder    . THYROIDECTOMY    .  VERTEBROPLASTY      OB History    No data available       Home Medications    Prior to Admission medications   Medication Sig Start Date End Date Taking? Authorizing Provider  ALPRAZolam (XANAX) 0.25 MG tablet Take 0.25 mg by mouth 3 (three) times daily as needed for anxiety or sleep.    [provider]  diclofenac (VOLTAREN) 50 MG EC tablet Take 50 mg by mouth  daily.     [provider]  escitalopram (LEXAPRO) 10 MG tablet Take 1 tablet (10 mg total) by mouth daily. 04/15/17   Patrecia Pour, NP  Iron-FA-B Cmp-C-Biot-Probiotic (FUSION PLUS PO) Take 1 capsule by mouth daily.     [provider]  levothyroxine (SYNTHROID, LEVOTHROID) 75 MCG tablet Take 75 mcg by mouth every morning.    [provider]  lidocaine (LIDODERM) 5 % Place 1 patch onto the skin daily. Remove & Discard patch within 12 hours or as directed by MD 04/02/17   Mesner, Corene Cornea, MD  lisinopril (PRINIVIL,ZESTRIL) 40 MG tablet Take 40 mg by mouth every morning.     [provider]  methocarbamol (ROBAXIN) 500 MG tablet Take 1 tablet (500 mg total) by mouth every 6 (six) hours as needed for muscle spasms. Patient not taking: Reported on 04/11/2017 03/31/17   Kathie Dike, MD  mirtazapine (REMERON) 15 MG tablet Take 15 mg by mouth at bedtime.  04/08/17   [provider]  omeprazole (PRILOSEC) 20 MG capsule TAKE ONE CAPSULE BY MOUTH TWICE DAILY. 03/27/17   Carlis Stable, NP  oxyCODONE-acetaminophen (PERCOCET/ROXICET) 5-325 MG tablet Take 1 tablet by mouth every 4 (four) hours as needed for severe pain. 04/14/17   Jola Schmidt, MD  predniSONE (DELTASONE) 20 MG tablet 1 tab daily for 5 days Patient not taking: Reported on 04/11/2017 04/02/17   Mesner, Corene Cornea, MD  QUEtiapine (SEROQUEL) 200 MG tablet Take 200 mg by mouth at bedtime.    [provider]  QUEtiapine (SEROQUEL) 50 MG tablet Take 1 tablet (50 mg total) by mouth at bedtime. 04/14/17   Patrecia Pour, NP    Family History Family History  Problem Relation Age of Onset  . Cancer Sister     brain tumor  . Hypertension Daughter   . Kidney disease Sister   . Hypertension Brother     Social History Social History  Substance Use Topics  . Smoking status: Never Smoker  . Smokeless tobacco: Never Used  . Alcohol use No     Comment: rarely     Allergies   Gluten   Review of  Systems Review of Systems  All other systems reviewed and are negative.    Physical Exam Updated Vital Signs There were no vitals taken for this visit.  Physical Exam  Constitutional: She is oriented to person, place, and time. She appears well-developed and well-nourished. No distress.  HENT:  Head: Normocephalic and atraumatic.  Eyes: EOM are normal.  Neck: Normal range of motion.  Cardiovascular: Normal rate, regular rhythm and normal heart sounds.   Pulmonary/Chest: Effort normal and breath sounds normal.  Abdominal: Soft. She exhibits no distension. There is no tenderness.  Musculoskeletal: Normal range of motion.  Neurological: She is alert and oriented to person, place, and time.  Skin: Skin is warm and dry.  Psychiatric: She has a normal mood and affect. Judgment normal.  Nursing note and vitals reviewed.    ED Treatments / Results  Labs (all  labs ordered are listed, but only abnormal results are displayed) Labs Reviewed - No data to display  EKG  EKG Interpretation None       Radiology No results found.  Procedures Procedures (including critical care time)  Medications Ordered in ED Medications  oxyCODONE-acetaminophen (PERCOCET/ROXICET) 5-325 MG per tablet 1 tablet (not administered)     Initial Impression / Assessment and Plan / ED Course  I have reviewed the triage vital signs and the nursing notes.  Pertinent labs & imaging results that were available during my care of the patient were reviewed by me and considered in my medical decision making (see chart for details).     I spoke with the patient's son at length.  He agrees that she is not threat to herself or others.  He understands more now about treatment of pain for compression fractures.  Patient be given a Percocet here and discharged home with Percocet follow-up with her primary care physician.  If her pain continues she may benefit from outpatient vertebroplasty.  Final Clinical  Impressions(s) / ED Diagnoses   Final diagnoses:  Closed compression fracture of thoracic vertebra, initial encounter (HCC)    New Prescriptions New Prescriptions   OXYCODONE-ACETAMINOPHEN (PERCOCET/ROXICET) 5-325 MG TABLET    Take 1 tablet by mouth every 4 (four) hours as needed for severe pain.     Jola Schmidt, MD 04/14/17 1434

## 2017-04-16 DIAGNOSIS — M8580 Other specified disorders of bone density and structure, unspecified site: Secondary | ICD-10-CM | POA: Diagnosis not present

## 2017-04-16 DIAGNOSIS — M4854XD Collapsed vertebra, not elsewhere classified, thoracic region, subsequent encounter for fracture with routine healing: Secondary | ICD-10-CM | POA: Diagnosis not present

## 2017-04-16 DIAGNOSIS — I10 Essential (primary) hypertension: Secondary | ICD-10-CM | POA: Diagnosis not present

## 2017-04-16 DIAGNOSIS — F0390 Unspecified dementia without behavioral disturbance: Secondary | ICD-10-CM | POA: Diagnosis not present

## 2017-04-16 DIAGNOSIS — K219 Gastro-esophageal reflux disease without esophagitis: Secondary | ICD-10-CM | POA: Diagnosis not present

## 2017-04-16 DIAGNOSIS — E039 Hypothyroidism, unspecified: Secondary | ICD-10-CM | POA: Diagnosis not present

## 2017-04-17 DIAGNOSIS — M8580 Other specified disorders of bone density and structure, unspecified site: Secondary | ICD-10-CM | POA: Diagnosis not present

## 2017-04-17 DIAGNOSIS — M4854XD Collapsed vertebra, not elsewhere classified, thoracic region, subsequent encounter for fracture with routine healing: Secondary | ICD-10-CM | POA: Diagnosis not present

## 2017-04-17 DIAGNOSIS — F0390 Unspecified dementia without behavioral disturbance: Secondary | ICD-10-CM | POA: Diagnosis not present

## 2017-04-17 DIAGNOSIS — K219 Gastro-esophageal reflux disease without esophagitis: Secondary | ICD-10-CM | POA: Diagnosis not present

## 2017-04-17 DIAGNOSIS — E039 Hypothyroidism, unspecified: Secondary | ICD-10-CM | POA: Diagnosis not present

## 2017-04-17 DIAGNOSIS — I10 Essential (primary) hypertension: Secondary | ICD-10-CM | POA: Diagnosis not present

## 2017-04-21 DIAGNOSIS — E039 Hypothyroidism, unspecified: Secondary | ICD-10-CM | POA: Diagnosis not present

## 2017-04-21 DIAGNOSIS — M8580 Other specified disorders of bone density and structure, unspecified site: Secondary | ICD-10-CM | POA: Diagnosis not present

## 2017-04-21 DIAGNOSIS — M4854XD Collapsed vertebra, not elsewhere classified, thoracic region, subsequent encounter for fracture with routine healing: Secondary | ICD-10-CM | POA: Diagnosis not present

## 2017-04-21 DIAGNOSIS — F0390 Unspecified dementia without behavioral disturbance: Secondary | ICD-10-CM | POA: Diagnosis not present

## 2017-04-21 DIAGNOSIS — I10 Essential (primary) hypertension: Secondary | ICD-10-CM | POA: Diagnosis not present

## 2017-04-21 DIAGNOSIS — K219 Gastro-esophageal reflux disease without esophagitis: Secondary | ICD-10-CM | POA: Diagnosis not present

## 2017-04-23 DIAGNOSIS — M545 Low back pain: Secondary | ICD-10-CM | POA: Diagnosis not present

## 2017-04-23 DIAGNOSIS — Z681 Body mass index (BMI) 19 or less, adult: Secondary | ICD-10-CM | POA: Diagnosis not present

## 2017-04-23 DIAGNOSIS — F339 Major depressive disorder, recurrent, unspecified: Secondary | ICD-10-CM | POA: Diagnosis not present

## 2017-04-23 DIAGNOSIS — F209 Schizophrenia, unspecified: Secondary | ICD-10-CM | POA: Diagnosis not present

## 2017-04-24 DIAGNOSIS — M4854XD Collapsed vertebra, not elsewhere classified, thoracic region, subsequent encounter for fracture with routine healing: Secondary | ICD-10-CM | POA: Diagnosis not present

## 2017-04-24 DIAGNOSIS — K219 Gastro-esophageal reflux disease without esophagitis: Secondary | ICD-10-CM | POA: Diagnosis not present

## 2017-04-24 DIAGNOSIS — I10 Essential (primary) hypertension: Secondary | ICD-10-CM | POA: Diagnosis not present

## 2017-04-24 DIAGNOSIS — E039 Hypothyroidism, unspecified: Secondary | ICD-10-CM | POA: Diagnosis not present

## 2017-04-24 DIAGNOSIS — F0390 Unspecified dementia without behavioral disturbance: Secondary | ICD-10-CM | POA: Diagnosis not present

## 2017-04-24 DIAGNOSIS — M8580 Other specified disorders of bone density and structure, unspecified site: Secondary | ICD-10-CM | POA: Diagnosis not present

## 2017-04-28 DIAGNOSIS — E039 Hypothyroidism, unspecified: Secondary | ICD-10-CM | POA: Diagnosis not present

## 2017-04-28 DIAGNOSIS — M4854XD Collapsed vertebra, not elsewhere classified, thoracic region, subsequent encounter for fracture with routine healing: Secondary | ICD-10-CM | POA: Diagnosis not present

## 2017-04-28 DIAGNOSIS — M8580 Other specified disorders of bone density and structure, unspecified site: Secondary | ICD-10-CM | POA: Diagnosis not present

## 2017-04-28 DIAGNOSIS — I10 Essential (primary) hypertension: Secondary | ICD-10-CM | POA: Diagnosis not present

## 2017-04-28 DIAGNOSIS — F0390 Unspecified dementia without behavioral disturbance: Secondary | ICD-10-CM | POA: Diagnosis not present

## 2017-04-28 DIAGNOSIS — K219 Gastro-esophageal reflux disease without esophagitis: Secondary | ICD-10-CM | POA: Diagnosis not present

## 2017-05-01 DIAGNOSIS — M4854XD Collapsed vertebra, not elsewhere classified, thoracic region, subsequent encounter for fracture with routine healing: Secondary | ICD-10-CM | POA: Diagnosis not present

## 2017-05-01 DIAGNOSIS — F0390 Unspecified dementia without behavioral disturbance: Secondary | ICD-10-CM | POA: Diagnosis not present

## 2017-05-01 DIAGNOSIS — M8580 Other specified disorders of bone density and structure, unspecified site: Secondary | ICD-10-CM | POA: Diagnosis not present

## 2017-05-01 DIAGNOSIS — E039 Hypothyroidism, unspecified: Secondary | ICD-10-CM | POA: Diagnosis not present

## 2017-05-01 DIAGNOSIS — I10 Essential (primary) hypertension: Secondary | ICD-10-CM | POA: Diagnosis not present

## 2017-05-01 DIAGNOSIS — K219 Gastro-esophageal reflux disease without esophagitis: Secondary | ICD-10-CM | POA: Diagnosis not present

## 2017-05-07 DIAGNOSIS — F0390 Unspecified dementia without behavioral disturbance: Secondary | ICD-10-CM | POA: Diagnosis not present

## 2017-05-07 DIAGNOSIS — I10 Essential (primary) hypertension: Secondary | ICD-10-CM | POA: Diagnosis not present

## 2017-05-07 DIAGNOSIS — K219 Gastro-esophageal reflux disease without esophagitis: Secondary | ICD-10-CM | POA: Diagnosis not present

## 2017-05-07 DIAGNOSIS — M8580 Other specified disorders of bone density and structure, unspecified site: Secondary | ICD-10-CM | POA: Diagnosis not present

## 2017-05-07 DIAGNOSIS — E039 Hypothyroidism, unspecified: Secondary | ICD-10-CM | POA: Diagnosis not present

## 2017-05-07 DIAGNOSIS — M4854XD Collapsed vertebra, not elsewhere classified, thoracic region, subsequent encounter for fracture with routine healing: Secondary | ICD-10-CM | POA: Diagnosis not present

## 2017-05-08 DIAGNOSIS — M4854XD Collapsed vertebra, not elsewhere classified, thoracic region, subsequent encounter for fracture with routine healing: Secondary | ICD-10-CM | POA: Diagnosis not present

## 2017-05-08 DIAGNOSIS — M8580 Other specified disorders of bone density and structure, unspecified site: Secondary | ICD-10-CM | POA: Diagnosis not present

## 2017-05-08 DIAGNOSIS — E039 Hypothyroidism, unspecified: Secondary | ICD-10-CM | POA: Diagnosis not present

## 2017-05-08 DIAGNOSIS — F0390 Unspecified dementia without behavioral disturbance: Secondary | ICD-10-CM | POA: Diagnosis not present

## 2017-05-08 DIAGNOSIS — I10 Essential (primary) hypertension: Secondary | ICD-10-CM | POA: Diagnosis not present

## 2017-05-08 DIAGNOSIS — K219 Gastro-esophageal reflux disease without esophagitis: Secondary | ICD-10-CM | POA: Diagnosis not present

## 2017-05-09 DIAGNOSIS — M4854XD Collapsed vertebra, not elsewhere classified, thoracic region, subsequent encounter for fracture with routine healing: Secondary | ICD-10-CM | POA: Diagnosis not present

## 2017-05-09 DIAGNOSIS — I10 Essential (primary) hypertension: Secondary | ICD-10-CM | POA: Diagnosis not present

## 2017-05-09 DIAGNOSIS — M8580 Other specified disorders of bone density and structure, unspecified site: Secondary | ICD-10-CM | POA: Diagnosis not present

## 2017-05-09 DIAGNOSIS — K219 Gastro-esophageal reflux disease without esophagitis: Secondary | ICD-10-CM | POA: Diagnosis not present

## 2017-05-09 DIAGNOSIS — E039 Hypothyroidism, unspecified: Secondary | ICD-10-CM | POA: Diagnosis not present

## 2017-05-09 DIAGNOSIS — F0390 Unspecified dementia without behavioral disturbance: Secondary | ICD-10-CM | POA: Diagnosis not present

## 2017-05-13 DIAGNOSIS — F0390 Unspecified dementia without behavioral disturbance: Secondary | ICD-10-CM | POA: Diagnosis not present

## 2017-05-13 DIAGNOSIS — E039 Hypothyroidism, unspecified: Secondary | ICD-10-CM | POA: Diagnosis not present

## 2017-05-13 DIAGNOSIS — K219 Gastro-esophageal reflux disease without esophagitis: Secondary | ICD-10-CM | POA: Diagnosis not present

## 2017-05-13 DIAGNOSIS — I10 Essential (primary) hypertension: Secondary | ICD-10-CM | POA: Diagnosis not present

## 2017-05-13 DIAGNOSIS — M8580 Other specified disorders of bone density and structure, unspecified site: Secondary | ICD-10-CM | POA: Diagnosis not present

## 2017-05-13 DIAGNOSIS — M4854XD Collapsed vertebra, not elsewhere classified, thoracic region, subsequent encounter for fracture with routine healing: Secondary | ICD-10-CM | POA: Diagnosis not present

## 2017-05-14 DIAGNOSIS — M4854XD Collapsed vertebra, not elsewhere classified, thoracic region, subsequent encounter for fracture with routine healing: Secondary | ICD-10-CM | POA: Diagnosis not present

## 2017-05-14 DIAGNOSIS — F0390 Unspecified dementia without behavioral disturbance: Secondary | ICD-10-CM | POA: Diagnosis not present

## 2017-05-14 DIAGNOSIS — I10 Essential (primary) hypertension: Secondary | ICD-10-CM | POA: Diagnosis not present

## 2017-05-14 DIAGNOSIS — K219 Gastro-esophageal reflux disease without esophagitis: Secondary | ICD-10-CM | POA: Diagnosis not present

## 2017-05-14 DIAGNOSIS — E039 Hypothyroidism, unspecified: Secondary | ICD-10-CM | POA: Diagnosis not present

## 2017-05-14 DIAGNOSIS — M8580 Other specified disorders of bone density and structure, unspecified site: Secondary | ICD-10-CM | POA: Diagnosis not present

## 2017-05-15 DIAGNOSIS — E039 Hypothyroidism, unspecified: Secondary | ICD-10-CM | POA: Diagnosis not present

## 2017-05-15 DIAGNOSIS — K219 Gastro-esophageal reflux disease without esophagitis: Secondary | ICD-10-CM | POA: Diagnosis not present

## 2017-05-15 DIAGNOSIS — M4854XD Collapsed vertebra, not elsewhere classified, thoracic region, subsequent encounter for fracture with routine healing: Secondary | ICD-10-CM | POA: Diagnosis not present

## 2017-05-15 DIAGNOSIS — M8580 Other specified disorders of bone density and structure, unspecified site: Secondary | ICD-10-CM | POA: Diagnosis not present

## 2017-05-15 DIAGNOSIS — F0390 Unspecified dementia without behavioral disturbance: Secondary | ICD-10-CM | POA: Diagnosis not present

## 2017-05-15 DIAGNOSIS — I10 Essential (primary) hypertension: Secondary | ICD-10-CM | POA: Diagnosis not present

## 2017-05-20 DIAGNOSIS — K219 Gastro-esophageal reflux disease without esophagitis: Secondary | ICD-10-CM | POA: Diagnosis not present

## 2017-05-20 DIAGNOSIS — E039 Hypothyroidism, unspecified: Secondary | ICD-10-CM | POA: Diagnosis not present

## 2017-05-20 DIAGNOSIS — M4854XD Collapsed vertebra, not elsewhere classified, thoracic region, subsequent encounter for fracture with routine healing: Secondary | ICD-10-CM | POA: Diagnosis not present

## 2017-05-20 DIAGNOSIS — I10 Essential (primary) hypertension: Secondary | ICD-10-CM | POA: Diagnosis not present

## 2017-05-20 DIAGNOSIS — F0390 Unspecified dementia without behavioral disturbance: Secondary | ICD-10-CM | POA: Diagnosis not present

## 2017-05-20 DIAGNOSIS — M8580 Other specified disorders of bone density and structure, unspecified site: Secondary | ICD-10-CM | POA: Diagnosis not present

## 2017-05-21 DIAGNOSIS — I10 Essential (primary) hypertension: Secondary | ICD-10-CM | POA: Diagnosis not present

## 2017-05-21 DIAGNOSIS — M8580 Other specified disorders of bone density and structure, unspecified site: Secondary | ICD-10-CM | POA: Diagnosis not present

## 2017-05-21 DIAGNOSIS — E039 Hypothyroidism, unspecified: Secondary | ICD-10-CM | POA: Diagnosis not present

## 2017-05-21 DIAGNOSIS — K219 Gastro-esophageal reflux disease without esophagitis: Secondary | ICD-10-CM | POA: Diagnosis not present

## 2017-05-21 DIAGNOSIS — M4854XD Collapsed vertebra, not elsewhere classified, thoracic region, subsequent encounter for fracture with routine healing: Secondary | ICD-10-CM | POA: Diagnosis not present

## 2017-05-21 DIAGNOSIS — F0390 Unspecified dementia without behavioral disturbance: Secondary | ICD-10-CM | POA: Diagnosis not present

## 2017-05-22 DIAGNOSIS — R531 Weakness: Secondary | ICD-10-CM | POA: Diagnosis not present

## 2017-05-29 ENCOUNTER — Other Ambulatory Visit: Payer: Self-pay | Admitting: Nurse Practitioner

## 2017-06-08 ENCOUNTER — Emergency Department (HOSPITAL_COMMUNITY): Payer: Medicare Other

## 2017-06-08 ENCOUNTER — Emergency Department (HOSPITAL_COMMUNITY)
Admission: EM | Admit: 2017-06-08 | Discharge: 2017-06-08 | Disposition: A | Discharge status: Home / Self Care | Payer: Medicare Other | Attending: Emergency Medicine | Admitting: Emergency Medicine

## 2017-06-08 ENCOUNTER — Encounter (HOSPITAL_COMMUNITY): Payer: Self-pay | Admitting: Emergency Medicine

## 2017-06-08 DIAGNOSIS — E039 Hypothyroidism, unspecified: Secondary | ICD-10-CM | POA: Diagnosis not present

## 2017-06-08 DIAGNOSIS — M25561 Pain in right knee: Secondary | ICD-10-CM | POA: Diagnosis not present

## 2017-06-08 DIAGNOSIS — R51 Headache: Secondary | ICD-10-CM | POA: Diagnosis not present

## 2017-06-08 DIAGNOSIS — R079 Chest pain, unspecified: Secondary | ICD-10-CM | POA: Diagnosis not present

## 2017-06-08 DIAGNOSIS — Y929 Unspecified place or not applicable: Secondary | ICD-10-CM | POA: Diagnosis not present

## 2017-06-08 DIAGNOSIS — Z96642 Presence of left artificial hip joint: Secondary | ICD-10-CM | POA: Diagnosis not present

## 2017-06-08 DIAGNOSIS — S0190XA Unspecified open wound of unspecified part of head, initial encounter: Secondary | ICD-10-CM | POA: Diagnosis not present

## 2017-06-08 DIAGNOSIS — I10 Essential (primary) hypertension: Secondary | ICD-10-CM | POA: Diagnosis not present

## 2017-06-08 DIAGNOSIS — S80911A Unspecified superficial injury of right knee, initial encounter: Secondary | ICD-10-CM | POA: Insufficient documentation

## 2017-06-08 DIAGNOSIS — Y999 Unspecified external cause status: Secondary | ICD-10-CM | POA: Insufficient documentation

## 2017-06-08 DIAGNOSIS — S0990XA Unspecified injury of head, initial encounter: Secondary | ICD-10-CM | POA: Insufficient documentation

## 2017-06-08 DIAGNOSIS — S0181XA Laceration without foreign body of other part of head, initial encounter: Secondary | ICD-10-CM | POA: Diagnosis not present

## 2017-06-08 DIAGNOSIS — M542 Cervicalgia: Secondary | ICD-10-CM | POA: Diagnosis not present

## 2017-06-08 DIAGNOSIS — Z471 Aftercare following joint replacement surgery: Secondary | ICD-10-CM | POA: Diagnosis not present

## 2017-06-08 DIAGNOSIS — S199XXA Unspecified injury of neck, initial encounter: Secondary | ICD-10-CM | POA: Diagnosis not present

## 2017-06-08 DIAGNOSIS — Z79899 Other long term (current) drug therapy: Secondary | ICD-10-CM | POA: Insufficient documentation

## 2017-06-08 DIAGNOSIS — W19XXXA Unspecified fall, initial encounter: Secondary | ICD-10-CM

## 2017-06-08 DIAGNOSIS — S098XXA Other specified injuries of head, initial encounter: Secondary | ICD-10-CM | POA: Diagnosis not present

## 2017-06-08 DIAGNOSIS — S299XXA Unspecified injury of thorax, initial encounter: Secondary | ICD-10-CM | POA: Diagnosis not present

## 2017-06-08 DIAGNOSIS — Y939 Activity, unspecified: Secondary | ICD-10-CM | POA: Diagnosis not present

## 2017-06-08 MED ORDER — ACETAMINOPHEN 325 MG PO TABS
650.0000 mg | ORAL_TABLET | Freq: Once | ORAL | Status: AC
  Administered 2017-06-08: 650 mg via ORAL
  Filled 2017-06-08: qty 2

## 2017-06-08 NOTE — ED Provider Notes (Signed)
[facility] DEPT Provider Note   CSN: [id number] Arrival date & time: 06/08/17  [place]     History   Chief Complaint Chief Complaint  Patient presents with  . Fall    HPI Felicia Harvey is a 83 y.o. female.    The patient tripped and fell and hit her head no loss of consciousness. Patient complains of some chest pain with movement and minor right knee pain   The history is provided by the patient and a relative. No language interpreter was used.  Fall  This is a new problem. The current episode started 3 to 5 hours ago. The problem occurs rarely. The problem has been resolved. Associated symptoms include chest pain. Pertinent negatives include no abdominal pain and no headaches. Exacerbated by: Movement of chest and right knee. Nothing relieves the symptoms. She has tried nothing for the symptoms. The treatment provided no relief.    Past Medical History:  Diagnosis Date  . Anxiety   . Celiac disease   . Chest pain 09/21/2011   OCCASIONAL  . Constipation   . Dementia   . Difficulty sleeping    TAKES MED TO SLEEP  . Dysphagia 09/21/2011  . Dysrhythmia    "heart arrthymia" per son, but unsure what kind  . GERD (gastroesophageal reflux disease)   . HTN (hypertension)   . Hypothyroidism   . Knee fracture, left 03/2012  . Osteopenia   . Ovarian mass   . 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 Schizophrenia ([place])    FAMILY UNSURE OF THIS    Patient Active Problem List   Diagnosis Date Noted  . Major depression, recurrent ([place]) 04/12/2017  . Back pain 03/30/2017  . Dementia without behavioral disturbance 03/30/2017  . Constipation 08/18/2015  . Mass, ovarian 11/24/2013  . Esophagitis due to drug 10/19/2013  . Colon cancer screening 10/19/2013  . Bradycardia 06/05/2012  . Fracture of patella, left, closed 04/01/2012  . Fractured pelvis ([place]) 11/19/2011  . Celiac disease 11/13/2011  . Weakness generalized 09/27/2011  . Hyponatremia 09/27/2011  . Psychogenic  polydipsia 09/27/2011  . Dehydration 09/27/2011  . Anemia 09/21/2011  . Fall 09/21/2011  . Pelvic fracture ([place]) 09/21/2011  . Emphysema 09/21/2011  . Dysphagia 09/21/2011  . Chest pain 09/21/2011  . SIADH (syndrome of inappropriate ADH production) ([place]) 09/21/2011  . Hematoma 09/21/2011  . GERD (gastroesophageal reflux disease) 09/21/2011  . Hypertension 09/20/2011  . Hypothyroidism 09/20/2011  . Depression 09/20/2011  . Headache(784.0) 09/20/2011    Past Surgical History:  Procedure Laterality Date  . ESOPHAGOGASTRODUODENOSCOPY  09/27/2011   Inflammatory changes midesophagus most likely pill-induced injury (Fosamax), large diaphragmatic hernia, biopsies from small bowel consistent with celiac disease  . ESOPHAGOGASTRODUODENOSCOPY (EGD) WITH ESOPHAGEAL DILATION N/A 07/13/2013   DTO:IZTIWP esophagitis-more likely pill-induced s/p esophageal biopsy. Noncritical. Schatzki's ring s/p passage of a Maloney dilator. Hiatal hernia. Abnormal duodenum consistent with prior diagnosis of celiac disease. Status post 79 French Maloney dilator. Esophageal biopsies benign showed marked acute and chronic inflammation.  Marland Kitchen LAPAROSCOPIC SALPINGO OOPHERECTOMY Bilateral 11/16/2013   Procedure: LAPAROSCOPIC BILATERAL SALPINGO OOPHORECTOMY;  Surgeon: Imagene Gurney A. Alycia Rossetti, MD;  Location: [facility] ORS;  Service: Gynecology;  Laterality: Bilateral;  . left hip replacement    . left shoulder    . THYROIDECTOMY    . VERTEBROPLASTY      OB History    No data available       Home Medications    Prior to Admission medications   Medication Sig Start Date End Date  Taking? Authorizing Provider  acetaminophen (TYLENOL) 500 MG tablet Take 1,000 mg by mouth every 6 (six) hours as needed for mild pain, moderate pain or headache.   Yes [provider]  diclofenac (VOLTAREN) 50 MG EC tablet Take 50 mg by mouth daily.    Yes [provider]  escitalopram (LEXAPRO) 10 MG tablet Take 1 tablet (10 mg total) by  mouth daily. 04/15/17  Yes Lord, Asa Saunas, NP  levothyroxine (SYNTHROID, LEVOTHROID) 75 MCG tablet Take 75 mcg by mouth every morning.   Yes [provider]  magnesium hydroxide (MILK OF MAGNESIA) 400 MG/5ML suspension Take 10 mLs by mouth daily as needed for mild constipation.   Yes [provider]  mirtazapine (REMERON) 15 MG tablet Take 15 mg by mouth at bedtime.  04/08/17  Yes [provider]  omeprazole (PRILOSEC) 20 MG capsule TAKE ONE CAPSULE BY MOUTH TWICE DAILY. 05/29/17  Yes Carlis Stable, NP  oxyCODONE-acetaminophen (PERCOCET/ROXICET) 5-325 MG tablet Take 1 tablet by mouth every 4 (four) hours as needed for severe pain. Patient taking differently: Take 1 tablet by mouth every 8 (eight) hours as needed for severe pain.  04/14/17  Yes Jola Schmidt, MD  polyethylene glycol powder (MIRALAX) powder Take 17 g by mouth daily.   Yes [provider]  QUEtiapine (SEROQUEL) 200 MG tablet Take 200 mg by mouth at bedtime.   Yes [provider]  QUEtiapine (SEROQUEL) 50 MG tablet Take 1 tablet (50 mg total) by mouth at bedtime. 04/14/17  Yes Patrecia Pour, NP  senna (SENOKOT) 8.6 MG TABS tablet Take 3 tablets by mouth daily.   Yes [provider]  Iron-FA-B Cmp-C-Biot-Probiotic (FUSION PLUS PO) Take 1 capsule by mouth daily.     [provider]    Family History Family History  Problem Relation Age of Onset  . Cancer Sister        brain tumor  . Hypertension Daughter   . Kidney disease Sister   . Hypertension Brother     Social History Social History  Substance Use Topics  . Smoking status: Never Smoker  . Smokeless tobacco: Never Used  . Alcohol use No     Comment: rarely     Allergies   Gluten   Review of Systems Review of Systems  Constitutional: Negative for appetite change and fatigue.  HENT: Negative for congestion, ear discharge and sinus pressure.        Headache  Eyes: Negative for discharge.  Respiratory:  Negative for cough.        Chest pain  Cardiovascular: Positive for chest pain.  Gastrointestinal: Negative for abdominal pain and diarrhea.  Genitourinary: Negative for frequency and hematuria.  Musculoskeletal: Negative for back pain.       Right knee pain  Skin: Negative for rash.  Neurological: Negative for seizures and headaches.  Psychiatric/Behavioral: Negative for hallucinations.     Physical Exam Updated Vital Signs BP (!) 162/94   Pulse 77   Temp 98.4 F (36.9 C)   Resp 19   Ht 4\' 11"  (1.499 m)   Wt 40.8 kg (90 lb)   SpO2 94%   BMI 18.18 kg/m   Physical Exam  Constitutional: She is oriented to person, place, and time. She appears well-developed.  HENT:  Head: Normocephalic.  2 cm laceration to occipital head  Eyes: Conjunctivae and EOM are normal. No scleral icterus.  Neck: Neck supple. No thyromegaly present.  Cardiovascular: Normal rate and regular rhythm.  Exam  reveals no gallop and no friction rub.   No murmur heard. Pulmonary/Chest: No stridor. She has no wheezes. She has no rales. She exhibits tenderness.  Abdominal: She exhibits no distension. There is no tenderness. There is no rebound.  Musculoskeletal: Normal range of motion. She exhibits no edema.  Minor tenderness to right knee  Lymphadenopathy:    She has no cervical adenopathy.  Neurological: She is oriented to person, place, and time. She exhibits normal muscle tone. Coordination normal.  Skin: No rash noted. No erythema.  Psychiatric: She has a normal mood and affect. Her behavior is normal.     ED Treatments / Results  Labs (all labs ordered are listed, but only abnormal results are displayed) Labs Reviewed - No data to display  EKG  EKG Interpretation None       Radiology Dg Chest 1 View  Result Date: 06/08/2017 CLINICAL DATA:  Fall EXAM: CHEST 1 VIEW COMPARISON:  04/12/2017 FINDINGS: Cardiomegaly. Linear atelectasis in the right lower lung. No confluent opacities otherwise. No  visible effusions or pneumothorax. Compression fractures noted in the mid thoracic spine as seen on prior study. IMPRESSION: Cardiomegaly.  Right base atelectasis. Electronically Signed   By: Rolm Baptise M.D.   On: 06/08/2017 18:12   Dg Pelvis 1-2 Views  Result Date: 06/08/2017 CLINICAL DATA:  Trip and fall this afternoon. EXAM: PELVIS - 1-2 VIEW COMPARISON:  Pelvis and left hip radiographs 03/30/2017 FINDINGS: Left hip hemiarthroplasty in expected alignment. No periprosthetic lucency or fracture. Remote right iliac crest fracture. No evidence of acute fracture. Pubic symphysis and sacroiliac joint are congruent. Diffuse bony under mineralization. Chronic gas just distention of bowel in the pelvis. IMPRESSION: No evidence of acute pelvic fracture. Left hip arthroplasty without evident complication. Electronically Signed   By: Jeb Levering M.D.   On: 06/08/2017 18:10   Ct Head Wo Contrast  Addendum Date: 06/08/2017   ADDENDUM REPORT: 06/08/2017 19:10 ADDENDUM: There is an age-indeterminate, probably acute, new from 03/30/2017, mild compression fracture of T2 vertebral body, with approximately 15 percent height loss. Background of diffuse osteopenia. These results were called by telephone at the time of interpretation on 06/08/2017 at 7:08 pm to Dr. Milton Ferguson , who verbally acknowledged these results. Electronically Signed   By: Fidela Salisbury M.D.   On: 06/08/2017 19:10   Result Date: 06/08/2017 CLINICAL DATA:  Headache and chest pain after falling today. Patient on blood thinners. EXAM: CT HEAD WITHOUT CONTRAST CT CERVICAL SPINE WITHOUT CONTRAST TECHNIQUE: Multidetector CT imaging of the head and cervical spine was performed following the standard protocol without intravenous contrast. Multiplanar CT image reconstructions of the cervical spine were also generated. COMPARISON:  CT of the head 04/24/2013 FINDINGS: CT HEAD FINDINGS Brain: No evidence of acute infarction, hemorrhage, hydrocephalus,  extra-axial collection or mass lesion/mass effect. Advanced brain parenchymal volume loss and periventricular microangiopathy. Vascular: Vascular calcifications at the skullbase. Skull: Normal. Negative for fracture or focal lesion. Sinuses/Orbits: No acute finding. Other: None. CT CERVICAL SPINE FINDINGS Alignment: Exaggerated cervical lordosis. Skull base and vertebrae: No acute fracture. No primary bone lesion or focal pathologic process. Soft tissues and spinal canal: No prevertebral fluid or swelling. No visible canal hematoma. Disc levels: Multilevel osteoarthritic changes and posterior facet arthropathy. Upper chest: Calcific atherosclerotic disease of the aorta. Other: None. IMPRESSION: No acute intracranial abnormality. Marked brain parenchymal atrophy and chronic microvascular disease. No evidence of acute injury to the cervical spine. Multilevel osteoarthritic changes and posterior facet arthropathy with exaggerated cervical lordosis.  Electronically Signed: By: Fidela Salisbury M.D. On: 06/08/2017 18:07   Ct Cervical Spine Wo Contrast  Addendum Date: 06/08/2017   ADDENDUM REPORT: 06/08/2017 19:10 ADDENDUM: There is an age-indeterminate, probably acute, new from 03/30/2017, mild compression fracture of T2 vertebral body, with approximately 15 percent height loss. Background of diffuse osteopenia. These results were called by telephone at the time of interpretation on 06/08/2017 at 7:08 pm to Dr. Milton Ferguson , who verbally acknowledged these results. Electronically Signed   By: Fidela Salisbury M.D.   On: 06/08/2017 19:10   Result Date: 06/08/2017 CLINICAL DATA:  Headache and chest pain after falling today. Patient on blood thinners. EXAM: CT HEAD WITHOUT CONTRAST CT CERVICAL SPINE WITHOUT CONTRAST TECHNIQUE: Multidetector CT imaging of the head and cervical spine was performed following the standard protocol without intravenous contrast. Multiplanar CT image reconstructions of the cervical spine  were also generated. COMPARISON:  CT of the head 04/24/2013 FINDINGS: CT HEAD FINDINGS Brain: No evidence of acute infarction, hemorrhage, hydrocephalus, extra-axial collection or mass lesion/mass effect. Advanced brain parenchymal volume loss and periventricular microangiopathy. Vascular: Vascular calcifications at the skullbase. Skull: Normal. Negative for fracture or focal lesion. Sinuses/Orbits: No acute finding. Other: None. CT CERVICAL SPINE FINDINGS Alignment: Exaggerated cervical lordosis. Skull base and vertebrae: No acute fracture. No primary bone lesion or focal pathologic process. Soft tissues and spinal canal: No prevertebral fluid or swelling. No visible canal hematoma. Disc levels: Multilevel osteoarthritic changes and posterior facet arthropathy. Upper chest: Calcific atherosclerotic disease of the aorta. Other: None. IMPRESSION: No acute intracranial abnormality. Marked brain parenchymal atrophy and chronic microvascular disease. No evidence of acute injury to the cervical spine. Multilevel osteoarthritic changes and posterior facet arthropathy with exaggerated cervical lordosis. Electronically Signed: By: Fidela Salisbury M.D. On: 06/08/2017 18:07   Dg Knee Complete 4 Views Right  Result Date: 06/08/2017 CLINICAL DATA:  Trip and fall this afternoon with right knee pain. EXAM: RIGHT KNEE - COMPLETE 4+ VIEW COMPARISON:  None. FINDINGS: No acute fracture or subluxation. No knee joint effusion. Mild medial tibiofemoral joint space narrowing. Trace tricompartmental peripheral spurring. No focal soft tissue abnormality. There are vascular calcifications. IMPRESSION: No fracture or subluxation of the right knee. Electronically Signed   By: Jeb Levering M.D.   On: 06/08/2017 18:11    Procedures .Marland KitchenLaceration Repair Date/Time: 06/08/2017 7:51 PM Performed by: Milton Ferguson Authorized by: Milton Ferguson   Comments:     Patient with a superficial 2 cm laceration to occipital head. Area was  cleaned thoroughly with Betadine. Nothing use for anesthesia. Area was closed with 3 staples. Patient tolerated the procedure well   (including critical care time)  Medications Ordered in ED Medications  acetaminophen (TYLENOL) tablet 650 mg (not administered)     Initial Impression / Assessment and Plan / ED Course  I have reviewed the triage vital signs and the nursing notes.  Pertinent labs & imaging results that were available during my care of the patient were reviewed by me and considered in my medical decision making (see chart for details).     Patient with a fall with contusion to right knee contusion to chest laceration to occipital head. 2 cm laceration" 3 staples  Final Clinical Impressions(s) / ED Diagnoses   Final diagnoses:  Fall, initial encounter    New Prescriptions New Prescriptions   No medications on file     Milton Ferguson, MD 06/08/17 1952

## 2017-06-08 NOTE — ED Notes (Signed)
[place]  William. Pt.'s son

## 2017-06-08 NOTE — ED Triage Notes (Addendum)
Pt reports tripped and fell this afternoon. Pt not sure of exact events related to fall. Pt alert and oriented to self.  EMS reports pt denies loc. Pt family denies pt being on blood thinners at this time. Pt reports headache, chest pain that is worse with palpation, and right hip. No obvious deformity noted. c-collar in tact at time of arrival. Pt able to follow commands.

## 2017-06-08 NOTE — ED Notes (Signed)
Pt to xray

## 2017-06-08 NOTE — Discharge Instructions (Signed)
Clean laceration twice a day gently with soap and water. Follow up with her doctor in 7-10 days to get staples out

## 2017-06-08 NOTE — ED Notes (Signed)
Pt back from x-ray.

## 2017-06-14 ENCOUNTER — Emergency Department (HOSPITAL_COMMUNITY)
Admission: EM | Admit: 2017-06-14 | Discharge: 2017-06-14 | Disposition: A | Discharge status: Home / Self Care | Payer: Medicare Other | Attending: Emergency Medicine | Admitting: Emergency Medicine

## 2017-06-14 ENCOUNTER — Encounter (HOSPITAL_COMMUNITY): Payer: Self-pay | Admitting: Cardiology

## 2017-06-14 ENCOUNTER — Emergency Department (HOSPITAL_COMMUNITY): Payer: Medicare Other

## 2017-06-14 DIAGNOSIS — Y999 Unspecified external cause status: Secondary | ICD-10-CM | POA: Insufficient documentation

## 2017-06-14 DIAGNOSIS — S72114A Nondisplaced fracture of greater trochanter of right femur, initial encounter for closed fracture: Secondary | ICD-10-CM | POA: Diagnosis not present

## 2017-06-14 DIAGNOSIS — S79911A Unspecified injury of right hip, initial encounter: Secondary | ICD-10-CM | POA: Diagnosis present

## 2017-06-14 DIAGNOSIS — I10 Essential (primary) hypertension: Secondary | ICD-10-CM | POA: Insufficient documentation

## 2017-06-14 DIAGNOSIS — R279 Unspecified lack of coordination: Secondary | ICD-10-CM | POA: Diagnosis not present

## 2017-06-14 DIAGNOSIS — Z791 Long term (current) use of non-steroidal anti-inflammatories (NSAID): Secondary | ICD-10-CM | POA: Diagnosis not present

## 2017-06-14 DIAGNOSIS — Z79899 Other long term (current) drug therapy: Secondary | ICD-10-CM | POA: Diagnosis not present

## 2017-06-14 DIAGNOSIS — Y929 Unspecified place or not applicable: Secondary | ICD-10-CM | POA: Diagnosis not present

## 2017-06-14 DIAGNOSIS — Y9301 Activity, walking, marching and hiking: Secondary | ICD-10-CM | POA: Insufficient documentation

## 2017-06-14 DIAGNOSIS — S72111A Displaced fracture of greater trochanter of right femur, initial encounter for closed fracture: Secondary | ICD-10-CM | POA: Diagnosis not present

## 2017-06-14 DIAGNOSIS — E039 Hypothyroidism, unspecified: Secondary | ICD-10-CM | POA: Diagnosis not present

## 2017-06-14 DIAGNOSIS — Z7401 Bed confinement status: Secondary | ICD-10-CM | POA: Diagnosis not present

## 2017-06-14 DIAGNOSIS — X509XXA Other and unspecified overexertion or strenuous movements or postures, initial encounter: Secondary | ICD-10-CM | POA: Diagnosis not present

## 2017-06-14 DIAGNOSIS — R0789 Other chest pain: Secondary | ICD-10-CM | POA: Diagnosis not present

## 2017-06-14 DIAGNOSIS — M79606 Pain in leg, unspecified: Secondary | ICD-10-CM | POA: Diagnosis not present

## 2017-06-14 DIAGNOSIS — W19XXXA Unspecified fall, initial encounter: Secondary | ICD-10-CM

## 2017-06-14 LAB — CBC WITH DIFFERENTIAL/PLATELET
BASOS ABS: 0 10*3/uL (ref 0.0–0.1)
BASOS PCT: 1 %
EOS ABS: 0.2 10*3/uL (ref 0.0–0.7)
Eosinophils Relative: 3 %
HCT: 36.8 % (ref 36.0–46.0)
Hemoglobin: 12.6 g/dL (ref 12.0–15.0)
Lymphocytes Relative: 19 %
Lymphs Abs: 1.3 10*3/uL (ref 0.7–4.0)
MCH: 31.8 pg (ref 26.0–34.0)
MCHC: 34.2 g/dL (ref 30.0–36.0)
MCV: 92.9 fL (ref 78.0–100.0)
MONO ABS: 0.5 10*3/uL (ref 0.1–1.0)
Monocytes Relative: 7 %
Neutro Abs: 4.9 10*3/uL (ref 1.7–7.7)
Neutrophils Relative %: 70 %
PLATELETS: 208 10*3/uL (ref 150–400)
RBC: 3.96 MIL/uL (ref 3.87–5.11)
RDW: 14.5 % (ref 11.5–15.5)
WBC: 7 10*3/uL (ref 4.0–10.5)

## 2017-06-14 LAB — BASIC METABOLIC PANEL
ANION GAP: 10 (ref 5–15)
BUN: 24 mg/dL — ABNORMAL HIGH (ref 6–20)
CALCIUM: 8.9 mg/dL (ref 8.9–10.3)
CO2: 24 mmol/L (ref 22–32)
Chloride: 106 mmol/L (ref 101–111)
Creatinine, Ser: 0.6 mg/dL (ref 0.44–1.00)
GLUCOSE: 103 mg/dL — AB (ref 65–99)
Potassium: 3.6 mmol/L (ref 3.5–5.1)
SODIUM: 140 mmol/L (ref 135–145)

## 2017-06-14 MED ORDER — MORPHINE SULFATE (PF) 4 MG/ML IV SOLN
2.0000 mg | Freq: Once | INTRAVENOUS | Status: AC
  Administered 2017-06-14: 2 mg via INTRAVENOUS
  Filled 2017-06-14: qty 1

## 2017-06-14 MED ORDER — OXYCODONE-ACETAMINOPHEN 5-325 MG PO TABS: 1.0000 | ORAL_TABLET | Freq: Four times a day (QID) | ORAL | 0 refills | Status: AC | PRN

## 2017-06-14 NOTE — ED Provider Notes (Signed)
[facility] DEPT Provider Note   CSN: [id number] Arrival date & time: 06/14/17  1414     History   Chief Complaint Chief Complaint  Patient presents with  . Leg Pain    HPI DAVID TOWSON is a 83 y.o. female.  The history is provided by the patient.  Leg Pain   This is a new problem. The current episode started 1 to 2 hours ago. The problem occurs constantly. The problem has not changed since onset.The pain is present in the left hip and right hip. The pain is severe. She has tried arthritis medications for the symptoms. There has been no history of extremity trauma.   83 year old female who presents with bilateral hip pain. She has a history of left hip arthroplasty and previous fracture of the left knee. History is provided by patient's son who states that she was helping patient ambulate from the bathroom with a walker. He heard a pop in her right hip, and she collapsed in his arms. Since then she has not been able to ambulate. He denies any fall, head strike, or loss of consciousness. States that from previous fall she has been complaining of chest wall soreness for past several weeks, but that is unchanged. No difficulty breathing, no recent illnesses.  Past Medical History:  Diagnosis Date  . Anxiety   . Celiac disease   . Chest pain 09/21/2011   OCCASIONAL  . Constipation   . Dementia   . Difficulty sleeping    TAKES MED TO SLEEP  . Dysphagia 09/21/2011  . Dysrhythmia    "heart arrthymia" per son, but unsure what kind  . GERD (gastroesophageal reflux disease)   . HTN (hypertension)   . Hypothyroidism   . Knee fracture, left 03/2012  . Osteopenia   . Ovarian mass   . 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 Schizophrenia ([place])    FAMILY UNSURE OF THIS    Patient Active Problem List   Diagnosis Date Noted  . Major depression, recurrent ([place]) 04/12/2017  . Back pain 03/30/2017  . Dementia without behavioral disturbance 03/30/2017  . Constipation 08/18/2015  .  Mass, ovarian 11/24/2013  . Esophagitis due to drug 10/19/2013  . Colon cancer screening 10/19/2013  . Bradycardia 06/05/2012  . Fracture of patella, left, closed 04/01/2012  . Fractured pelvis ([place]) 11/19/2011  . Celiac disease 11/13/2011  . Weakness generalized 09/27/2011  . Hyponatremia 09/27/2011  . 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 Dehydration 09/27/2011  . Anemia 09/21/2011  . Fall 09/21/2011  . Pelvic fracture ([place]) 09/21/2011  . Emphysema 09/21/2011  . Dysphagia 09/21/2011  . Chest pain 09/21/2011  . SIADH (syndrome of inappropriate ADH production) ([place]) 09/21/2011  . Hematoma 09/21/2011  . GERD (gastroesophageal reflux disease) 09/21/2011  . Hypertension 09/20/2011  . Hypothyroidism 09/20/2011  . Depression 09/20/2011  . Headache(784.0) 09/20/2011    Past Surgical History:  Procedure Laterality Date  . ESOPHAGOGASTRODUODENOSCOPY  09/27/2011   Inflammatory changes midesophagus most likely pill-induced injury (Fosamax), large diaphragmatic hernia, biopsies from small bowel consistent with celiac disease  . ESOPHAGOGASTRODUODENOSCOPY (EGD) WITH ESOPHAGEAL DILATION N/A 07/13/2013   NWG:NFAOZH esophagitis-more likely pill-induced s/p esophageal biopsy. Noncritical. Schatzki's ring s/p passage of a Maloney dilator. Hiatal hernia. Abnormal duodenum consistent with prior diagnosis of celiac disease. Status post 80 French Maloney dilator. Esophageal biopsies benign showed marked acute and chronic inflammation.  Marland Kitchen LAPAROSCOPIC SALPINGO OOPHERECTOMY Bilateral 11/16/2013   Procedure: LAPAROSCOPIC BILATERAL SALPINGO OOPHORECTOMY;  Surgeon: Imagene Gurney A. Alycia Rossetti, MD;  Location: [facility] ORS;  Service: Gynecology;  Laterality: Bilateral;  . left hip replacement    . left shoulder    . THYROIDECTOMY    . VERTEBROPLASTY      OB History    No data available       Home Medications    Prior to Admission medications   Medication Sig Start Date End Date Taking? Authorizing Provider    acetaminophen (TYLENOL) 500 MG tablet Take 1,000 mg by mouth every 6 (six) hours as needed for mild pain, moderate pain or headache.   Yes [provider]  diclofenac (VOLTAREN) 50 MG EC tablet Take 50 mg by mouth daily.    Yes [provider]  escitalopram (LEXAPRO) 10 MG tablet Take 1 tablet (10 mg total) by mouth daily. 04/15/17  Yes Patrecia Pour, NP  Iron-FA-B Cmp-C-Biot-Probiotic (FUSION PLUS PO) Take 1 capsule by mouth daily.    Yes [provider]  levothyroxine (SYNTHROID, LEVOTHROID) 75 MCG tablet Take 75 mcg by mouth every morning.   Yes [provider]  magnesium hydroxide (MILK OF MAGNESIA) 400 MG/5ML suspension Take 10 mLs by mouth daily as needed for mild constipation.   Yes [provider]  mirtazapine (REMERON) 15 MG tablet Take 15 mg by mouth at bedtime.  04/08/17  Yes [provider]  omeprazole (PRILOSEC) 20 MG capsule TAKE ONE CAPSULE BY MOUTH TWICE DAILY. 05/29/17  Yes Carlis Stable, NP  oxyCODONE-acetaminophen (PERCOCET/ROXICET) 5-325 MG tablet Take 1 tablet by mouth every 4 (four) hours as needed for severe pain. Patient taking differently: Take 1 tablet by mouth every 8 (eight) hours as needed for severe pain.  04/14/17  Yes Jola Schmidt, MD  polyethylene glycol powder (MIRALAX) powder Take 17 g by mouth daily.   Yes [provider]  QUEtiapine (SEROQUEL) 200 MG tablet Take 200 mg by mouth at bedtime.   Yes [provider]  QUEtiapine (SEROQUEL) 50 MG tablet Take 1 tablet (50 mg total) by mouth at bedtime. 04/14/17  Yes Patrecia Pour, NP  senna (SENOKOT) 8.6 MG TABS tablet Take 3 tablets by mouth daily.   Yes [provider]  oxyCODONE-acetaminophen (ROXICET) 5-325 MG tablet Take 1-2 tablets by mouth every 6 (six) hours as needed for moderate pain or severe pain. 06/14/17   Forde Dandy, MD    Family History Family History  Problem Relation Age of Onset  . Cancer Sister        brain tumor  .  Hypertension Daughter   . Kidney disease Sister   . Hypertension Brother     Social History Social History  Substance Use Topics  . Smoking status: Never Smoker  . Smokeless tobacco: Never Used  . Alcohol use No     Comment: rarely     Allergies   Gluten   Review of Systems Review of Systems  Respiratory: Negative for shortness of breath.   Cardiovascular: Negative for chest pain.  Gastrointestinal: Negative for abdominal pain.  Musculoskeletal:       Bilateral hip pain   Skin: Negative for wound.  Hematological: Does not bruise/bleed easily.  All other systems reviewed and are negative.    Physical Exam Updated Vital Signs BP (!) 149/88   Pulse 75   Temp 98.2 F (36.8 C) (Oral)   Resp 18   SpO2 94%   Physical Exam Physical Exam  Nursing note and vitals reviewed. Constitutional: Well developed, well nourished, non-toxic, and in no acute distress Head: Normocephalic and atraumatic.  Mouth/Throat: Oropharynx is clear and moist.  Neck: Normal range of motion. Neck supple.  Cardiovascular: Normal rate and regular rhythm.   +2 DP pulses bilaterally Pulmonary/Chest: Effort normal and breath sounds normal.  Abdominal: Soft. There is no tenderness. There is no rebound and no guarding.  Musculoskeletal: Normal range of motion of all 4 extremities. No deformities. mild reported tenderness with ROM of the right hip behind the buttock Neurological: Alert, no facial droop, fluent speech, moves all extremities symmetrically, full strength ankle dorsi and plantar flexion bilaterally, sensation to light touch intact throughout in all 4 extremities. Skin: Skin is warm and dry.  Psychiatric: Cooperative   ED Treatments / Results  Labs (all labs ordered are listed, but only abnormal results are displayed) Labs Reviewed  BASIC METABOLIC PANEL - Abnormal; Notable for the following:       Result Value   Glucose, Bld 103 (*)    BUN 24 (*)    All other components within  normal limits  CBC WITH DIFFERENTIAL/PLATELET    EKG  EKG Interpretation None       Radiology Dg Chest 1 View  Result Date: 06/14/2017 CLINICAL DATA:  Chest soreness. EXAM: CHEST 1 VIEW COMPARISON:  06/08/2017 FINDINGS: Rotated film. Lower face obscures the upper right lung. Limited evaluation shows apparent hyperexpansion. Cardiomegaly evident. No overt pulmonary edema. No focal airspace consolidation is visible. No pleural effusion. Bones are diffusely demineralized. Old right clavicle fracture noted. Diffuse gaseous bowel dilatation noted in the visualized upper abdomen. IMPRESSION: Study limited by rotation and positioning. Cardiomegaly with apparent hyperexpansion and likely chronic interstitial changes. Diffuse gaseous bowel distention in the visualized upper abdomen. Electronically Signed   By: Misty Stanley M.D.   On: 06/14/2017 15:43   Dg Hips Bilat W Or Wo Pelvis 3-4 Views  Result Date: 06/14/2017 CLINICAL DATA:  Pop in the right hip.  Bilateral hip pain. EXAM: DG HIP (WITH OR WITHOUT PELVIS) 3-4V BILAT COMPARISON:  Pelvis radiograph 06/08/2017 FINDINGS: Avulsion fracture at the greater trochanter on the right. No neck or intertrochanteric continuation is seen. Remote right inferior pubic ramus fracture. Left hip hemiarthroplasty is located. No periprosthetic fracture noted. Remote fracture of the right iliac wing. Prominent osteopenia. IMPRESSION: Greater trochanter avulsion fracture on the right. Electronically Signed   By: Monte Fantasia M.D.   On: 06/14/2017 15:41    Procedures Procedures (including critical care time)  Medications Ordered in ED Medications  morphine 4 MG/ML injection 2 mg (2 mg Intravenous Given 06/14/17 1717)     Initial Impression / Assessment and Plan / ED Course  I have reviewed the triage vital signs and the nursing notes.  Pertinent labs & imaging results that were available during my care of the patient were reviewed by me and considered in my  medical decision making (see chart for details).    83 year old female who presents with bilateral eye pain after a sudden pop on ambulating. He is nontoxic in no acute distress. Did receive Percocet prior to arrival, and her pain is better controlled and she is able to have range of motion of bilateral hips and knees without significant distress. X-rays visualized and she does have avulsion fracture of the greater trochanter of the right femur. This is nonsurgical, protected weightbearing as tolerated, and outpatient follow-up with orthopedic surgery. Family did express concern due to her inability to ambulate up the steps in their home. Family also concerned that they may not be able to care for her and assist her  at home. Briefly discussed this with Dr. Nehemiah Settle about potential admission for observation, but with observation stay, does not qualify for short-term rehabilitation placement. Discussed with family. Her pain has been better controlled after 2 mg of morphine here. Has been able to bear weight with assistance. Discussed with the patient's son who felt comfortable with discharge with home health. Mouth has been ordered for her. SW also ordered for potential placement as outpatient for rehabilitation facility. Strict return and follow-up instructions reviewed. She and son expressed understanding of all discharge instructions and felt comfortable with the plan of care.   Final Clinical Impressions(s) / ED Diagnoses   Final diagnoses:  Closed avulsion fracture of greater trochanter of right femur, initial encounter (HCC)    New Prescriptions New Prescriptions   OXYCODONE-ACETAMINOPHEN (ROXICET) 5-325 MG TABLET    Take 1-2 tablets by mouth every 6 (six) hours as needed for moderate pain or severe pain.     Forde Dandy, MD 06/14/17 Tresa Moore

## 2017-06-14 NOTE — Discharge Instructions (Signed)
You have a chip fracture of the greater trochanter of the right hip. You can do protected weight bearing on the right leg as tolerated with assistance. Please follow-up with Dr. Aline Brochure to arrange orthopedic surgery follow-up.   You have been ordered home health, for both nursing care, healthy, physical therapy and social worker.  Please return without fail for worsening symptoms, including escalating pain, confusion, or any other symptoms concerning to you.

## 2017-06-14 NOTE — ED Triage Notes (Addendum)
Son was assisting pt to the bathroom and heard a pop in her right leg.  Per son pt unable to ambulate now.  Pt denies any leg pain.   Pt does c/o chest soreness-per son,  chest soreness is normal for pt.  Pt had a fall 06/08/2017 and has staples  to back of head.

## 2017-06-14 NOTE — ED Notes (Signed)
Ambulated with difficulty, unable to bare weight without help

## 2017-06-16 ENCOUNTER — Other Ambulatory Visit: Payer: Self-pay | Admitting: *Deleted

## 2017-06-17 ENCOUNTER — Encounter: Payer: Self-pay | Admitting: *Deleted

## 2017-06-17 DIAGNOSIS — K219 Gastro-esophageal reflux disease without esophagitis: Secondary | ICD-10-CM | POA: Diagnosis not present

## 2017-06-17 DIAGNOSIS — F209 Schizophrenia, unspecified: Secondary | ICD-10-CM | POA: Diagnosis not present

## 2017-06-17 DIAGNOSIS — C049 Malignant neoplasm of floor of mouth, unspecified: Secondary | ICD-10-CM | POA: Diagnosis not present

## 2017-06-17 DIAGNOSIS — F0390 Unspecified dementia without behavioral disturbance: Secondary | ICD-10-CM | POA: Diagnosis not present

## 2017-06-17 DIAGNOSIS — M818 Other osteoporosis without current pathological fracture: Secondary | ICD-10-CM | POA: Diagnosis not present

## 2017-06-17 DIAGNOSIS — E039 Hypothyroidism, unspecified: Secondary | ICD-10-CM | POA: Diagnosis not present

## 2017-06-17 DIAGNOSIS — S0101XD Laceration without foreign body of scalp, subsequent encounter: Secondary | ICD-10-CM | POA: Diagnosis not present

## 2017-06-17 DIAGNOSIS — Z96642 Presence of left artificial hip joint: Secondary | ICD-10-CM | POA: Diagnosis not present

## 2017-06-17 DIAGNOSIS — J439 Emphysema, unspecified: Secondary | ICD-10-CM | POA: Diagnosis not present

## 2017-06-17 DIAGNOSIS — R1310 Dysphagia, unspecified: Secondary | ICD-10-CM | POA: Diagnosis not present

## 2017-06-17 DIAGNOSIS — I499 Cardiac arrhythmia, unspecified: Secondary | ICD-10-CM | POA: Diagnosis not present

## 2017-06-17 DIAGNOSIS — D509 Iron deficiency anemia, unspecified: Secondary | ICD-10-CM | POA: Diagnosis not present

## 2017-06-17 DIAGNOSIS — F339 Major depressive disorder, recurrent, unspecified: Secondary | ICD-10-CM | POA: Diagnosis not present

## 2017-06-17 DIAGNOSIS — W19XXXD Unspecified fall, subsequent encounter: Secondary | ICD-10-CM | POA: Diagnosis not present

## 2017-06-17 DIAGNOSIS — E782 Mixed hyperlipidemia: Secondary | ICD-10-CM | POA: Diagnosis not present

## 2017-06-17 DIAGNOSIS — G459 Transient cerebral ischemic attack, unspecified: Secondary | ICD-10-CM | POA: Diagnosis not present

## 2017-06-17 DIAGNOSIS — Z4802 Encounter for removal of sutures: Secondary | ICD-10-CM | POA: Diagnosis not present

## 2017-06-17 DIAGNOSIS — I10 Essential (primary) hypertension: Secondary | ICD-10-CM | POA: Diagnosis not present

## 2017-06-17 DIAGNOSIS — S72111D Displaced fracture of greater trochanter of right femur, subsequent encounter for closed fracture with routine healing: Secondary | ICD-10-CM | POA: Diagnosis not present

## 2017-06-17 DIAGNOSIS — M8580 Other specified disorders of bone density and structure, unspecified site: Secondary | ICD-10-CM | POA: Diagnosis not present

## 2017-06-17 NOTE — Patient Outreach (Signed)
[facility]) Care Management  06/17/2017  Felicia Harvey Sep 09, 1929 [id number]   RN Health Coach telephone call to patient.  [employer] compliance verified. RN spoke with son. Referral:  From [facility] ED census list  RN described services available from [facility] services. Per son declined services at this time but was very interested in receiving brochure and considering  services for the future.   Plan: RN Health Coach will send brochure to patient family  [facility] [phone]

## 2017-06-18 ENCOUNTER — Emergency Department (HOSPITAL_COMMUNITY): Payer: Medicare Other

## 2017-06-18 ENCOUNTER — Emergency Department (HOSPITAL_COMMUNITY)
Admission: EM | Admit: 2017-06-18 | Discharge: 2017-06-18 | Disposition: A | Discharge status: Home / Self Care | Payer: Medicare Other | Attending: Emergency Medicine | Admitting: Emergency Medicine

## 2017-06-18 ENCOUNTER — Encounter (HOSPITAL_COMMUNITY): Payer: Self-pay | Admitting: Emergency Medicine

## 2017-06-18 DIAGNOSIS — W19XXXA Unspecified fall, initial encounter: Secondary | ICD-10-CM | POA: Insufficient documentation

## 2017-06-18 DIAGNOSIS — Z96642 Presence of left artificial hip joint: Secondary | ICD-10-CM | POA: Insufficient documentation

## 2017-06-18 DIAGNOSIS — M25522 Pain in left elbow: Secondary | ICD-10-CM | POA: Diagnosis not present

## 2017-06-18 DIAGNOSIS — Z8781 Personal history of (healed) traumatic fracture: Secondary | ICD-10-CM | POA: Diagnosis not present

## 2017-06-18 DIAGNOSIS — R1310 Dysphagia, unspecified: Secondary | ICD-10-CM | POA: Diagnosis not present

## 2017-06-18 DIAGNOSIS — M5489 Other dorsalgia: Secondary | ICD-10-CM | POA: Diagnosis not present

## 2017-06-18 DIAGNOSIS — S0990XA Unspecified injury of head, initial encounter: Secondary | ICD-10-CM | POA: Diagnosis not present

## 2017-06-18 DIAGNOSIS — R296 Repeated falls: Secondary | ICD-10-CM | POA: Diagnosis not present

## 2017-06-18 DIAGNOSIS — Z79891 Long term (current) use of opiate analgesic: Secondary | ICD-10-CM | POA: Diagnosis not present

## 2017-06-18 DIAGNOSIS — S0101XD Laceration without foreign body of scalp, subsequent encounter: Secondary | ICD-10-CM | POA: Diagnosis not present

## 2017-06-18 DIAGNOSIS — E039 Hypothyroidism, unspecified: Secondary | ICD-10-CM | POA: Diagnosis not present

## 2017-06-18 DIAGNOSIS — S51012A Laceration without foreign body of left elbow, initial encounter: Secondary | ICD-10-CM | POA: Diagnosis not present

## 2017-06-18 DIAGNOSIS — I10 Essential (primary) hypertension: Secondary | ICD-10-CM | POA: Insufficient documentation

## 2017-06-18 DIAGNOSIS — Z23 Encounter for immunization: Secondary | ICD-10-CM | POA: Diagnosis not present

## 2017-06-18 DIAGNOSIS — Z79899 Other long term (current) drug therapy: Secondary | ICD-10-CM | POA: Diagnosis not present

## 2017-06-18 DIAGNOSIS — S72111D Displaced fracture of greater trochanter of right femur, subsequent encounter for closed fracture with routine healing: Secondary | ICD-10-CM | POA: Diagnosis not present

## 2017-06-18 DIAGNOSIS — T148XXA Other injury of unspecified body region, initial encounter: Secondary | ICD-10-CM | POA: Diagnosis not present

## 2017-06-18 DIAGNOSIS — Z4802 Encounter for removal of sutures: Secondary | ICD-10-CM | POA: Diagnosis not present

## 2017-06-18 DIAGNOSIS — F05 Delirium due to known physiological condition: Secondary | ICD-10-CM | POA: Diagnosis not present

## 2017-06-18 DIAGNOSIS — M542 Cervicalgia: Secondary | ICD-10-CM | POA: Diagnosis not present

## 2017-06-18 DIAGNOSIS — J439 Emphysema, unspecified: Secondary | ICD-10-CM | POA: Diagnosis not present

## 2017-06-18 DIAGNOSIS — M25551 Pain in right hip: Secondary | ICD-10-CM | POA: Insufficient documentation

## 2017-06-18 DIAGNOSIS — Z743 Need for continuous supervision: Secondary | ICD-10-CM | POA: Diagnosis not present

## 2017-06-18 DIAGNOSIS — F0390 Unspecified dementia without behavioral disturbance: Secondary | ICD-10-CM | POA: Diagnosis not present

## 2017-06-18 MED ORDER — TETANUS-DIPHTH-ACELL PERTUSSIS 5-2.5-18.5 LF-MCG/0.5 IM SUSP
0.5000 mL | Freq: Once | INTRAMUSCULAR | Status: AC
  Administered 2017-06-18: 0.5 mL via INTRAMUSCULAR
  Filled 2017-06-18: qty 0.5

## 2017-06-18 MED ORDER — OXYCODONE-ACETAMINOPHEN 5-325 MG PO TABS
1.0000 | ORAL_TABLET | Freq: Once | ORAL | Status: AC
  Administered 2017-06-18: 1 via ORAL
  Filled 2017-06-18: qty 1

## 2017-06-18 NOTE — ED Triage Notes (Signed)
Pt reports falling at home, denies loc. C/o upper back pain. Pt found in floor by son, down approximately 15 minutes.

## 2017-06-18 NOTE — Discharge Instructions (Signed)
Follow up with dr. Aline Brochure or contact dr. Nevada Crane.  Ambulate with a walker and with help as tolerated

## 2017-06-19 DIAGNOSIS — J439 Emphysema, unspecified: Secondary | ICD-10-CM | POA: Diagnosis not present

## 2017-06-19 DIAGNOSIS — S72111D Displaced fracture of greater trochanter of right femur, subsequent encounter for closed fracture with routine healing: Secondary | ICD-10-CM | POA: Diagnosis not present

## 2017-06-19 DIAGNOSIS — R1310 Dysphagia, unspecified: Secondary | ICD-10-CM | POA: Diagnosis not present

## 2017-06-19 DIAGNOSIS — F0390 Unspecified dementia without behavioral disturbance: Secondary | ICD-10-CM | POA: Diagnosis not present

## 2017-06-19 DIAGNOSIS — Z4802 Encounter for removal of sutures: Secondary | ICD-10-CM | POA: Diagnosis not present

## 2017-06-19 DIAGNOSIS — S0101XD Laceration without foreign body of scalp, subsequent encounter: Secondary | ICD-10-CM | POA: Diagnosis not present

## 2017-06-20 DIAGNOSIS — J439 Emphysema, unspecified: Secondary | ICD-10-CM | POA: Diagnosis not present

## 2017-06-20 DIAGNOSIS — Z4802 Encounter for removal of sutures: Secondary | ICD-10-CM | POA: Diagnosis not present

## 2017-06-20 DIAGNOSIS — F0390 Unspecified dementia without behavioral disturbance: Secondary | ICD-10-CM | POA: Diagnosis not present

## 2017-06-20 DIAGNOSIS — R1310 Dysphagia, unspecified: Secondary | ICD-10-CM | POA: Diagnosis not present

## 2017-06-20 DIAGNOSIS — S0101XD Laceration without foreign body of scalp, subsequent encounter: Secondary | ICD-10-CM | POA: Diagnosis not present

## 2017-06-20 DIAGNOSIS — S72111D Displaced fracture of greater trochanter of right femur, subsequent encounter for closed fracture with routine healing: Secondary | ICD-10-CM | POA: Diagnosis not present

## 2017-06-20 NOTE — ED Provider Notes (Addendum)
[facility] DEPT Provider Note   CSN: [id number] Arrival date & time: 06/18/17  1312     History   Chief Complaint Chief Complaint  Patient presents with  . Fall    HPI Felicia Harvey is a 83 y.o. female.  Patient had a fall no loss of consciousness. Patient complains of some right hip pain. She has a history of avulsion fracture of right greater trochanter.   The history is provided by the patient. No language interpreter was used.  Fall  This is a recurrent problem. The current episode started 6 to 12 hours ago. The problem occurs every several days. The problem has been resolved. Pertinent negatives include no chest pain, no abdominal pain and no headaches. Exacerbated by: Movement of hip. She has tried nothing for the symptoms. The treatment provided no relief.    Past Medical History:  Diagnosis Date  . Anxiety   . Celiac disease   . Chest pain 09/21/2011   OCCASIONAL  . Constipation   . Dementia   . Difficulty sleeping    TAKES MED TO SLEEP  . Dysphagia 09/21/2011  . Dysrhythmia    "heart arrthymia" per son, but unsure what kind  . GERD (gastroesophageal reflux disease)   . HTN (hypertension)   . Hypothyroidism   . Knee fracture, left 03/2012  . Osteopenia   . Ovarian mass   . 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 Schizophrenia ([place])    FAMILY UNSURE OF THIS    Patient Active Problem List   Diagnosis Date Noted  . Major depression, recurrent ([place]) 04/12/2017  . Back pain 03/30/2017  . Dementia without behavioral disturbance 03/30/2017  . Constipation 08/18/2015  . Mass, ovarian 11/24/2013  . Esophagitis due to drug 10/19/2013  . Colon cancer screening 10/19/2013  . Bradycardia 06/05/2012  . Fracture of patella, left, closed 04/01/2012  . Fractured pelvis ([place]) 11/19/2011  . Celiac disease 11/13/2011  . Weakness generalized 09/27/2011  . Hyponatremia 09/27/2011  . 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 Dehydration 09/27/2011  . Anemia 09/21/2011    . Fall 09/21/2011  . Pelvic fracture ([place]) 09/21/2011  . Emphysema 09/21/2011  . Dysphagia 09/21/2011  . Chest pain 09/21/2011  . SIADH (syndrome of inappropriate ADH production) ([place]) 09/21/2011  . Hematoma 09/21/2011  . GERD (gastroesophageal reflux disease) 09/21/2011  . Hypertension 09/20/2011  . Hypothyroidism 09/20/2011  . Depression 09/20/2011  . Headache(784.0) 09/20/2011    Past Surgical History:  Procedure Laterality Date  . ESOPHAGOGASTRODUODENOSCOPY  09/27/2011   Inflammatory changes midesophagus most likely pill-induced injury (Fosamax), large diaphragmatic hernia, biopsies from small bowel consistent with celiac disease  . ESOPHAGOGASTRODUODENOSCOPY (EGD) WITH ESOPHAGEAL DILATION N/A 07/13/2013   QJJ:HERDEY esophagitis-more likely pill-induced s/p esophageal biopsy. Noncritical. Schatzki's ring s/p passage of a Maloney dilator. Hiatal hernia. Abnormal duodenum consistent with prior diagnosis of celiac disease. Status post 26 French Maloney dilator. Esophageal biopsies benign showed marked acute and chronic inflammation.  Marland Kitchen LAPAROSCOPIC SALPINGO OOPHERECTOMY Bilateral 11/16/2013   Procedure: LAPAROSCOPIC BILATERAL SALPINGO OOPHORECTOMY;  Surgeon: Imagene Gurney A. Alycia Rossetti, MD;  Location: [facility] ORS;  Service: Gynecology;  Laterality: Bilateral;  . left hip replacement    . left shoulder    . THYROIDECTOMY    . VERTEBROPLASTY      OB History    No data available       Home Medications    Prior to Admission medications   Medication Sig Start Date End Date Taking? Authorizing Provider  diclofenac (VOLTAREN) 50 MG EC tablet Take  50 mg by mouth daily.    Yes [provider]  escitalopram (LEXAPRO) 10 MG tablet Take 1 tablet (10 mg total) by mouth daily. 04/15/17  Yes Lord, Asa Saunas, NP  levothyroxine (SYNTHROID, LEVOTHROID) 75 MCG tablet Take 75 mcg by mouth every morning.   Yes [provider]  magnesium hydroxide (MILK OF MAGNESIA) 400 MG/5ML suspension Take 10  mLs by mouth daily as needed for mild constipation.   Yes [provider]  mirtazapine (REMERON) 15 MG tablet Take 15 mg by mouth at bedtime.  04/08/17  Yes [provider]  Multiple Vitamin (MULTIVITAMIN) tablet Take 1 tablet by mouth daily.   Yes [provider]  omeprazole (PRILOSEC) 20 MG capsule TAKE ONE CAPSULE BY MOUTH TWICE DAILY. 05/29/17  Yes Carlis Stable, NP  oxyCODONE-acetaminophen (PERCOCET/ROXICET) 5-325 MG tablet Take 1 tablet by mouth every 4 (four) hours as needed for severe pain. 04/14/17  Yes Jola Schmidt, MD  polyethylene glycol powder (MIRALAX) powder Take 17 g by mouth daily.   Yes [provider]  QUEtiapine (SEROQUEL) 200 MG tablet Take 200 mg by mouth at bedtime.   Yes [provider]  QUEtiapine (SEROQUEL) 50 MG tablet Take 1 tablet (50 mg total) by mouth at bedtime. 04/14/17  Yes Patrecia Pour, NP  senna (SENOKOT) 8.6 MG TABS tablet Take 3 tablets by mouth daily.   Yes [provider]  oxyCODONE-acetaminophen (ROXICET) 5-325 MG tablet Take 1-2 tablets by mouth every 6 (six) hours as needed for moderate pain or severe pain. 06/14/17   Forde Dandy, MD    Family History Family History  Problem Relation Age of Onset  . Cancer Sister        brain tumor  . Hypertension Daughter   . Kidney disease Sister   . Hypertension Brother     Social History Social History  Substance Use Topics  . Smoking status: Never Smoker  . Smokeless tobacco: Never Used  . Alcohol use No     Comment: rarely     Allergies   Gluten   Review of Systems Review of Systems  Constitutional: Negative for appetite change and fatigue.  HENT: Negative for congestion, ear discharge and sinus pressure.   Eyes: Negative for discharge.  Respiratory: Negative for cough.   Cardiovascular: Negative for chest pain.  Gastrointestinal: Negative for abdominal pain and diarrhea.  Genitourinary: Negative for frequency and hematuria.   Musculoskeletal: Negative for back pain.       Right hip pain  Skin: Negative for rash.  Neurological: Negative for seizures and headaches.  Psychiatric/Behavioral: Negative for hallucinations.     Physical Exam Updated Vital Signs BP (!) 175/76   Pulse 76   Temp 97.6 F (36.4 C) (Oral)   Resp 16   Ht 4\' 11"  (1.499 m)   Wt 40.8 kg (90 lb)   SpO2 100%   BMI 18.18 kg/m   Physical Exam  Constitutional: She is oriented to person, place, and time. She appears well-developed.  HENT:  Head: Normocephalic.  Eyes: Conjunctivae and EOM are normal. No scleral icterus.  Neck: Neck supple. No thyromegaly present.  Cardiovascular: Normal rate and regular rhythm.  Exam reveals no gallop and no friction rub.   No murmur heard. Pulmonary/Chest: No stridor. She has no wheezes. She has no rales. She exhibits no tenderness.  Abdominal: She exhibits no distension. There is no tenderness. There is no rebound.  Musculoskeletal: Normal range of motion. She exhibits tenderness. She exhibits  no edema.  Mild tender right hip  Lymphadenopathy:    She has no cervical adenopathy.  Neurological: She is oriented to person, place, and time. She exhibits normal muscle tone. Coordination normal.  Skin: No rash noted. No erythema.  Psychiatric: She has a normal mood and affect. Her behavior is normal.     ED Treatments / Results  Labs (all labs ordered are listed, but only abnormal results are displayed) Labs Reviewed - No data to display  EKG  EKG Interpretation None       Radiology Dg Elbow Complete Left  Result Date: 06/18/2017 CLINICAL DATA:  Left elbow pain.  The patient was found down. EXAM: LEFT ELBOW - COMPLETE 3+ VIEW COMPARISON:  None. FINDINGS: There is no evidence of fracture, dislocation, or joint effusion. There is no evidence of arthropathy or other focal bone abnormality. Diffuse osteopenia. Soft tissues are unremarkable. IMPRESSION: Negative. Electronically Signed   By: Lorriane Shire M.D.   On: 06/18/2017 14:36   Ct Head Wo Contrast  Result Date: 06/18/2017 CLINICAL DATA:  Unwitnessed fall.  Neck stiffness and neck pain. EXAM: CT HEAD WITHOUT CONTRAST CT CERVICAL SPINE WITHOUT CONTRAST TECHNIQUE: Multidetector CT imaging of the head and cervical spine was performed following the standard protocol without intravenous contrast. Multiplanar CT image reconstructions of the cervical spine were also generated. COMPARISON:  06/08/2017 FINDINGS: CT HEAD FINDINGS Brain: Extensive global atrophy. Chronic ischemic changes in the periventricular white matter. Lacune infarct in the left caudate head. No mass effect, midline shift, or acute hemorrhage. Vascular: No hyperdense vessel or unexpected calcification. Skull: Cranium is intact. Sinuses/Orbits: No acute finding. Other: None. CT CERVICAL SPINE FINDINGS Alignment: Stable compare with the prior study. Skull base and vertebrae: Subtle superior endplate fracture at T2 is suspected. There is otherwise no evidence of fracture or dislocation. There was slight wedging of T2 on the prior study, but it has progressed on today's study. Soft tissues and spinal canal: No spinal hematoma. No obvious soft tissue hematoma. Asymmetric fullness of the right sternocleidomastoid muscle is a stable finding. Disc levels: There is disc space narrowing at C4-5, C5-6, and C6-7. These findings are stable. There is some posterior osteophytic ridging at C5-6 and C6-7. Upper chest: No acute process. Other: Noncontributory. IMPRESSION: Head: Global atrophy and chronic ischemic changes. No acute intracranial pathology. Cervical spine: T2 compression fracture as described. Age is indeterminate but it is progressed compared to the prior study suggesting acute versus subacute age. Chronic changes are noted. Electronically Signed   By: Marybelle Killings M.D.   On: 06/18/2017 14:44   Ct Cervical Spine Wo Contrast  Result Date: 06/18/2017 CLINICAL DATA:  Unwitnessed fall.  Neck  stiffness and neck pain. EXAM: CT HEAD WITHOUT CONTRAST CT CERVICAL SPINE WITHOUT CONTRAST TECHNIQUE: Multidetector CT imaging of the head and cervical spine was performed following the standard protocol without intravenous contrast. Multiplanar CT image reconstructions of the cervical spine were also generated. COMPARISON:  06/08/2017 FINDINGS: CT HEAD FINDINGS Brain: Extensive global atrophy. Chronic ischemic changes in the periventricular white matter. Lacune infarct in the left caudate head. No mass effect, midline shift, or acute hemorrhage. Vascular: No hyperdense vessel or unexpected calcification. Skull: Cranium is intact. Sinuses/Orbits: No acute finding. Other: None. CT CERVICAL SPINE FINDINGS Alignment: Stable compare with the prior study. Skull base and vertebrae: Subtle superior endplate fracture at T2 is suspected. There is otherwise no evidence of fracture or dislocation. There was slight wedging of T2 on the prior study, but it  has progressed on today's study. Soft tissues and spinal canal: No spinal hematoma. No obvious soft tissue hematoma. Asymmetric fullness of the right sternocleidomastoid muscle is a stable finding. Disc levels: There is disc space narrowing at C4-5, C5-6, and C6-7. These findings are stable. There is some posterior osteophytic ridging at C5-6 and C6-7. Upper chest: No acute process. Other: Noncontributory. IMPRESSION: Head: Global atrophy and chronic ischemic changes. No acute intracranial pathology. Cervical spine: T2 compression fracture as described. Age is indeterminate but it is progressed compared to the prior study suggesting acute versus subacute age. Chronic changes are noted. Electronically Signed   By: Marybelle Killings M.D.   On: 06/18/2017 14:44   Dg Hip Unilat W Or Wo Pelvis 2-3 Views Right  Result Date: 06/18/2017 CLINICAL DATA:  Right hip pain. The patient was found down. EXAM: DG HIP (WITH OR WITHOUT PELVIS) 2-3V RIGHT COMPARISON:  None. FINDINGS: There is a  comminuted avulsion fracture of the superior aspect of the right greater trochanter. Femoral head and neck are intact. Fracture does not appear to extend into the intertrochanteric region. Possible fracture through the right ischial tuberosity. This is not definitive. Diffuse osteopenia. Old deformity of the right ilium, probably the result of remote fracture. Left hip prosthesis in place. IMPRESSION: 1. Avulsion fracture of the superior aspect of the right greater trochanter. 2. Possible avulsion fracture of the right ischial tuberosity. 3. Probable chronic deformity of the right ilium. Electronically Signed   By: Lorriane Shire M.D.   On: 06/18/2017 14:40    Procedures Procedures (including critical care time)  Medications Ordered in ED Medications  Tdap (BOOSTRIX) injection 0.5 mL (0.5 mLs Intramuscular Given 06/18/17 1543)  oxyCODONE-acetaminophen (PERCOCET/ROXICET) 5-325 MG per tablet 1 tablet (1 tablet Oral Given 06/18/17 1542)     Initial Impression / Assessment and Plan / ED Course  I have reviewed the triage vital signs and the nursing notes.  Pertinent labs & imaging results that were available during my care of the patient were reviewed by me and considered in my medical decision making (see chart for details).   patient with possible avulsion fracture of right history tuberosity. She is already under the care of orthopedic surgeon with conservative treatment for the right greater trochanter avulsion. She will continue with ambulation with walker only and assistance to follow-up with orthopedics    Final Clinical Impressions(s) / ED Diagnoses   Final diagnoses:  Fall, initial encounter    New Prescriptions Discharge Medication List as of 06/18/2017  3:28 PM       Milton Ferguson, MD 06/20/17 [place]    Milton Ferguson, MD 07/29/17 [phone]

## 2017-06-21 DIAGNOSIS — E46 Unspecified protein-calorie malnutrition: Secondary | ICD-10-CM | POA: Diagnosis not present

## 2017-06-21 DIAGNOSIS — R627 Adult failure to thrive: Secondary | ICD-10-CM | POA: Diagnosis not present

## 2017-06-23 DIAGNOSIS — R1310 Dysphagia, unspecified: Secondary | ICD-10-CM | POA: Diagnosis not present

## 2017-06-23 DIAGNOSIS — J439 Emphysema, unspecified: Secondary | ICD-10-CM | POA: Diagnosis not present

## 2017-06-23 DIAGNOSIS — S0101XD Laceration without foreign body of scalp, subsequent encounter: Secondary | ICD-10-CM | POA: Diagnosis not present

## 2017-06-23 DIAGNOSIS — F0390 Unspecified dementia without behavioral disturbance: Secondary | ICD-10-CM | POA: Diagnosis not present

## 2017-06-23 DIAGNOSIS — S72111D Displaced fracture of greater trochanter of right femur, subsequent encounter for closed fracture with routine healing: Secondary | ICD-10-CM | POA: Diagnosis not present

## 2017-06-23 DIAGNOSIS — Z4802 Encounter for removal of sutures: Secondary | ICD-10-CM | POA: Diagnosis not present

## 2017-06-24 DIAGNOSIS — S0101XD Laceration without foreign body of scalp, subsequent encounter: Secondary | ICD-10-CM | POA: Diagnosis not present

## 2017-06-24 DIAGNOSIS — J439 Emphysema, unspecified: Secondary | ICD-10-CM | POA: Diagnosis not present

## 2017-06-24 DIAGNOSIS — F0390 Unspecified dementia without behavioral disturbance: Secondary | ICD-10-CM | POA: Diagnosis not present

## 2017-06-24 DIAGNOSIS — S72111D Displaced fracture of greater trochanter of right femur, subsequent encounter for closed fracture with routine healing: Secondary | ICD-10-CM | POA: Diagnosis not present

## 2017-06-24 DIAGNOSIS — R1310 Dysphagia, unspecified: Secondary | ICD-10-CM | POA: Diagnosis not present

## 2017-06-24 DIAGNOSIS — Z4802 Encounter for removal of sutures: Secondary | ICD-10-CM | POA: Diagnosis not present

## 2017-06-25 DIAGNOSIS — S72111D Displaced fracture of greater trochanter of right femur, subsequent encounter for closed fracture with routine healing: Secondary | ICD-10-CM | POA: Diagnosis not present

## 2017-06-25 DIAGNOSIS — R1310 Dysphagia, unspecified: Secondary | ICD-10-CM | POA: Diagnosis not present

## 2017-06-25 DIAGNOSIS — J439 Emphysema, unspecified: Secondary | ICD-10-CM | POA: Diagnosis not present

## 2017-06-25 DIAGNOSIS — F0390 Unspecified dementia without behavioral disturbance: Secondary | ICD-10-CM | POA: Diagnosis not present

## 2017-06-25 DIAGNOSIS — S0101XD Laceration without foreign body of scalp, subsequent encounter: Secondary | ICD-10-CM | POA: Diagnosis not present

## 2017-06-25 DIAGNOSIS — Z4802 Encounter for removal of sutures: Secondary | ICD-10-CM | POA: Diagnosis not present

## 2017-06-26 ENCOUNTER — Other Ambulatory Visit (HOSPITAL_COMMUNITY)
Admission: RE | Admit: 2017-06-26 | Discharge: 2017-06-26 | Disposition: A | Discharge status: Home / Self Care | Payer: Medicare Other | Source: Other Acute Inpatient Hospital | Attending: Dermatology | Admitting: Dermatology

## 2017-06-26 DIAGNOSIS — Z4802 Encounter for removal of sutures: Secondary | ICD-10-CM | POA: Diagnosis not present

## 2017-06-26 DIAGNOSIS — N390 Urinary tract infection, site not specified: Secondary | ICD-10-CM | POA: Insufficient documentation

## 2017-06-26 DIAGNOSIS — S72111D Displaced fracture of greater trochanter of right femur, subsequent encounter for closed fracture with routine healing: Secondary | ICD-10-CM | POA: Diagnosis not present

## 2017-06-26 DIAGNOSIS — S0101XD Laceration without foreign body of scalp, subsequent encounter: Secondary | ICD-10-CM | POA: Diagnosis not present

## 2017-06-26 DIAGNOSIS — R1310 Dysphagia, unspecified: Secondary | ICD-10-CM | POA: Diagnosis not present

## 2017-06-26 DIAGNOSIS — J439 Emphysema, unspecified: Secondary | ICD-10-CM | POA: Diagnosis not present

## 2017-06-26 DIAGNOSIS — F0390 Unspecified dementia without behavioral disturbance: Secondary | ICD-10-CM | POA: Diagnosis not present

## 2017-06-26 LAB — URINALYSIS, ROUTINE W REFLEX MICROSCOPIC
Bilirubin Urine: NEGATIVE
GLUCOSE, UA: NEGATIVE mg/dL
Hgb urine dipstick: NEGATIVE
KETONES UR: NEGATIVE mg/dL
LEUKOCYTES UA: NEGATIVE
Nitrite: NEGATIVE
Protein, ur: NEGATIVE mg/dL
SPECIFIC GRAVITY, URINE: 1.02 (ref 1.005–1.030)
pH: 7 (ref 5.0–8.0)

## 2017-06-27 DIAGNOSIS — J439 Emphysema, unspecified: Secondary | ICD-10-CM | POA: Diagnosis not present

## 2017-06-27 DIAGNOSIS — S0101XD Laceration without foreign body of scalp, subsequent encounter: Secondary | ICD-10-CM | POA: Diagnosis not present

## 2017-06-27 DIAGNOSIS — Z4802 Encounter for removal of sutures: Secondary | ICD-10-CM | POA: Diagnosis not present

## 2017-06-27 DIAGNOSIS — S72111D Displaced fracture of greater trochanter of right femur, subsequent encounter for closed fracture with routine healing: Secondary | ICD-10-CM | POA: Diagnosis not present

## 2017-06-27 DIAGNOSIS — F0390 Unspecified dementia without behavioral disturbance: Secondary | ICD-10-CM | POA: Diagnosis not present

## 2017-06-27 DIAGNOSIS — R1310 Dysphagia, unspecified: Secondary | ICD-10-CM | POA: Diagnosis not present

## 2017-06-28 LAB — URINE CULTURE

## 2017-06-30 DIAGNOSIS — E039 Hypothyroidism, unspecified: Secondary | ICD-10-CM | POA: Diagnosis not present

## 2017-06-30 DIAGNOSIS — S0101XD Laceration without foreign body of scalp, subsequent encounter: Secondary | ICD-10-CM | POA: Diagnosis not present

## 2017-06-30 DIAGNOSIS — F0390 Unspecified dementia without behavioral disturbance: Secondary | ICD-10-CM | POA: Diagnosis not present

## 2017-06-30 DIAGNOSIS — Z4802 Encounter for removal of sutures: Secondary | ICD-10-CM | POA: Diagnosis not present

## 2017-06-30 DIAGNOSIS — I10 Essential (primary) hypertension: Secondary | ICD-10-CM | POA: Diagnosis not present

## 2017-06-30 DIAGNOSIS — J439 Emphysema, unspecified: Secondary | ICD-10-CM | POA: Diagnosis not present

## 2017-06-30 DIAGNOSIS — D509 Iron deficiency anemia, unspecified: Secondary | ICD-10-CM | POA: Diagnosis not present

## 2017-06-30 DIAGNOSIS — R1310 Dysphagia, unspecified: Secondary | ICD-10-CM | POA: Diagnosis not present

## 2017-06-30 DIAGNOSIS — S72111D Displaced fracture of greater trochanter of right femur, subsequent encounter for closed fracture with routine healing: Secondary | ICD-10-CM | POA: Diagnosis not present

## 2017-07-01 DIAGNOSIS — F0390 Unspecified dementia without behavioral disturbance: Secondary | ICD-10-CM | POA: Diagnosis not present

## 2017-07-01 DIAGNOSIS — Z4802 Encounter for removal of sutures: Secondary | ICD-10-CM | POA: Diagnosis not present

## 2017-07-01 DIAGNOSIS — S0101XD Laceration without foreign body of scalp, subsequent encounter: Secondary | ICD-10-CM | POA: Diagnosis not present

## 2017-07-01 DIAGNOSIS — J439 Emphysema, unspecified: Secondary | ICD-10-CM | POA: Diagnosis not present

## 2017-07-01 DIAGNOSIS — S72111D Displaced fracture of greater trochanter of right femur, subsequent encounter for closed fracture with routine healing: Secondary | ICD-10-CM | POA: Diagnosis not present

## 2017-07-01 DIAGNOSIS — R1310 Dysphagia, unspecified: Secondary | ICD-10-CM | POA: Diagnosis not present

## 2017-07-02 DIAGNOSIS — S72111D Displaced fracture of greater trochanter of right femur, subsequent encounter for closed fracture with routine healing: Secondary | ICD-10-CM | POA: Diagnosis not present

## 2017-07-02 DIAGNOSIS — J439 Emphysema, unspecified: Secondary | ICD-10-CM | POA: Diagnosis not present

## 2017-07-02 DIAGNOSIS — Z4802 Encounter for removal of sutures: Secondary | ICD-10-CM | POA: Diagnosis not present

## 2017-07-02 DIAGNOSIS — S0101XD Laceration without foreign body of scalp, subsequent encounter: Secondary | ICD-10-CM | POA: Diagnosis not present

## 2017-07-02 DIAGNOSIS — F0390 Unspecified dementia without behavioral disturbance: Secondary | ICD-10-CM | POA: Diagnosis not present

## 2017-07-02 DIAGNOSIS — R1310 Dysphagia, unspecified: Secondary | ICD-10-CM | POA: Diagnosis not present

## 2017-07-03 DIAGNOSIS — F0390 Unspecified dementia without behavioral disturbance: Secondary | ICD-10-CM | POA: Diagnosis not present

## 2017-07-03 DIAGNOSIS — S72111D Displaced fracture of greater trochanter of right femur, subsequent encounter for closed fracture with routine healing: Secondary | ICD-10-CM | POA: Diagnosis not present

## 2017-07-03 DIAGNOSIS — Z4802 Encounter for removal of sutures: Secondary | ICD-10-CM | POA: Diagnosis not present

## 2017-07-03 DIAGNOSIS — R1310 Dysphagia, unspecified: Secondary | ICD-10-CM | POA: Diagnosis not present

## 2017-07-03 DIAGNOSIS — S0101XD Laceration without foreign body of scalp, subsequent encounter: Secondary | ICD-10-CM | POA: Diagnosis not present

## 2017-07-03 DIAGNOSIS — J439 Emphysema, unspecified: Secondary | ICD-10-CM | POA: Diagnosis not present

## 2017-07-04 DIAGNOSIS — Z4802 Encounter for removal of sutures: Secondary | ICD-10-CM | POA: Diagnosis not present

## 2017-07-04 DIAGNOSIS — S72111D Displaced fracture of greater trochanter of right femur, subsequent encounter for closed fracture with routine healing: Secondary | ICD-10-CM | POA: Diagnosis not present

## 2017-07-04 DIAGNOSIS — J439 Emphysema, unspecified: Secondary | ICD-10-CM | POA: Diagnosis not present

## 2017-07-04 DIAGNOSIS — F0390 Unspecified dementia without behavioral disturbance: Secondary | ICD-10-CM | POA: Diagnosis not present

## 2017-07-04 DIAGNOSIS — R1310 Dysphagia, unspecified: Secondary | ICD-10-CM | POA: Diagnosis not present

## 2017-07-04 DIAGNOSIS — S0101XD Laceration without foreign body of scalp, subsequent encounter: Secondary | ICD-10-CM | POA: Diagnosis not present

## 2017-07-07 ENCOUNTER — Other Ambulatory Visit (HOSPITAL_COMMUNITY)
Admission: RE | Admit: 2017-07-07 | Discharge: 2017-07-07 | Disposition: A | Discharge status: Home / Self Care | Payer: Medicare Other | Source: Other Acute Inpatient Hospital | Attending: Internal Medicine | Admitting: Internal Medicine

## 2017-07-07 DIAGNOSIS — J439 Emphysema, unspecified: Secondary | ICD-10-CM | POA: Diagnosis not present

## 2017-07-07 DIAGNOSIS — S0101XD Laceration without foreign body of scalp, subsequent encounter: Secondary | ICD-10-CM | POA: Diagnosis not present

## 2017-07-07 DIAGNOSIS — F0390 Unspecified dementia without behavioral disturbance: Secondary | ICD-10-CM | POA: Diagnosis not present

## 2017-07-07 DIAGNOSIS — Z4802 Encounter for removal of sutures: Secondary | ICD-10-CM | POA: Diagnosis not present

## 2017-07-07 DIAGNOSIS — N390 Urinary tract infection, site not specified: Secondary | ICD-10-CM | POA: Insufficient documentation

## 2017-07-07 DIAGNOSIS — R1310 Dysphagia, unspecified: Secondary | ICD-10-CM | POA: Diagnosis not present

## 2017-07-07 DIAGNOSIS — S72111D Displaced fracture of greater trochanter of right femur, subsequent encounter for closed fracture with routine healing: Secondary | ICD-10-CM | POA: Diagnosis not present

## 2017-07-07 LAB — URINALYSIS, ROUTINE W REFLEX MICROSCOPIC
Bilirubin Urine: NEGATIVE
GLUCOSE, UA: NEGATIVE mg/dL
HGB URINE DIPSTICK: NEGATIVE
KETONES UR: NEGATIVE mg/dL
LEUKOCYTES UA: NEGATIVE
Nitrite: NEGATIVE
PROTEIN: NEGATIVE mg/dL
Specific Gravity, Urine: 1.017 (ref 1.005–1.030)
pH: 6 (ref 5.0–8.0)

## 2017-07-08 DIAGNOSIS — S72111D Displaced fracture of greater trochanter of right femur, subsequent encounter for closed fracture with routine healing: Secondary | ICD-10-CM | POA: Diagnosis not present

## 2017-07-08 DIAGNOSIS — Z4802 Encounter for removal of sutures: Secondary | ICD-10-CM | POA: Diagnosis not present

## 2017-07-08 DIAGNOSIS — J439 Emphysema, unspecified: Secondary | ICD-10-CM | POA: Diagnosis not present

## 2017-07-08 DIAGNOSIS — F0390 Unspecified dementia without behavioral disturbance: Secondary | ICD-10-CM | POA: Diagnosis not present

## 2017-07-08 DIAGNOSIS — S0101XD Laceration without foreign body of scalp, subsequent encounter: Secondary | ICD-10-CM | POA: Diagnosis not present

## 2017-07-08 DIAGNOSIS — R1310 Dysphagia, unspecified: Secondary | ICD-10-CM | POA: Diagnosis not present

## 2017-07-09 DIAGNOSIS — F0390 Unspecified dementia without behavioral disturbance: Secondary | ICD-10-CM | POA: Diagnosis not present

## 2017-07-09 DIAGNOSIS — S0101XD Laceration without foreign body of scalp, subsequent encounter: Secondary | ICD-10-CM | POA: Diagnosis not present

## 2017-07-09 DIAGNOSIS — Z4802 Encounter for removal of sutures: Secondary | ICD-10-CM | POA: Diagnosis not present

## 2017-07-09 DIAGNOSIS — S72111D Displaced fracture of greater trochanter of right femur, subsequent encounter for closed fracture with routine healing: Secondary | ICD-10-CM | POA: Diagnosis not present

## 2017-07-09 DIAGNOSIS — R1310 Dysphagia, unspecified: Secondary | ICD-10-CM | POA: Diagnosis not present

## 2017-07-09 DIAGNOSIS — J439 Emphysema, unspecified: Secondary | ICD-10-CM | POA: Diagnosis not present

## 2017-07-09 LAB — URINE CULTURE

## 2017-07-10 DIAGNOSIS — L89899 Pressure ulcer of other site, unspecified stage: Secondary | ICD-10-CM | POA: Diagnosis not present

## 2017-07-10 DIAGNOSIS — E039 Hypothyroidism, unspecified: Secondary | ICD-10-CM | POA: Diagnosis not present

## 2017-07-10 DIAGNOSIS — M545 Low back pain: Secondary | ICD-10-CM | POA: Diagnosis not present

## 2017-07-10 DIAGNOSIS — Z0001 Encounter for general adult medical examination with abnormal findings: Secondary | ICD-10-CM | POA: Diagnosis not present

## 2017-07-10 DIAGNOSIS — R627 Adult failure to thrive: Secondary | ICD-10-CM | POA: Diagnosis not present

## 2017-07-11 DIAGNOSIS — J439 Emphysema, unspecified: Secondary | ICD-10-CM | POA: Diagnosis not present

## 2017-07-11 DIAGNOSIS — Z4802 Encounter for removal of sutures: Secondary | ICD-10-CM | POA: Diagnosis not present

## 2017-07-11 DIAGNOSIS — F0390 Unspecified dementia without behavioral disturbance: Secondary | ICD-10-CM | POA: Diagnosis not present

## 2017-07-11 DIAGNOSIS — S72111D Displaced fracture of greater trochanter of right femur, subsequent encounter for closed fracture with routine healing: Secondary | ICD-10-CM | POA: Diagnosis not present

## 2017-07-11 DIAGNOSIS — S0101XD Laceration without foreign body of scalp, subsequent encounter: Secondary | ICD-10-CM | POA: Diagnosis not present

## 2017-07-11 DIAGNOSIS — R1310 Dysphagia, unspecified: Secondary | ICD-10-CM | POA: Diagnosis not present

## 2017-07-15 DIAGNOSIS — R1310 Dysphagia, unspecified: Secondary | ICD-10-CM | POA: Diagnosis not present

## 2017-07-15 DIAGNOSIS — E782 Mixed hyperlipidemia: Secondary | ICD-10-CM | POA: Diagnosis not present

## 2017-07-15 DIAGNOSIS — K219 Gastro-esophageal reflux disease without esophagitis: Secondary | ICD-10-CM | POA: Diagnosis not present

## 2017-07-15 DIAGNOSIS — E46 Unspecified protein-calorie malnutrition: Secondary | ICD-10-CM | POA: Diagnosis not present

## 2017-07-15 DIAGNOSIS — E039 Hypothyroidism, unspecified: Secondary | ICD-10-CM | POA: Diagnosis not present

## 2017-07-15 DIAGNOSIS — K900 Celiac disease: Secondary | ICD-10-CM | POA: Diagnosis not present

## 2017-07-15 DIAGNOSIS — I10 Essential (primary) hypertension: Secondary | ICD-10-CM | POA: Diagnosis not present

## 2017-07-15 DIAGNOSIS — I4891 Unspecified atrial fibrillation: Secondary | ICD-10-CM | POA: Diagnosis not present

## 2017-07-15 DIAGNOSIS — R630 Anorexia: Secondary | ICD-10-CM | POA: Diagnosis not present

## 2017-07-15 DIAGNOSIS — R531 Weakness: Secondary | ICD-10-CM | POA: Diagnosis not present

## 2017-07-15 DIAGNOSIS — D509 Iron deficiency anemia, unspecified: Secondary | ICD-10-CM | POA: Diagnosis not present

## 2017-07-16 DIAGNOSIS — K900 Celiac disease: Secondary | ICD-10-CM | POA: Diagnosis not present

## 2017-07-16 DIAGNOSIS — R531 Weakness: Secondary | ICD-10-CM | POA: Diagnosis not present

## 2017-07-16 DIAGNOSIS — I4891 Unspecified atrial fibrillation: Secondary | ICD-10-CM | POA: Diagnosis not present

## 2017-07-16 DIAGNOSIS — E039 Hypothyroidism, unspecified: Secondary | ICD-10-CM | POA: Diagnosis not present

## 2017-07-16 DIAGNOSIS — R1310 Dysphagia, unspecified: Secondary | ICD-10-CM | POA: Diagnosis not present

## 2017-07-16 DIAGNOSIS — E46 Unspecified protein-calorie malnutrition: Secondary | ICD-10-CM | POA: Diagnosis not present

## 2017-07-17 DIAGNOSIS — R531 Weakness: Secondary | ICD-10-CM | POA: Diagnosis not present

## 2017-07-17 DIAGNOSIS — R1310 Dysphagia, unspecified: Secondary | ICD-10-CM | POA: Diagnosis not present

## 2017-07-17 DIAGNOSIS — I4891 Unspecified atrial fibrillation: Secondary | ICD-10-CM | POA: Diagnosis not present

## 2017-07-17 DIAGNOSIS — E039 Hypothyroidism, unspecified: Secondary | ICD-10-CM | POA: Diagnosis not present

## 2017-07-17 DIAGNOSIS — K900 Celiac disease: Secondary | ICD-10-CM | POA: Diagnosis not present

## 2017-07-17 DIAGNOSIS — E46 Unspecified protein-calorie malnutrition: Secondary | ICD-10-CM | POA: Diagnosis not present

## 2017-07-18 DIAGNOSIS — K900 Celiac disease: Secondary | ICD-10-CM | POA: Diagnosis not present

## 2017-07-18 DIAGNOSIS — R1310 Dysphagia, unspecified: Secondary | ICD-10-CM | POA: Diagnosis not present

## 2017-07-18 DIAGNOSIS — E46 Unspecified protein-calorie malnutrition: Secondary | ICD-10-CM | POA: Diagnosis not present

## 2017-07-18 DIAGNOSIS — R531 Weakness: Secondary | ICD-10-CM | POA: Diagnosis not present

## 2017-07-18 DIAGNOSIS — I4891 Unspecified atrial fibrillation: Secondary | ICD-10-CM | POA: Diagnosis not present

## 2017-07-18 DIAGNOSIS — E039 Hypothyroidism, unspecified: Secondary | ICD-10-CM | POA: Diagnosis not present

## 2017-07-21 DIAGNOSIS — E039 Hypothyroidism, unspecified: Secondary | ICD-10-CM | POA: Diagnosis not present

## 2017-07-21 DIAGNOSIS — E46 Unspecified protein-calorie malnutrition: Secondary | ICD-10-CM | POA: Diagnosis not present

## 2017-07-21 DIAGNOSIS — K900 Celiac disease: Secondary | ICD-10-CM | POA: Diagnosis not present

## 2017-07-21 DIAGNOSIS — I4891 Unspecified atrial fibrillation: Secondary | ICD-10-CM | POA: Diagnosis not present

## 2017-07-21 DIAGNOSIS — R1310 Dysphagia, unspecified: Secondary | ICD-10-CM | POA: Diagnosis not present

## 2017-07-21 DIAGNOSIS — R531 Weakness: Secondary | ICD-10-CM | POA: Diagnosis not present

## 2017-07-23 DIAGNOSIS — R531 Weakness: Secondary | ICD-10-CM | POA: Diagnosis not present

## 2017-07-23 DIAGNOSIS — R1310 Dysphagia, unspecified: Secondary | ICD-10-CM | POA: Diagnosis not present

## 2017-07-23 DIAGNOSIS — I4891 Unspecified atrial fibrillation: Secondary | ICD-10-CM | POA: Diagnosis not present

## 2017-07-23 DIAGNOSIS — E039 Hypothyroidism, unspecified: Secondary | ICD-10-CM | POA: Diagnosis not present

## 2017-07-23 DIAGNOSIS — K900 Celiac disease: Secondary | ICD-10-CM | POA: Diagnosis not present

## 2017-07-23 DIAGNOSIS — E46 Unspecified protein-calorie malnutrition: Secondary | ICD-10-CM | POA: Diagnosis not present

## 2017-07-24 DIAGNOSIS — R1310 Dysphagia, unspecified: Secondary | ICD-10-CM | POA: Diagnosis not present

## 2017-07-24 DIAGNOSIS — K900 Celiac disease: Secondary | ICD-10-CM | POA: Diagnosis not present

## 2017-07-24 DIAGNOSIS — R531 Weakness: Secondary | ICD-10-CM | POA: Diagnosis not present

## 2017-07-24 DIAGNOSIS — E039 Hypothyroidism, unspecified: Secondary | ICD-10-CM | POA: Diagnosis not present

## 2017-07-24 DIAGNOSIS — I4891 Unspecified atrial fibrillation: Secondary | ICD-10-CM | POA: Diagnosis not present

## 2017-07-24 DIAGNOSIS — E46 Unspecified protein-calorie malnutrition: Secondary | ICD-10-CM | POA: Diagnosis not present

## 2017-07-25 DIAGNOSIS — K900 Celiac disease: Secondary | ICD-10-CM | POA: Diagnosis not present

## 2017-07-25 DIAGNOSIS — E46 Unspecified protein-calorie malnutrition: Secondary | ICD-10-CM | POA: Diagnosis not present

## 2017-07-25 DIAGNOSIS — R531 Weakness: Secondary | ICD-10-CM | POA: Diagnosis not present

## 2017-07-25 DIAGNOSIS — I4891 Unspecified atrial fibrillation: Secondary | ICD-10-CM | POA: Diagnosis not present

## 2017-07-25 DIAGNOSIS — R1310 Dysphagia, unspecified: Secondary | ICD-10-CM | POA: Diagnosis not present

## 2017-07-25 DIAGNOSIS — E039 Hypothyroidism, unspecified: Secondary | ICD-10-CM | POA: Diagnosis not present

## 2017-07-28 DIAGNOSIS — I4891 Unspecified atrial fibrillation: Secondary | ICD-10-CM | POA: Diagnosis not present

## 2017-07-28 DIAGNOSIS — R531 Weakness: Secondary | ICD-10-CM | POA: Diagnosis not present

## 2017-07-28 DIAGNOSIS — E46 Unspecified protein-calorie malnutrition: Secondary | ICD-10-CM | POA: Diagnosis not present

## 2017-07-28 DIAGNOSIS — R1310 Dysphagia, unspecified: Secondary | ICD-10-CM | POA: Diagnosis not present

## 2017-07-28 DIAGNOSIS — K900 Celiac disease: Secondary | ICD-10-CM | POA: Diagnosis not present

## 2017-07-28 DIAGNOSIS — E039 Hypothyroidism, unspecified: Secondary | ICD-10-CM | POA: Diagnosis not present

## 2017-07-30 DIAGNOSIS — R1310 Dysphagia, unspecified: Secondary | ICD-10-CM | POA: Diagnosis not present

## 2017-07-30 DIAGNOSIS — I4891 Unspecified atrial fibrillation: Secondary | ICD-10-CM | POA: Diagnosis not present

## 2017-07-30 DIAGNOSIS — R531 Weakness: Secondary | ICD-10-CM | POA: Diagnosis not present

## 2017-07-30 DIAGNOSIS — K900 Celiac disease: Secondary | ICD-10-CM | POA: Diagnosis not present

## 2017-07-30 DIAGNOSIS — E46 Unspecified protein-calorie malnutrition: Secondary | ICD-10-CM | POA: Diagnosis not present

## 2017-07-30 DIAGNOSIS — E039 Hypothyroidism, unspecified: Secondary | ICD-10-CM | POA: Diagnosis not present

## 2017-07-31 DIAGNOSIS — R1310 Dysphagia, unspecified: Secondary | ICD-10-CM | POA: Diagnosis not present

## 2017-07-31 DIAGNOSIS — E039 Hypothyroidism, unspecified: Secondary | ICD-10-CM | POA: Diagnosis not present

## 2017-07-31 DIAGNOSIS — K900 Celiac disease: Secondary | ICD-10-CM | POA: Diagnosis not present

## 2017-07-31 DIAGNOSIS — E46 Unspecified protein-calorie malnutrition: Secondary | ICD-10-CM | POA: Diagnosis not present

## 2017-07-31 DIAGNOSIS — R531 Weakness: Secondary | ICD-10-CM | POA: Diagnosis not present

## 2017-07-31 DIAGNOSIS — I4891 Unspecified atrial fibrillation: Secondary | ICD-10-CM | POA: Diagnosis not present

## 2017-08-01 DIAGNOSIS — I4891 Unspecified atrial fibrillation: Secondary | ICD-10-CM | POA: Diagnosis not present

## 2017-08-01 DIAGNOSIS — E46 Unspecified protein-calorie malnutrition: Secondary | ICD-10-CM | POA: Diagnosis not present

## 2017-08-01 DIAGNOSIS — R1310 Dysphagia, unspecified: Secondary | ICD-10-CM | POA: Diagnosis not present

## 2017-08-01 DIAGNOSIS — E039 Hypothyroidism, unspecified: Secondary | ICD-10-CM | POA: Diagnosis not present

## 2017-08-01 DIAGNOSIS — R531 Weakness: Secondary | ICD-10-CM | POA: Diagnosis not present

## 2017-08-01 DIAGNOSIS — K900 Celiac disease: Secondary | ICD-10-CM | POA: Diagnosis not present

## 2017-08-04 DIAGNOSIS — E039 Hypothyroidism, unspecified: Secondary | ICD-10-CM | POA: Diagnosis not present

## 2017-08-04 DIAGNOSIS — E46 Unspecified protein-calorie malnutrition: Secondary | ICD-10-CM | POA: Diagnosis not present

## 2017-08-04 DIAGNOSIS — K900 Celiac disease: Secondary | ICD-10-CM | POA: Diagnosis not present

## 2017-08-04 DIAGNOSIS — R531 Weakness: Secondary | ICD-10-CM | POA: Diagnosis not present

## 2017-08-04 DIAGNOSIS — R1310 Dysphagia, unspecified: Secondary | ICD-10-CM | POA: Diagnosis not present

## 2017-08-04 DIAGNOSIS — I4891 Unspecified atrial fibrillation: Secondary | ICD-10-CM | POA: Diagnosis not present

## 2017-08-07 DIAGNOSIS — R1310 Dysphagia, unspecified: Secondary | ICD-10-CM | POA: Diagnosis not present

## 2017-08-07 DIAGNOSIS — E039 Hypothyroidism, unspecified: Secondary | ICD-10-CM | POA: Diagnosis not present

## 2017-08-07 DIAGNOSIS — K900 Celiac disease: Secondary | ICD-10-CM | POA: Diagnosis not present

## 2017-08-07 DIAGNOSIS — I4891 Unspecified atrial fibrillation: Secondary | ICD-10-CM | POA: Diagnosis not present

## 2017-08-07 DIAGNOSIS — R531 Weakness: Secondary | ICD-10-CM | POA: Diagnosis not present

## 2017-08-07 DIAGNOSIS — E46 Unspecified protein-calorie malnutrition: Secondary | ICD-10-CM | POA: Diagnosis not present

## 2017-08-08 DIAGNOSIS — K900 Celiac disease: Secondary | ICD-10-CM | POA: Diagnosis not present

## 2017-08-08 DIAGNOSIS — R531 Weakness: Secondary | ICD-10-CM | POA: Diagnosis not present

## 2017-08-08 DIAGNOSIS — E039 Hypothyroidism, unspecified: Secondary | ICD-10-CM | POA: Diagnosis not present

## 2017-08-08 DIAGNOSIS — I4891 Unspecified atrial fibrillation: Secondary | ICD-10-CM | POA: Diagnosis not present

## 2017-08-08 DIAGNOSIS — E46 Unspecified protein-calorie malnutrition: Secondary | ICD-10-CM | POA: Diagnosis not present

## 2017-08-08 DIAGNOSIS — R1310 Dysphagia, unspecified: Secondary | ICD-10-CM | POA: Diagnosis not present

## 2017-08-09 DIAGNOSIS — D509 Iron deficiency anemia, unspecified: Secondary | ICD-10-CM | POA: Diagnosis not present

## 2017-08-09 DIAGNOSIS — E46 Unspecified protein-calorie malnutrition: Secondary | ICD-10-CM | POA: Diagnosis not present

## 2017-08-09 DIAGNOSIS — K219 Gastro-esophageal reflux disease without esophagitis: Secondary | ICD-10-CM | POA: Diagnosis not present

## 2017-08-09 DIAGNOSIS — E039 Hypothyroidism, unspecified: Secondary | ICD-10-CM | POA: Diagnosis not present

## 2017-08-09 DIAGNOSIS — E782 Mixed hyperlipidemia: Secondary | ICD-10-CM | POA: Diagnosis not present

## 2017-08-09 DIAGNOSIS — I4891 Unspecified atrial fibrillation: Secondary | ICD-10-CM | POA: Diagnosis not present

## 2017-08-09 DIAGNOSIS — K900 Celiac disease: Secondary | ICD-10-CM | POA: Diagnosis not present

## 2017-08-09 DIAGNOSIS — I10 Essential (primary) hypertension: Secondary | ICD-10-CM | POA: Diagnosis not present

## 2017-08-09 DIAGNOSIS — R531 Weakness: Secondary | ICD-10-CM | POA: Diagnosis not present

## 2017-08-09 DIAGNOSIS — R630 Anorexia: Secondary | ICD-10-CM | POA: Diagnosis not present

## 2017-08-09 DIAGNOSIS — R1310 Dysphagia, unspecified: Secondary | ICD-10-CM | POA: Diagnosis not present

## 2017-08-12 DIAGNOSIS — E46 Unspecified protein-calorie malnutrition: Secondary | ICD-10-CM | POA: Diagnosis not present

## 2017-08-12 DIAGNOSIS — R531 Weakness: Secondary | ICD-10-CM | POA: Diagnosis not present

## 2017-08-12 DIAGNOSIS — R1310 Dysphagia, unspecified: Secondary | ICD-10-CM | POA: Diagnosis not present

## 2017-08-12 DIAGNOSIS — K900 Celiac disease: Secondary | ICD-10-CM | POA: Diagnosis not present

## 2017-08-12 DIAGNOSIS — E039 Hypothyroidism, unspecified: Secondary | ICD-10-CM | POA: Diagnosis not present

## 2017-08-12 DIAGNOSIS — I4891 Unspecified atrial fibrillation: Secondary | ICD-10-CM | POA: Diagnosis not present

## 2017-08-15 DIAGNOSIS — E039 Hypothyroidism, unspecified: Secondary | ICD-10-CM | POA: Diagnosis not present

## 2017-08-15 DIAGNOSIS — R531 Weakness: Secondary | ICD-10-CM | POA: Diagnosis not present

## 2017-08-15 DIAGNOSIS — K900 Celiac disease: Secondary | ICD-10-CM | POA: Diagnosis not present

## 2017-08-15 DIAGNOSIS — E46 Unspecified protein-calorie malnutrition: Secondary | ICD-10-CM | POA: Diagnosis not present

## 2017-08-15 DIAGNOSIS — I4891 Unspecified atrial fibrillation: Secondary | ICD-10-CM | POA: Diagnosis not present

## 2017-08-15 DIAGNOSIS — R1310 Dysphagia, unspecified: Secondary | ICD-10-CM | POA: Diagnosis not present

## 2017-08-18 DIAGNOSIS — R531 Weakness: Secondary | ICD-10-CM | POA: Diagnosis not present

## 2017-08-18 DIAGNOSIS — E039 Hypothyroidism, unspecified: Secondary | ICD-10-CM | POA: Diagnosis not present

## 2017-08-18 DIAGNOSIS — R1310 Dysphagia, unspecified: Secondary | ICD-10-CM | POA: Diagnosis not present

## 2017-08-18 DIAGNOSIS — I4891 Unspecified atrial fibrillation: Secondary | ICD-10-CM | POA: Diagnosis not present

## 2017-08-18 DIAGNOSIS — E46 Unspecified protein-calorie malnutrition: Secondary | ICD-10-CM | POA: Diagnosis not present

## 2017-08-18 DIAGNOSIS — K900 Celiac disease: Secondary | ICD-10-CM | POA: Diagnosis not present

## 2017-08-19 DIAGNOSIS — I4891 Unspecified atrial fibrillation: Secondary | ICD-10-CM | POA: Diagnosis not present

## 2017-08-19 DIAGNOSIS — K900 Celiac disease: Secondary | ICD-10-CM | POA: Diagnosis not present

## 2017-08-19 DIAGNOSIS — E46 Unspecified protein-calorie malnutrition: Secondary | ICD-10-CM | POA: Diagnosis not present

## 2017-08-19 DIAGNOSIS — R1310 Dysphagia, unspecified: Secondary | ICD-10-CM | POA: Diagnosis not present

## 2017-08-19 DIAGNOSIS — R531 Weakness: Secondary | ICD-10-CM | POA: Diagnosis not present

## 2017-08-19 DIAGNOSIS — E039 Hypothyroidism, unspecified: Secondary | ICD-10-CM | POA: Diagnosis not present

## 2017-08-20 DIAGNOSIS — R1310 Dysphagia, unspecified: Secondary | ICD-10-CM | POA: Diagnosis not present

## 2017-08-20 DIAGNOSIS — E039 Hypothyroidism, unspecified: Secondary | ICD-10-CM | POA: Diagnosis not present

## 2017-08-20 DIAGNOSIS — R531 Weakness: Secondary | ICD-10-CM | POA: Diagnosis not present

## 2017-08-20 DIAGNOSIS — I4891 Unspecified atrial fibrillation: Secondary | ICD-10-CM | POA: Diagnosis not present

## 2017-08-20 DIAGNOSIS — E46 Unspecified protein-calorie malnutrition: Secondary | ICD-10-CM | POA: Diagnosis not present

## 2017-08-20 DIAGNOSIS — K900 Celiac disease: Secondary | ICD-10-CM | POA: Diagnosis not present

## 2017-08-21 DIAGNOSIS — K900 Celiac disease: Secondary | ICD-10-CM | POA: Diagnosis not present

## 2017-08-21 DIAGNOSIS — R1310 Dysphagia, unspecified: Secondary | ICD-10-CM | POA: Diagnosis not present

## 2017-08-21 DIAGNOSIS — R531 Weakness: Secondary | ICD-10-CM | POA: Diagnosis not present

## 2017-08-21 DIAGNOSIS — E039 Hypothyroidism, unspecified: Secondary | ICD-10-CM | POA: Diagnosis not present

## 2017-08-21 DIAGNOSIS — I4891 Unspecified atrial fibrillation: Secondary | ICD-10-CM | POA: Diagnosis not present

## 2017-08-21 DIAGNOSIS — E46 Unspecified protein-calorie malnutrition: Secondary | ICD-10-CM | POA: Diagnosis not present

## 2017-08-22 DIAGNOSIS — E46 Unspecified protein-calorie malnutrition: Secondary | ICD-10-CM | POA: Diagnosis not present

## 2017-08-22 DIAGNOSIS — K900 Celiac disease: Secondary | ICD-10-CM | POA: Diagnosis not present

## 2017-08-22 DIAGNOSIS — I4891 Unspecified atrial fibrillation: Secondary | ICD-10-CM | POA: Diagnosis not present

## 2017-08-22 DIAGNOSIS — E039 Hypothyroidism, unspecified: Secondary | ICD-10-CM | POA: Diagnosis not present

## 2017-08-22 DIAGNOSIS — R531 Weakness: Secondary | ICD-10-CM | POA: Diagnosis not present

## 2017-08-22 DIAGNOSIS — R1310 Dysphagia, unspecified: Secondary | ICD-10-CM | POA: Diagnosis not present

## 2017-09-08 DEATH — deceased

## 2017-11-20 ENCOUNTER — Encounter: Payer: Self-pay | Admitting: Internal Medicine

## 2017-11-26 ENCOUNTER — Telehealth: Payer: Self-pay

## 2017-11-26 NOTE — Telephone Encounter (Signed)
FYI, Received a voice mail from pts son saying his mother passed away today.

## 2017-11-27 NOTE — Telephone Encounter (Signed)
Communication noted.  

## 2018-04-20 IMAGING — DX DG HIP (WITH OR WITHOUT PELVIS) 2-3V*R*
3 series · 3 of 3 positions shown · non-contrast
Comparison: None.

CLINICAL DATA: Right hip pain. The patient was found down.

EXAM:
DG HIP (WITH OR WITHOUT PELVIS) 2-3V RIGHT

[pelvis ap]
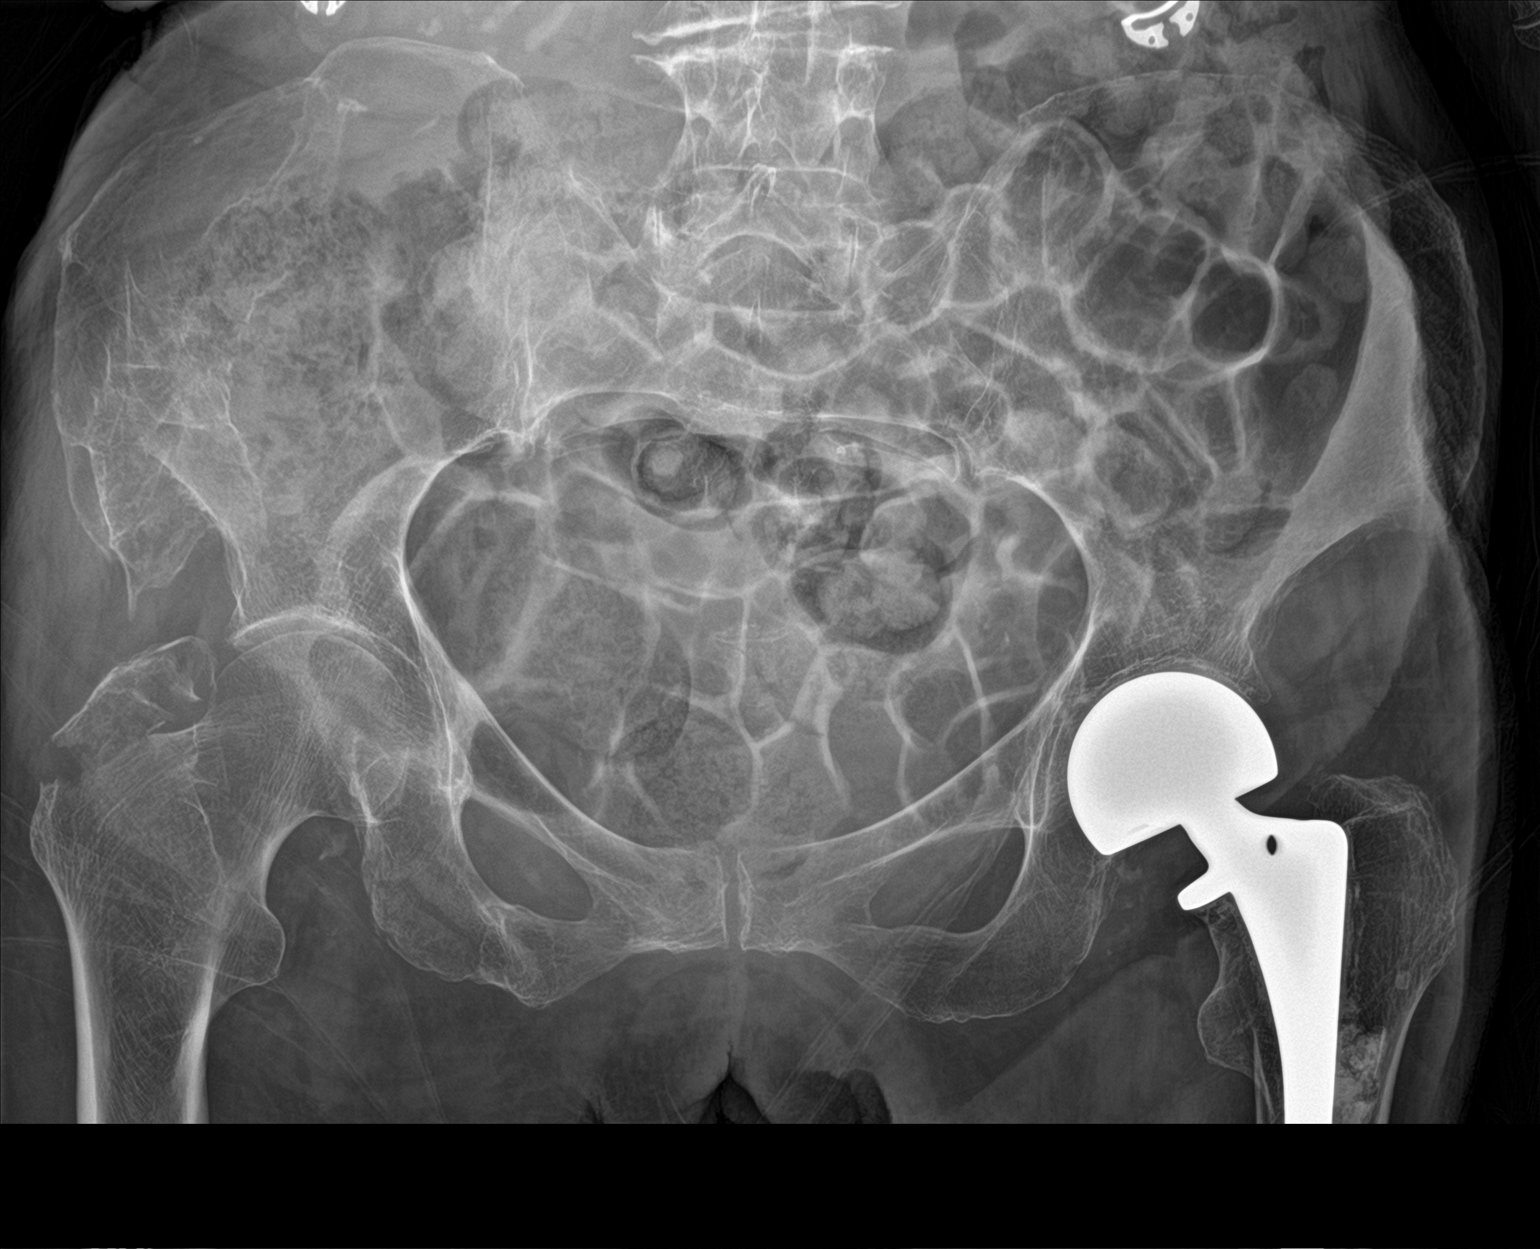

[hip ap]
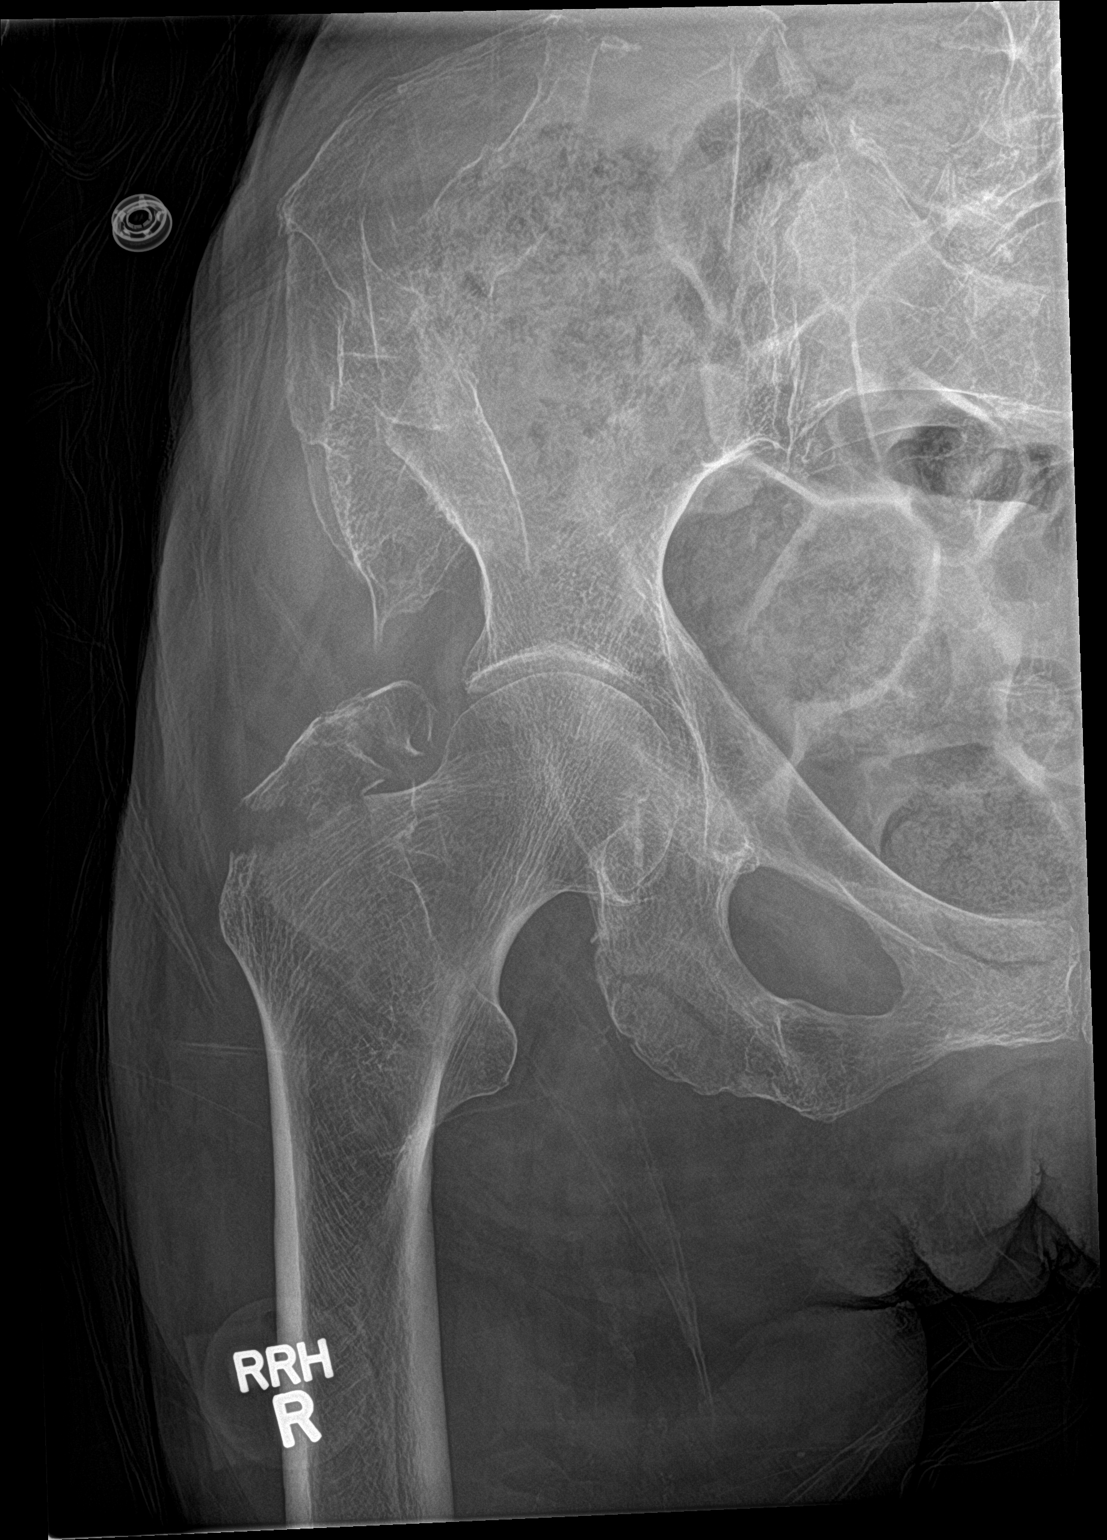

[hip lat]
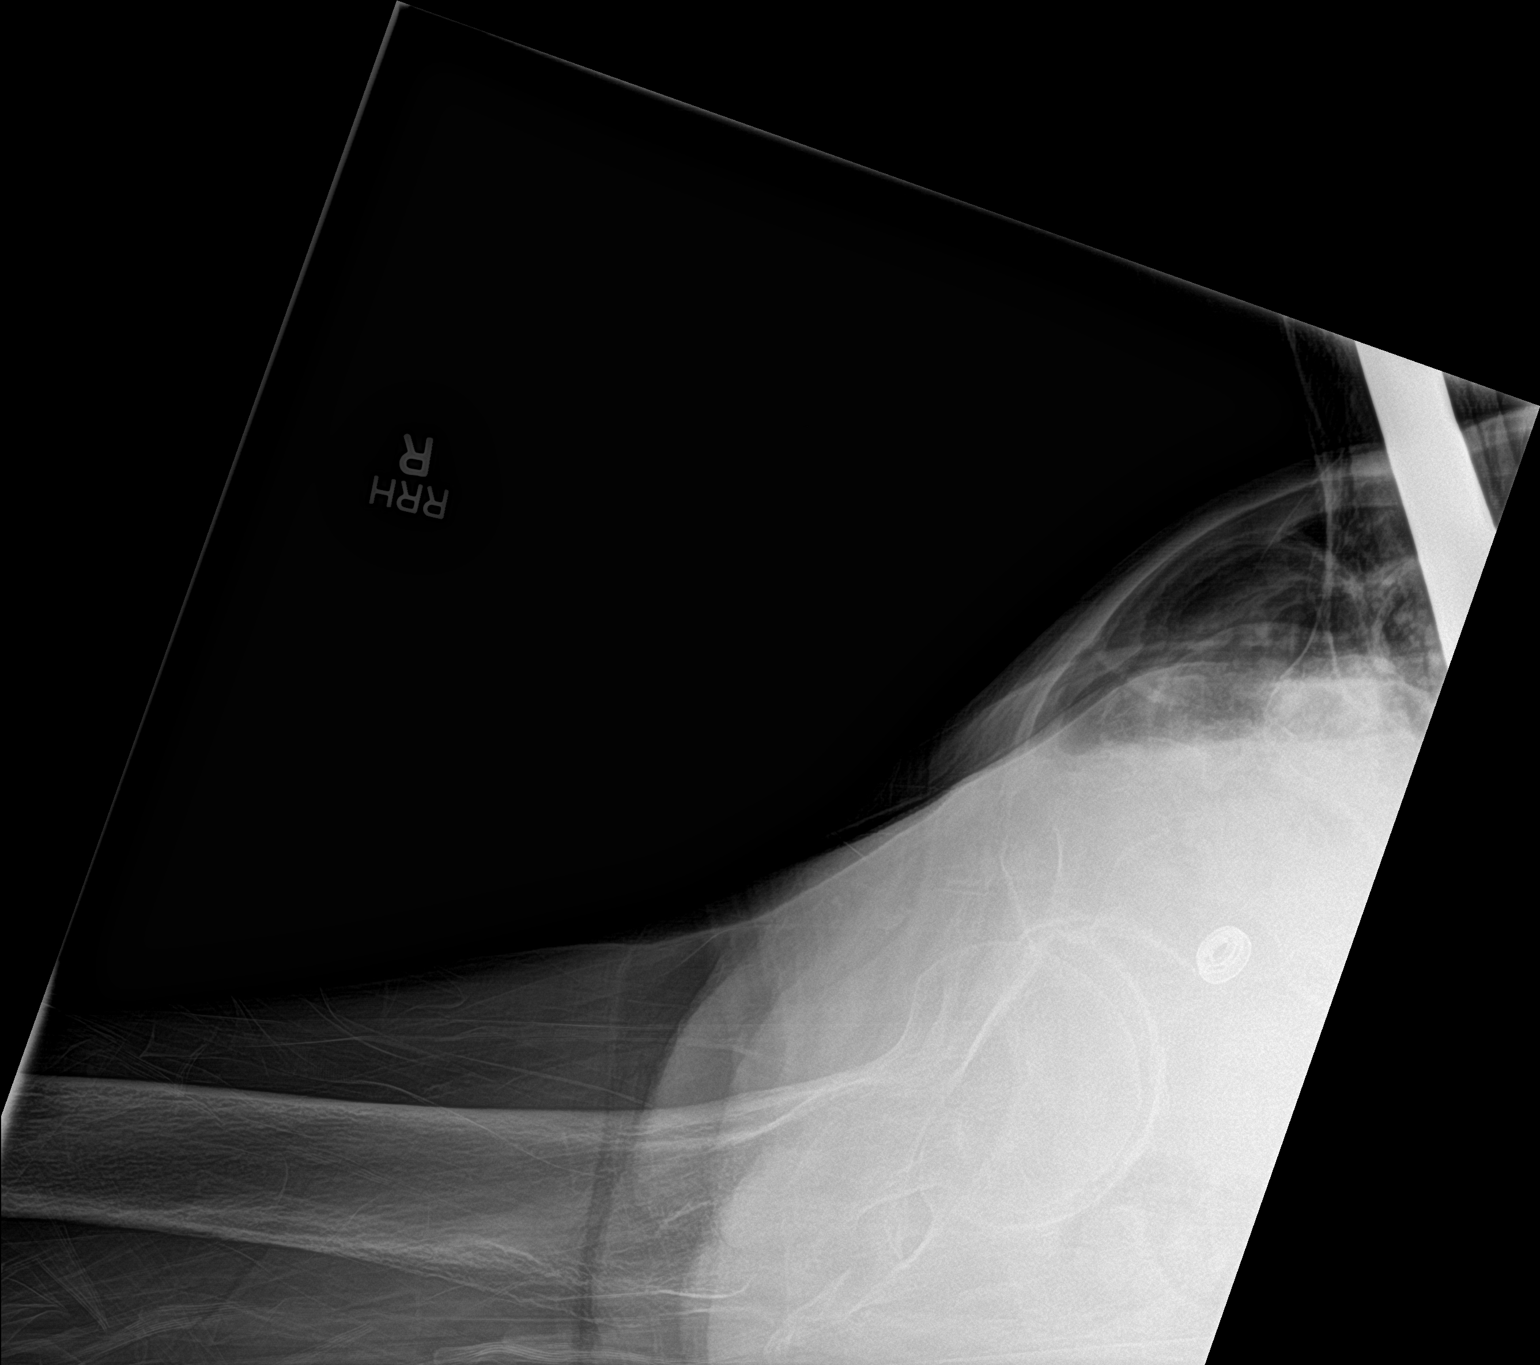

[3 of 3 positions shown; findings below may reference images not displayed]

FINDINGS: There is a comminuted avulsion fracture of the superior aspect of
the right greater trochanter. Femoral head and neck are intact.
Fracture does not appear to extend into the intertrochanteric
region.

Possible fracture through the right ischial tuberosity. This is not
definitive.

Diffuse osteopenia. Old deformity of the right ilium, probably the
result of remote fracture.

Left hip prosthesis in place.
IMPRESSION: 1. Avulsion fracture of the superior aspect of the right greater
trochanter.
2. Possible avulsion fracture of the right ischial tuberosity.
3. Probable chronic deformity of the right ilium.
